# Patient Record
Sex: Female | Born: 1950 | Race: Black or African American | Hispanic: No | Smoking: Former smoker
Health system: Southern US, Community
[De-identification: ages and names within clinical notes are randomized; demographics above are authoritative.]

## PROBLEM LIST (undated history)

## (undated) DIAGNOSIS — E785 Hyperlipidemia, unspecified: Secondary | ICD-10-CM

## (undated) DIAGNOSIS — J42 Unspecified chronic bronchitis: Secondary | ICD-10-CM

## (undated) DIAGNOSIS — B1920 Unspecified viral hepatitis C without hepatic coma: Secondary | ICD-10-CM

## (undated) DIAGNOSIS — I10 Essential (primary) hypertension: Secondary | ICD-10-CM

## (undated) HISTORY — PX: COLONOSCOPY: SHX174

## (undated) HISTORY — PX: BACK SURGERY: SHX140

## (undated) HISTORY — DX: Hyperlipidemia, unspecified: E78.5

## (undated) HISTORY — DX: Essential (primary) hypertension: I10

## (undated) HISTORY — PX: POSTERIOR LUMBAR FUSION: SHX6036

## (undated) HISTORY — PX: LUMBAR SPINE SURGERY: SHX701

---
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
## 1988-10-10 HISTORY — PX: ABDOMINAL HYSTERECTOMY: SHX81

## 1988-10-10 HISTORY — PX: CHOLECYSTECTOMY: SHX55

## 2000-04-06 ENCOUNTER — Other Ambulatory Visit: Admission: RE | Admit: 2000-04-06 | Discharge: 2000-04-06 | Payer: Self-pay | Admitting: Internal Medicine

## 2000-04-06 ENCOUNTER — Encounter (INDEPENDENT_AMBULATORY_CARE_PROVIDER_SITE_OTHER): Payer: Self-pay

## 2000-05-04 ENCOUNTER — Encounter: Payer: Self-pay | Admitting: Family Medicine

## 2000-05-04 ENCOUNTER — Encounter: Admission: RE | Admit: 2000-05-04 | Discharge: 2000-05-04 | Payer: Self-pay | Admitting: Family Medicine

## 2003-08-28 ENCOUNTER — Encounter: Admission: RE | Admit: 2003-08-28 | Discharge: 2003-08-28 | Payer: Self-pay | Admitting: Emergency Medicine

## 2003-11-26 ENCOUNTER — Encounter: Admission: RE | Admit: 2003-11-26 | Discharge: 2003-11-26 | Payer: Self-pay | Admitting: Gastroenterology

## 2003-12-29 ENCOUNTER — Ambulatory Visit (HOSPITAL_COMMUNITY): Admission: RE | Admit: 2003-12-29 | Discharge: 2003-12-29 | Payer: Self-pay | Admitting: Orthopedic Surgery

## 2004-01-08 ENCOUNTER — Encounter: Admission: RE | Admit: 2004-01-08 | Discharge: 2004-01-08 | Payer: Self-pay | Admitting: Orthopedic Surgery

## 2004-01-21 ENCOUNTER — Encounter: Admission: RE | Admit: 2004-01-21 | Discharge: 2004-01-21 | Payer: Self-pay | Admitting: Orthopedic Surgery

## 2004-03-12 ENCOUNTER — Inpatient Hospital Stay (HOSPITAL_COMMUNITY): Admission: RE | Admit: 2004-03-12 | Discharge: 2004-03-15 | Payer: Self-pay | Admitting: Neurosurgery

## 2004-04-25 ENCOUNTER — Encounter: Admission: RE | Admit: 2004-04-25 | Discharge: 2004-04-25 | Payer: Self-pay | Admitting: Neurosurgery

## 2004-07-11 ENCOUNTER — Encounter: Admission: RE | Admit: 2004-07-11 | Discharge: 2004-07-11 | Payer: Self-pay | Admitting: Neurosurgery

## 2004-07-25 ENCOUNTER — Encounter: Admission: RE | Admit: 2004-07-25 | Discharge: 2004-07-25 | Payer: Self-pay | Admitting: Neurosurgery

## 2004-08-26 ENCOUNTER — Encounter: Admission: RE | Admit: 2004-08-26 | Discharge: 2004-08-26 | Payer: Self-pay | Admitting: Neurosurgery

## 2004-10-22 ENCOUNTER — Inpatient Hospital Stay (HOSPITAL_COMMUNITY): Admission: RE | Admit: 2004-10-22 | Discharge: 2004-10-25 | Payer: Self-pay | Admitting: Neurosurgery

## 2004-12-04 ENCOUNTER — Encounter: Admission: RE | Admit: 2004-12-04 | Discharge: 2004-12-04 | Payer: Self-pay | Admitting: Neurosurgery

## 2005-03-21 ENCOUNTER — Encounter: Admission: RE | Admit: 2005-03-21 | Discharge: 2005-03-21 | Payer: Self-pay | Admitting: Neurosurgery

## 2005-04-25 ENCOUNTER — Emergency Department (HOSPITAL_COMMUNITY): Admission: AD | Admit: 2005-04-25 | Discharge: 2005-04-25 | Payer: Self-pay | Admitting: Family Medicine

## 2005-06-04 ENCOUNTER — Encounter: Admission: RE | Admit: 2005-06-04 | Discharge: 2005-06-04 | Payer: Self-pay | Admitting: Neurosurgery

## 2005-11-09 ENCOUNTER — Encounter: Admission: RE | Admit: 2005-11-09 | Discharge: 2005-11-09 | Payer: Self-pay | Admitting: Family Medicine

## 2005-11-17 ENCOUNTER — Encounter: Admission: RE | Admit: 2005-11-17 | Discharge: 2005-11-17 | Payer: Self-pay | Admitting: Family Medicine

## 2006-08-16 ENCOUNTER — Emergency Department (HOSPITAL_COMMUNITY): Admission: EM | Admit: 2006-08-16 | Discharge: 2006-08-16 | Payer: Self-pay | Admitting: Family Medicine

## 2006-11-08 ENCOUNTER — Encounter: Admission: RE | Admit: 2006-11-08 | Discharge: 2006-11-08 | Payer: Self-pay | Admitting: Neurosurgery

## 2006-11-11 ENCOUNTER — Encounter: Admission: RE | Admit: 2006-11-11 | Discharge: 2006-11-11 | Payer: Self-pay | Admitting: Emergency Medicine

## 2006-11-12 ENCOUNTER — Encounter: Admission: RE | Admit: 2006-11-12 | Discharge: 2006-11-12 | Payer: Self-pay | Admitting: Gastroenterology

## 2007-01-04 ENCOUNTER — Ambulatory Visit: Payer: Self-pay | Admitting: Gastroenterology

## 2007-01-17 ENCOUNTER — Encounter (INDEPENDENT_AMBULATORY_CARE_PROVIDER_SITE_OTHER): Payer: Self-pay | Admitting: Interventional Radiology

## 2007-01-17 ENCOUNTER — Ambulatory Visit (HOSPITAL_COMMUNITY): Admission: RE | Admit: 2007-01-17 | Discharge: 2007-01-17 | Payer: Self-pay | Admitting: Gastroenterology

## 2007-11-14 ENCOUNTER — Encounter: Admission: RE | Admit: 2007-11-14 | Discharge: 2007-11-14 | Payer: Self-pay | Admitting: Emergency Medicine

## 2007-11-15 ENCOUNTER — Emergency Department (HOSPITAL_COMMUNITY): Admission: EM | Admit: 2007-11-15 | Discharge: 2007-11-15 | Payer: Self-pay | Admitting: Emergency Medicine

## 2008-05-04 ENCOUNTER — Ambulatory Visit (HOSPITAL_COMMUNITY): Admission: RE | Admit: 2008-05-04 | Discharge: 2008-05-04 | Payer: Self-pay | Admitting: Specialist

## 2008-11-07 ENCOUNTER — Encounter: Admission: RE | Admit: 2008-11-07 | Discharge: 2008-11-07 | Payer: Self-pay | Admitting: Neurosurgery

## 2008-11-20 ENCOUNTER — Encounter: Admission: RE | Admit: 2008-11-20 | Discharge: 2008-11-20 | Payer: Self-pay | Admitting: Emergency Medicine

## 2008-12-05 ENCOUNTER — Inpatient Hospital Stay (HOSPITAL_COMMUNITY): Admission: RE | Admit: 2008-12-05 | Discharge: 2008-12-09 | Payer: Self-pay | Admitting: Neurosurgery

## 2009-02-12 ENCOUNTER — Encounter: Admission: RE | Admit: 2009-02-12 | Discharge: 2009-02-12 | Payer: Self-pay | Admitting: Neurosurgery

## 2009-04-16 ENCOUNTER — Encounter: Admission: RE | Admit: 2009-04-16 | Discharge: 2009-04-16 | Payer: Self-pay | Admitting: Neurosurgery

## 2009-07-16 ENCOUNTER — Encounter: Admission: RE | Admit: 2009-07-16 | Discharge: 2009-07-16 | Payer: Self-pay | Admitting: Neurosurgery

## 2009-08-06 ENCOUNTER — Encounter: Admission: RE | Admit: 2009-08-06 | Discharge: 2009-08-06 | Payer: Self-pay | Admitting: Neurosurgery

## 2009-09-04 ENCOUNTER — Encounter (HOSPITAL_COMMUNITY): Admission: RE | Admit: 2009-09-04 | Discharge: 2009-11-05 | Payer: Self-pay | Admitting: Neurosurgery

## 2009-12-18 ENCOUNTER — Encounter: Admission: RE | Admit: 2009-12-18 | Discharge: 2009-12-18 | Payer: Self-pay | Admitting: Emergency Medicine

## 2010-01-03 ENCOUNTER — Encounter: Admission: RE | Admit: 2010-01-03 | Discharge: 2010-01-03 | Payer: Self-pay | Admitting: Neurosurgery

## 2010-01-31 ENCOUNTER — Encounter
Admission: RE | Admit: 2010-01-31 | Discharge: 2010-01-31 | Payer: Self-pay | Source: Home / Self Care | Attending: Neurosurgery | Admitting: Neurosurgery

## 2010-02-14 LAB — HEPATIC FUNCTION PANEL
ALT: 63 U/L — ABNORMAL HIGH (ref 0–35)
AST: 65 U/L — ABNORMAL HIGH (ref 0–37)
Albumin: 4 g/dL (ref 3.5–5.2)
Alkaline Phosphatase: 130 U/L — ABNORMAL HIGH (ref 39–117)
Bilirubin, Direct: 0.2 mg/dL (ref 0.0–0.3)
Indirect Bilirubin: 0.3 mg/dL (ref 0.3–0.9)
Total Bilirubin: 0.5 mg/dL (ref 0.3–1.2)
Total Protein: 7.4 g/dL (ref 6.0–8.3)

## 2010-02-14 LAB — CBC
HCT: 39.4 % (ref 36.0–46.0)
Hemoglobin: 13.4 g/dL (ref 12.0–15.0)
MCH: 31.7 pg (ref 26.0–34.0)
MCHC: 34 g/dL (ref 30.0–36.0)
MCV: 93.1 fL (ref 78.0–100.0)
Platelets: 247 10*3/uL (ref 150–400)
RBC: 4.23 MIL/uL (ref 3.87–5.11)
RDW: 12.3 % (ref 11.5–15.5)
WBC: 8 10*3/uL (ref 4.0–10.5)

## 2010-02-14 LAB — BASIC METABOLIC PANEL
BUN: 16 mg/dL (ref 6–23)
CO2: 31 mEq/L (ref 19–32)
Calcium: 9.9 mg/dL (ref 8.4–10.5)
Chloride: 104 mEq/L (ref 96–112)
Creatinine, Ser: 1.16 mg/dL (ref 0.4–1.2)
GFR calc Af Amer: 58 mL/min — ABNORMAL LOW (ref >60)
GFR calc non Af Amer: 48 mL/min — ABNORMAL LOW (ref >60)
Glucose, Bld: 90 mg/dL (ref 70–99)
Potassium: 4.2 mEq/L (ref 3.5–5.1)
Sodium: 141 mEq/L (ref 135–145)

## 2010-02-19 ENCOUNTER — Inpatient Hospital Stay (HOSPITAL_COMMUNITY)
Admission: RE | Admit: 2010-02-19 | Discharge: 2010-02-20 | Payer: Self-pay | Source: Home / Self Care | Attending: Neurosurgery | Admitting: Neurosurgery

## 2010-03-01 ENCOUNTER — Encounter: Payer: Self-pay | Admitting: Family Medicine

## 2010-03-01 ENCOUNTER — Encounter: Payer: Self-pay | Admitting: Orthopedic Surgery

## 2010-03-02 ENCOUNTER — Encounter: Payer: Self-pay | Admitting: Neurosurgery

## 2010-03-06 NOTE — Op Note (Addendum)
[name], [name]               ACCOUNT NO.:  [id number]  MEDICAL RECORD NO.:  [id number]          PATIENT TYPE:  INP  LOCATION:  3002                         FACILITY:  [facility]  PHYSICIAN:  Donalee Citrin, M.D.        DATE OF BIRTH:  June 09, 1950  DATE OF PROCEDURE:  02/19/2010 DATE OF DISCHARGE:                              OPERATIVE REPORT   PREOPERATIVE DIAGNOSIS:  Pseudoarthrosis, L2-3.  PROCEDURES:  Exploration of fusion, removal of hardware L2-L4, revision of fusion with replacement of L2 pedicle screws using 6.35 Legacy pedicle screw system at L2, and posterolateral arthrodesis using Actifuse and BMP at L2-3.  SURGEON:  Donalee Citrin, M.D.  ANESTHESIA:  General endotracheal.  HISTORY OF PRESENT ILLNESS:  The patient is a very pleasant 60 year old female who has had previous 4-1 and subsequent 2-4 fusion.  Did very well, however, over the last several weeks to months had progressing worsening back pain.  Subsequent CT scan showed progressive loosening of the L2 pedicle screws, revealed to have small amount of bridging bone centrally in the disk space at L2-3, but a fair amount of nonunion throughout the interspace as well as posterolaterally at L2-3.  At L3-4 appeared to be extensive amount of interbody fusion.  So, the patient was recommended after failure of conservative treatment, re-exploration of fusion, removal of  hardware, and placement of Legacy pedicle screws, and redo posterolateral fusion.  Risks and benefits of the operation were explained to the patient.  She understood and agreed to proceed forward.  PROCEDURE IN DETAIL:  The patient was brought to the OR, was induced under general anesthesia, positioned prone on the Wilson frame.  Back was prepped and draped in the usual sterile fashion.  Her old incision was opened up.  The hardware was immediately identified and the cross- link was removed.  The rods were then subsequently removed.  The fusion was inspected.   L3-4 appeared to be solid both posterolaterally and appeared to move as a unit, however, L2-3 did appear to be hypermobile and pseudoarthrosed.  So, the L2 screws were removed, which were loose. The pedicles were probed and felt to be in good position.  The posterolateral space, the bridging bone between L3-4 was identified. The L3 TP was identified bilaterally and the L2 TP was identified bilaterally.  After copious irrigation, aggressive decortication was carried out in T-piece at L2-L3 as well as tying into the posterolateral bone from L4.  The Actifuse was then packed along the TPs from L2 to L3 and then into the posterolateral bone at L3-4 as well as BMP sponge extra small kit.  Then, the L2 screws were replaced with bigger screws, and what was removed was 6 x 45 and was placed with 7 x 45 and then 40- mm rods were placed.  The screws were left at L4 and this fusion was felt to be solid, and then the nuts were tightened down.  A large Hemovac drain was placed.  The wound was then closed in layers with interrupted Vicryl in a running 4-0 subcuticular.  Benzoin and Steri-Strips were applied.  The patient went to  the recovery room in stable condition.  At the end of the case, instrument and sponge count was correct.          ______________________________ Donalee Citrin, M.D.     GC/MEDQ  D:  02/19/2010  T:  02/20/2010  Job:  [id number]  Electronically Signed by Donalee Citrin M.D. on 03/06/2010 05:38:47 AM

## 2010-04-01 ENCOUNTER — Ambulatory Visit
Admission: RE | Admit: 2010-04-01 | Discharge: 2010-04-01 | Disposition: A | Discharge status: Home / Self Care | Payer: Medicare Other | Source: Ambulatory Visit | Attending: Neurosurgery | Admitting: Neurosurgery

## 2010-04-01 ENCOUNTER — Other Ambulatory Visit: Payer: Self-pay | Admitting: Neurosurgery

## 2010-04-01 DIAGNOSIS — M549 Dorsalgia, unspecified: Secondary | ICD-10-CM

## 2010-04-03 NOTE — Discharge Summary (Signed)
  [name], [name]               ACCOUNT NO.:  [id number]  MEDICAL RECORD NO.:  [id number]          PATIENT TYPE:  INP  LOCATION:  3002                         FACILITY:  [facility]  PHYSICIAN:  Donalee Citrin, M.D.        DATE OF BIRTH:  December 20, 1950  DATE OF ADMISSION:  02/19/2010 DATE OF DISCHARGE:  02/20/2010                              DISCHARGE SUMMARY   ADMITTING DIAGNOSIS:  Pseudoarthrosis, L2-3.  DISCHARGE DIAGNOSIS:  Pseudoarthrosis, L2-3.  HOSPITAL COURSE:  The patient was admitted through the short-stay, was taken to the operative room, underwent a revision of fusion, removal of hardware, exploration of fusion, and redo posterolateral fusion at L2- L3.  Postoperatively, the patient did very well and recovering on the floor.  On the floor, the patient convalesced well, was able to get up and mobilize that evening and by the next day, the patient was stable to be discharged home.  She was discharged and scheduled to follow up in approximately 2 weeks.  At the time of discharge, she was ambulating, voiding spontaneously, tolerating a regular diet, and pain was well controlled on pills.          ______________________________ Donalee Citrin, M.D.     GC/MEDQ  D:  03/20/2010  T:  03/20/2010  Job:  [id number]  Electronically Signed by Donalee Citrin M.D. on 04/03/2010 07:02:13 AM

## 2010-05-13 ENCOUNTER — Ambulatory Visit
Admission: RE | Admit: 2010-05-13 | Discharge: 2010-05-13 | Disposition: A | Discharge status: Home / Self Care | Payer: Medicare Other | Source: Ambulatory Visit | Attending: Neurosurgery | Admitting: Neurosurgery

## 2010-05-13 ENCOUNTER — Other Ambulatory Visit: Payer: Self-pay | Admitting: Neurosurgery

## 2010-05-13 DIAGNOSIS — M545 Low back pain, unspecified: Secondary | ICD-10-CM

## 2010-05-13 DIAGNOSIS — M5126 Other intervertebral disc displacement, lumbar region: Secondary | ICD-10-CM

## 2010-05-13 DIAGNOSIS — M47817 Spondylosis without myelopathy or radiculopathy, lumbosacral region: Secondary | ICD-10-CM

## 2010-05-13 DIAGNOSIS — M5137 Other intervertebral disc degeneration, lumbosacral region: Secondary | ICD-10-CM

## 2010-05-15 LAB — TYPE AND SCREEN: Antibody Screen: NEGATIVE

## 2010-05-15 LAB — BASIC METABOLIC PANEL
CO2: 27 mEq/L (ref 19–32)
Calcium: 9.8 mg/dL (ref 8.4–10.5)
Chloride: 107 mEq/L (ref 96–112)
GFR calc Af Amer: >60 mL/min (ref >60)
Potassium: 4.2 mEq/L (ref 3.5–5.1)
Sodium: 141 mEq/L (ref 135–145)

## 2010-05-15 LAB — CBC
Hemoglobin: 14 g/dL (ref 12.0–15.0)
MCHC: 34.9 g/dL (ref 30.0–36.0)
RBC: 4.28 MIL/uL (ref 3.87–5.11)

## 2010-06-27 NOTE — Discharge Summary (Signed)
NAMELUCELY, [name]               ACCOUNT NO.:  [id number]   MEDICAL RECORD NO.:  [id number]          PATIENT TYPE:  INP   LOCATION:  3011                         FACILITY:  [facility]   PHYSICIAN:  Donalee Citrin, M.D.        DATE OF BIRTH:  04/26/1950   DATE OF ADMISSION:  03/12/2004  DATE OF DISCHARGE:  03/15/2004                           DISCHARGE SUMMARY - REFERRING   ADMISSION DIAGNOSIS:  Severe lumbar spinal stenosis L4-5, L5-S1.   PROCEDURE:  Decompressive laminectomy L4-5, L5-S1.   HOSPITAL COURSE:  Patient is a very pleasant 60 year old female who was  admitted as an __________ went to the operating room and underwent the  aforementioned procedure.  Postoperatively the patient did very well, went  to the recovery room and then the floor.  On the floor, patient was awake  and alert, had complete resolution of her preoperative leg pain.  She was  afebrile.  She did have an intraoperative spinal fluid leak so she was kept  flat on bedrest for the first two days in the hospital.  Then by day #3, she  was progressively mobilized.  She ambulated well.  She was voiding  spontaneously.  Pain was well controlled with pills and she was able to be  discharged home.      GC/MEDQ  D:  05/07/2004  T:  05/07/2004  Job:  [id number]

## 2010-06-27 NOTE — Op Note (Signed)
NAMEBASSHEVA, FLURY               ACCOUNT NO.:  [id number]   MEDICAL RECORD NO.:  [id number]          PATIENT TYPE:  OIB   LOCATION:  2550                         FACILITY:  [facility]   PHYSICIAN:  Donalee Citrin, M.D.        DATE OF BIRTH:  03-09-1950   DATE OF PROCEDURE:  03/12/2004  DATE OF DISCHARGE:                                 OPERATIVE REPORT   PREOPERATIVE DIAGNOSIS:  Severe lumbar spinal stenosis, L4-5, L5-S1, with  ruptured disk to the right at L4-5 and to the left at L5-S1.  The patient  had preoperative right L4, L5 radiculopathy and left S1 radiculopathy.   PROCEDURES:  1.  Lumbar laminectomy and microdiskectomy, L4-5 right with microscopic      dissection of the right L5 nerve root and L5 nerve root and microscopic      diskectomy.  2.  Laminectomy and microdiskectomy, L5-S1 left, with microscopic dissection      of the left S1 nerve root and microscopic diskectomy.   SURGEON:  Donalee Citrin, M.D.   ASSISTANT:  Tia Alert, M.D.   ANESTHESIA:  General endotracheal.   HISTORY OF PRESENT ILLNESS:  The patient is a very pleasant 60 year old  female who has had longstanding back and bilateral leg pain, predominantly  in the right hip and then down the left leg to the outside of her foot and  the bottom of her foot.  The patient had failed all forms of conservative  treatment, including physical therapy, steroid injections and time.  The  patient's lumbar MRI showed severe lumbar spinal stenosis predominantly from  a ruptured disk and facet arthropathy at L4-5 from the right and L5-S1 on  the left.  The patient was recommended a decompressive laminectomy and  diskectomy at each one of these levels.  I extensively went over the risks  and benefits of surgery with her.  She understood and agreed to proceed  forward.   The patient was brought into the OR, was induced under general anesthesia,  positioned prone on the Wilson frame.  The back was prepped and draped in  the  usual sterile fashion.  A preoperative x-ray localized the L5-S1 disk  space.  After infiltration of 10 mL of lidocaine with epinephrine, a midline  incision was made and Bovie electrocautery was used to take it down through  the subcutaneous tissues and subperiosteal dissection carried out on the  lamina of L5 and S1 on the left side as well as L4-5 on the right.  Intraoperative x-ray confirmed localization of the L4-5 disk space, so at  first attention was taken to the L5-S1 disk space.  The inferior aspect the  lamina of L5, medial aspect of the facet complex and the superior aspect of  the lamina of S1 was removed in a piecemeal fashion, exposing the ligamentum  flavum.  The ligamentum flavum was noted to be markedly hypertrophied, and  the thecal sac was compressed in an hourglass format from severe facet  arthropathy and ligamentous hypertrophy as well as a large disk compressing  the proximal left S1 nerve  root, so the S1 nerve root was dissected free off  the ligament, which was removed in piecemeal fashion, and then dissected  free off the disk space with a 4 Penfield.  Then the foraminotomy of the  left S1 nerve root was opened up with a 3 mm Kerrison punch.  Then Gelfoam  was placed at this time.  Then on the right side at L4-5, again the inferior  aspect of the lamina of L4, the medial aspect of the facet complex and the  superior aspect of the lamina of L5 was removed.  Then the ligamentum  flavum, which was noted to be markedly hypertrophied, was also underbitten  and dissected free.  While dissecting off and removing the ligamentum flavum  in piecemeal fashion, a small split-thickness dural tear was noted  underneath the lamina of L4 and the superior aspect of the facet complex.  This was packed away with Gelfoam and patty.  Then the remainder of the  ligament was removed.  The undersurface of the facet was underbitten.  The  L5 nerve root was markedly compressed from a large  disk rupture at this  level that extended preforaminally and out the other foramen.  This was all  dissected free and reflected medially with a D'Errico nerve root retractor.  Then under microscopic illumination, the L5 foramen was opened up and the  disk was incised.  The large fragments of disk were removed from the central  compartment as well as out laterally in the foramen, and this completely  collapsed the annulus and the disk space was cleaned out.  At the end of the  diskectomy, both the L4 and the L5 root were examined and explored and noted  to have no further stenosis.  Then Gelfoam was placed in this and attention  was taken back with the microscope at L5-S1 on the left.  Using the  microscopic dissection technique, the S1 nerve root was dissected off a  large fragment of disk.  This was reflected medially, epidural veins  coagulated.  Annulotomy was made and was radically cleaned out, _________  removed in the central compartment, and this was cleaned out.  At the end of  the diskectomy, the medial, cephalad, caudad and mediolateral thecal sac was  explored and no further stenosis appreciated.  The wound was copiously  irrigated again, meticulous hemostasis was maintained, and Gelfoam was  placed in this interspace.  Then on the right at L4-5, a small piece of  Duragen was overlaid over the split-thickness tear and Tisseel was applied.  Gelfoam was overlaid here as well and then the retractors were removed, the  scope was removed.  The wound was closed in the usual fashion with 0  interrupted in the fascia, 2-0 interrupted on subcutaneous tissue and a  running nylon 4-0 subcuticular.  Benzoin and Steri-Strips were applied.  The  patient went to the recovery room in stable condition.  At the end of the  case, needle and sponge counts were correct.      GC/MEDQ  D:  03/12/2004  T:  03/12/2004  Job:  [id number]

## 2010-06-27 NOTE — Discharge Summary (Signed)
NAMEBRAYLINN, [name]               ACCOUNT NO.:  [id number]   MEDICAL RECORD NO.:  [id number]          PATIENT TYPE:  INP   LOCATION:  3034                         FACILITY:  [facility]   PHYSICIAN:  Donalee Citrin, M.D.        DATE OF BIRTH:  1950/07/09   DATE OF ADMISSION:  10/22/2004  DATE OF DISCHARGE:  10/25/2004                                 DISCHARGE SUMMARY   ADMISSION DIAGNOSIS:  Failed back with degenerative disk disease and  recurrent disk herniation, L4-5, L5-S1.   PROCEDURE DURING THIS HOSPITALIZATION:  Redo decompressive laminectomies and  posterior lumbar interbody fusion, L4-5 and L5-S1.  Surgeon was Donalee Citrin,  M.D., and assistant was Tia Alert, M.D.   HOSPITAL COURSE:  The patient was admitted and immediately underwent to the  operating room and underwent the aforementioned procedure.  Postop the  patient did very well, coming to the floor, on the floor was afebrile with  complete resolution of preoperative leg pain.  The patient progressively  mobilized with physical and occupational therapy.  Her wound remained dry.  Pain became transitioned well on p.o. pain medicine as opposed to IV.  Her  Hemovac was able to be discontinued.  The patient was subsequently  discharged home on day 3 and scheduled to follow up in two weeks.           ______________________________  Donalee Citrin, M.D.     GC/MEDQ  D:  12/24/2004  T:  12/24/2004  Job:  [id number]

## 2010-06-27 NOTE — Op Note (Signed)
[name], [name]               ACCOUNT NO.:  [id number]   MEDICAL RECORD NO.:  [id number]          PATIENT TYPE:  INP   LOCATION:  2858                         FACILITY:  [facility]   PHYSICIAN:  Donalee Citrin, M.D.        DATE OF BIRTH:  1950-11-04   DATE OF PROCEDURE:  10/22/2004  DATE OF DISCHARGE:                                 OPERATIVE REPORT   PREOPERATIVE DIAGNOSIS:  Severe lumbar spinal stenosis, L4-5, L5-S1, large  ruptured disk L5-S1, degenerative disk disease L4-5 and L5-S1 with bilateral  right greater than left L5 and S1 radiculopathies.   POSTOPERATIVE DIAGNOSIS:  Severe lumbar spinal stenosis, L4-5, L5-S1, large  ruptured disk L5-S1, degenerative disk disease L4-5 and L5-S1 with bilateral  right greater than left L5 and S1 radiculopathies.   OPERATION PERFORMED:  Redo decompressive laminectomy, L4-5.  Redo  decompressive laminectomy, L5-S1.  Posterior lumbar interbody fusion L4-5,  L5-S1 using 10 x 26 mm tangent allograft wedges, one per level and 10 x 20  mm Telemon PEEK cages, one per level packed with autologous bone graft and  DBX bone substitute.  Posterolateral arthrodesis, L5-S1.  Pedicle screw  fixation, L5-S1 using the 6.35 Legacy pedicle screw system placing medium  Hemovac drain.   SURGEON:  Donalee Citrin, M.D.   ASSISTANT:  Tia Alert, MD   ANESTHESIA:  General endotracheal.   INDICATIONS FOR PROCEDURE:  The patient is a very pleasant 61 year old  female who underwent unilateral two-level laminectomies, one on the right L4-  5 and one on the left at L5-S1 over a year ago and the patient developed  recurrent back and leg pain, back much greater than leg pain, mechanical in  nature, worse with going from lying to sitting position or standing  position.  Preoperative imaging showed spinal stenosis with a ruptured disk  at L5-S1 on the right and degenerative disk disease at L5-S1 as well as  degenerative disk disease at L4-5 with spinal stenosis and  degenerative  collapse.  The patient has failed epidural steroid injection, anti-  inflammatories and pain management.  The patient was recommended  decompression and stabilization procedure.  Risks and benefits went over  with the patient.  She understands and agrees to proceed forward.   DESCRIPTION OF PROCEDURE:  The patient was brought to the operating room and  was induced under general anesthesia, prepped and draped in the usual  sterile fashion.  Her old incision was opened up and extended  cephalocaudally.  Then Bovie electrocautery was used to dissect through the  scar and subperiosteal dissection carried out to the lamina of L3, 4, 5, and  S1 bilaterally.  Transverse processes of L4, 5 and S1 were also dissected  free and packed away and the residual spinous process at L4, residual  spinous process at L5 was all removed with Leksell rongeur and first  attention taken on the left side at L4-5 where she had not had a previous  laminotomy and using a 3 mm Kerrison punch and a combination of 3-4, the  laminectomy and medial facetectomies were completed decompressing the  4 and  the 5 root on the right, then using dental dissectors and a 4 Penfield, the  scar tissue was dissected free from L4-5 on the left was decompressed and  using the dissectors, the scar tissue was dissected away from the right side  and noted to be markedly stenotic. The facet complex was noted to be causing  hourglass compression of the thecal sac on the right side and this was all  teased away with dental dissectors in piecemeal fashion until the complete  medial facet had been removed.  Disk space was identified and the 4 and 5  roots on the right side were identified at L4-5.  Then attention taken to L5-  S36first starting on the patient's right side, the laminectomy and medial  facetectomies were performed on the right at L5-S1 where she had not had  previous laminectomy.  This was all viable, and the 1 root  was identified  flush against the pedicle.  Medial facet was completely removed.  Then  dissecting the scar tissue and the facet complex well away from L5-S1 on the  left, again noted marked stenosis from medial overgrowth, residual facet  hypertrophy at this level after it was dissected free and removed in  piecemeal fashion and the scar tissue was freed up both the 4, 5 and S1  roots bilaterally  were widely patent.  It was noted to be marked collapse  at each disk space causing compression of the L4 and L5 nerve roots  respectively, so attention was first taken to pedicle screw placement. Using  high speed drill, pilot holes were drilled at the L4 pedicle on the left,  cannulated with the awl, probed within the pedicle as well as from within  the canal confirming no medial lateral breech, tapped with a 55 tap and a 6  x 45 screw inserted L4 on the left.  Fluoroscopy confirmed good trajectory.  Each pedicle was probed at each step along the way from within the pedicle  and canal confirming competency.  Then the screw was inserted.  The L5 and  S1 screws were inserted in similar fashion with 6 x 40 at L5 and 6 x 35 at  S1 on the left and the right pedicle screw was inserted in similar fashion  also.  All pedicles were noted to be competent confirming no medial lateral  breech at each step along the way.  Attention was taken to the interbody  work first at L5-S1 on the right.  A D'Errico retractor was used to reflect  the right S1 nerve root medially.  Annulotomy was made. The large ruptured  disk at L5-S1 on the right was removed in piecemeal fashion with downgoing  Epstein curette, pituitary rongeurs.  Then the end plates were scraped and  first an 8 distractor was inserted. This had good apposition of the end  plates; however, appeared to be small.  This was sized up to a 10 and  connected at positioning end plate.  Then using a D'Errico, the left S1 nerve root was dissected free off  scar tissue and pedicle was mobilized.  Annulotomy was made.  Disk spaces were adequately cleaned out. Size 10  cutter and chisel were used to prepare the end plates, then a Telemon cage  was inserted on the left side which was packed with autologous bone graft  and DBX bone substitute.  Fluoroscopy confirmed each step along the way with  chisel __________  Then attention taken to  the right side.  Again, the end  plates were scraped in similar fashion.  Locally harvested autograft packed  against the left side allograft and the right-sided tangent bone graft was  inserted.  Then attention turned to L4-5.  This procedure was repeated;  however, reversed.  The Telemon was inserted on the right side and the  tangent on the left side.  Again, there was noted to be large recurrent disk  on the right at L4-5 and this was all teased away with downgoing Epstein  curette and pituitary rongeurs and locally harvested to allograft and  Telemon cages and after all four grafts inserted both levels, fluoroscopy  confirmed each step along the way, the wound was copiously irrigated.  Aggressive decortication was carried out in the transverse processes and  lateral gutters.  The remainder of the locally harvested autograft with DBX  bone substitute was packed in the lateral gutters.  Then the rods were sized  and inserted.  __________  at S1.  The L5 pedicle screws compressed against  S1 and the L4 compressed against the L5.  Then a __________  was inserted.  Postoperative fluoroscopy confirmed good position of rods, screws and bone  graft.  Then a medium Hemovac drain was placed.  Gelfoam was overlaid on top  of the dura. The neural foramina were re-explored and noted to be widely  patent. The muscle and fascia were reapproximated in layers with interrupted  Vicryl and skin with running 4-0 subcuticular.  Benzoin and Steri-Strips  applied.  The patient was then transferred to the recovery room in stable   condition.  At the end of the case all sponge, needle and instrument counts  were correct.           ______________________________  Donalee Citrin, M.D.     GC/MEDQ  D:  10/22/2004  T:  10/22/2004  Job:  [id number]

## 2010-07-16 ENCOUNTER — Other Ambulatory Visit: Payer: Self-pay | Admitting: Physical Medicine and Rehabilitation

## 2010-07-16 DIAGNOSIS — M549 Dorsalgia, unspecified: Secondary | ICD-10-CM

## 2010-07-17 ENCOUNTER — Ambulatory Visit
Admission: RE | Admit: 2010-07-17 | Discharge: 2010-07-17 | Disposition: A | Discharge status: Home / Self Care | Payer: Medicare Other | Source: Ambulatory Visit | Attending: Physical Medicine and Rehabilitation | Admitting: Physical Medicine and Rehabilitation

## 2010-07-17 ENCOUNTER — Other Ambulatory Visit: Payer: Self-pay | Admitting: Physical Medicine and Rehabilitation

## 2010-07-17 DIAGNOSIS — M549 Dorsalgia, unspecified: Secondary | ICD-10-CM

## 2010-07-31 ENCOUNTER — Ambulatory Visit
Admission: RE | Admit: 2010-07-31 | Discharge: 2010-07-31 | Disposition: A | Discharge status: Home / Self Care | Payer: Medicare Other | Source: Ambulatory Visit | Attending: Neurosurgery | Admitting: Neurosurgery

## 2010-07-31 ENCOUNTER — Other Ambulatory Visit: Payer: Self-pay | Admitting: Neurosurgery

## 2010-07-31 DIAGNOSIS — M545 Low back pain: Secondary | ICD-10-CM

## 2010-07-31 DIAGNOSIS — M5137 Other intervertebral disc degeneration, lumbosacral region: Secondary | ICD-10-CM

## 2010-08-04 ENCOUNTER — Ambulatory Visit
Admission: RE | Admit: 2010-08-04 | Discharge: 2010-08-04 | Disposition: A | Discharge status: Home / Self Care | Payer: Medicare Other | Source: Ambulatory Visit | Attending: Neurosurgery | Admitting: Neurosurgery

## 2010-08-04 DIAGNOSIS — M545 Low back pain: Secondary | ICD-10-CM

## 2010-08-04 MED ORDER — GADOBENATE DIMEGLUMINE 529 MG/ML IV SOLN
17.0000 mL | Freq: Once | INTRAVENOUS | Status: AC | PRN
  Administered 2010-08-04: 17 mL via INTRAVENOUS

## 2010-08-21 ENCOUNTER — Ambulatory Visit (INDEPENDENT_AMBULATORY_CARE_PROVIDER_SITE_OTHER): Payer: Medicare Other | Admitting: Gastroenterology

## 2010-08-21 VITALS — BP 166/93 | HR 74 | Temp 97.9°F | Ht 66.0 in | Wt 182.0 lb

## 2010-08-21 DIAGNOSIS — B182 Chronic viral hepatitis C: Secondary | ICD-10-CM

## 2010-08-28 NOTE — Progress Notes (Addendum)
NAME:  [name], [name]  MR#:  [id number]      DATE:  08/21/2010  DOB:  [birth year]    cc: Referring Physician:  Viviann Spare A. Cleta Alberts, MD, [facility], [address] [place], [place] [address], Fax [phone]   Primary Care Physician:  Same.  Consulting Physician:   1.  Jillyn Hidden P. Roney Jaffe., MD, [facility], [address] [place], [address], [place], [place] [address], Fax [phone] 2.  Vida Rigger, MD, [facility], [address] [place],  [place], [place]  [address],  [place] [phone]    REASON FOR REFERRAL:  Genotype 1b hepatitis C.   History:  The patient is a 60 year old woman who I have been asked to see in consultation by Dr. Cleta Alberts regarding her genotype 1b hepatitis C.  The patient was previously seen in our office on 01/04/2007 for her hepatitis C. She had been diagnosed in 2001 and treated by Dr. Vida Rigger, at [facility] with a combination of Pegintron and  ribavirin around 2004. As previously noted in the 01/04/2007 note, we did not receive sufficient documentation to determine what her response truly was, but a graph of the HCV RNA ordered under Dr.  Marlane Hatcher name with a viral loads on 05/07/1997, 05/20/2001, and 12/02/2006, showed no significant change in the viral load between 05/20/2001 and 12/02/2006, so that if she was treated somewhere around  2004, either viral loads were not done or perhaps she was a nonresponder. The only description of the treatment was a clinic note 11/08/2006, which did not indicate how long she took the treatment,  but indicates she had significant side effects on therapy, and she "unfortunately, she recurred when she stopped it." When seen on 01/04/2007, Dr. Foy Guadalajara wanted to stage her with a biopsy as she had  never been biopsied in the past. This was obtained on 01/17/2007, which showed grade 2 stage III disease with minimal iron deposition and mild steatosis. Thereafter, it appears that the patient was lost  to follow  up. She indicates that she had a number of back problems, which is a long-standing problem for which precluded her from coming back, in addition to not being able to get through to make an  appointment.  There are currently no symptoms referable to her history of hepatitis C nor are there symptoms to suggest cryoglobulin mediated or decompensated liver disease.  With respect to risk factors for liver disease, she denies any significant alcohol use over the course of her lifetime. Her last drink of alcohol was around 1990. There is a history of marijuana use  30-40 years ago but no history of intravenous or intranasal drug use. There is no history of tattoos or blood transfusion prior to 1992. There may be unsterile body piercing. Dr. 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 There is no family history of  liver disease. She was previously hepatitis A immune and believes she received hepatitis B vaccination through her work.    PAST MEDICAL HISTORY:  Otherwise nonsignificant. There is no diabetes, dyslipidemia, coronary artery disease, hypertension, or thyroid disease. There is no lung  disease. She has had a colonoscopy in the past for screening, according to the note of 01/04/2007, showing "small benign polyps."   PAST SURGICAL HISTORY:  Cholecystectomy, several back surgeries under the care of Dr. Glee Arvin. She reports her last back surgery occurred after she was last seen by [place]  in 2010 and 2011.   Past psychiatric history:  Denies.   CURRENT MEDICATIONS:  Hydrocodone/acetaminophen 10/325 mg 1-2 q. 4-6. p.r.n. She may take at most 1 a day.   SOCIAL HISTORY:  Denies.    Habits:  Smoking 1 pack cigarettes per day. Alcohol as above.   FAMILY HISTORY:  As above.   SOCIAL HISTORY:  Has 2 children. Currently not working and came with her niece today. She is currently married and is currently on disability  because of the back pain.   REVIEW OF SYSTEMS:  All 10 systems reviewed today with the patient on the review of systems form, which was signed and placed in the chart. Her CES-D was zero.   PHYSICAL EXAMINATION:  Constitutional: Appeared stated age without significant bitemporal wasting. Vital signs: Height 66 inches, weight 182 pounds, blood pressure 166/93, pulse 74, temperature 97.9 Fahrenheit.  Ears, nose,  mouth and throat:  Unremarkable oropharynx.  No thyromegaly or neck masses.  Chest:  Resonant to percussion.  Clear to auscultation.  Cardiovascular:  Heart sounds normal S1, S2 without murmurs or rubs.   There is no peripheral edema.  Abdominal:  Normal bowel sounds.  No masses or tenderness.  I could not appreciate a liver edge or spleen tip.  I could not appreciate any hernias.  Lymphatics:  No cervical or  inguinal lymphadenopathy.  Central Nervous System:  No asterixis or focal neurologic findings.  Dermatologic:  Anicteric without palmar  erythema or spider angiomata.  Eyes:  Anicteric sclerae.  Pupils are equal and reactive to light.   Laboratories:  Most recent labs from Dr. Lesle Chris, 06/09/2010, her viral load was 8,091,000 international units per mL, genotype 1b. INR 0.94, ALT 59, AST 58, ALP 130, total bilirubin 0.4, which the direct was 0.1,  albumin 4.5, globulins were 3.5. CBC, white count 7.5, hemoglobin 12.8, MCV 92.4, platelet count 234.   ASSESSMENT:  The patient is a 60 year old woman with a history genotype 1b HCV, with a liver biopsy on 01/17/2007, showing grade 2 stage III disease. Treatment experience of exact response is not known. Given that it may  not be possible to discover what her response was, she could be treated as a null responder needing up to 48 weeks of treatment using a combination of pegylated interferon, ribavirin, and telaprevir.  In my discussion today with the patient and her niece who accompanied her, we discussed the results of her liver biopsy and  genotyping. We discussed treatment with  PEG interferon, ribavirin, and telaprevir.   I reviewed the specific systems, constitutional, psychiatric side effects of therapy. I have explained to her that she would be back on a weekly basis for monitoring of the lab tests in addition to bimonthly visits with a physician for at least 2 months, and will see how things progress. I reviewed the specific systems, constitutional, psychiatric side effects of therapy, our treatment protocol and its success rates.  I also discussed the risk of contagion.  I discussed the possibility of participating in clinical trials should the patient be interested. I have explained to her that participation is completely voluntary and the side effects and the efficacy of these  drugs is not completely known, hence they are in trials. She was willing to take a phone call from the coordinators regarding participation.   PLAN:  1. Hepatitis B immunization completed in the past. Because her titer was negative in the past we will give her a booster today and remeasure the hepatitis  B surface antibody in the next few months. 2. Hepatitis A immune. 3. Standard labs today. 4. Check IL 28 B. 5. I will pass her name along to the research coordinators at [facility] to see if she is eligible for any trials. 6. I will fax Dr. Marlane Hatcher office at [facility] to see if they have any specific records regarding her on treatment progress. 7. If she is not a candidate for clinical trial, will do the prior authorizations for medications and have her brought back for medication teaching and start on combination Pegasys, ribavirin, and telaprevir.            Brooke Dare, MD  ADDENDUM:  My fax to request old records from Dr [facility] office only yielded the liver path report from 2008, which we have, and a colon biopsy path report but no records relevant to her treatment.   I suspect we will never get the appropriate records and will need to  consider her a null responder.  IL28B TT.   ADDENDUM:  Received and reviewed on 09/11/10 a more complete chart including a treatment flowsheet.  She was treated with PegIntron 120 mcg and RBV 600 mg BID for over 85 weeks.  Pretreatment 2 740 000 IU/mL., week 12 807 IU/mL, week 24 104 IU/mL, only less than 50 IU/mL at week 36 but otherwise always positive on treatment.  It appears that there were no dose reductions.  So would be considered a partial responder, but will still need 48 weeks of treatment.  Now that I have defined her response to treatment, have asked if she is eligible for a protocol.   403 .[id number]  D:  Thu Jul 12 15:49:26 2012 ; T:  Thu Jul 12 17:06:54 2012  Job #:  [id number]

## 2010-11-13 ENCOUNTER — Other Ambulatory Visit: Payer: Self-pay | Admitting: Neurosurgery

## 2010-11-13 ENCOUNTER — Ambulatory Visit
Admission: RE | Admit: 2010-11-13 | Discharge: 2010-11-13 | Disposition: A | Discharge status: Home / Self Care | Payer: Medicare Other | Source: Ambulatory Visit | Attending: Neurosurgery | Admitting: Neurosurgery

## 2010-11-13 DIAGNOSIS — M545 Low back pain: Secondary | ICD-10-CM

## 2010-11-17 ENCOUNTER — Other Ambulatory Visit: Payer: Self-pay | Admitting: Emergency Medicine

## 2010-11-17 DIAGNOSIS — Z1231 Encounter for screening mammogram for malignant neoplasm of breast: Secondary | ICD-10-CM

## 2010-11-17 LAB — PROTIME-INR
INR: 0.9
Prothrombin Time: 12.3

## 2010-11-17 LAB — CBC
MCV: 91.7
RBC: 4.59
WBC: 6.8

## 2010-12-22 ENCOUNTER — Ambulatory Visit
Admission: RE | Admit: 2010-12-22 | Discharge: 2010-12-22 | Disposition: A | Discharge status: Home / Self Care | Payer: Medicare Other | Source: Ambulatory Visit | Attending: Emergency Medicine | Admitting: Emergency Medicine

## 2010-12-22 DIAGNOSIS — Z1231 Encounter for screening mammogram for malignant neoplasm of breast: Secondary | ICD-10-CM

## 2011-01-31 ENCOUNTER — Ambulatory Visit (INDEPENDENT_AMBULATORY_CARE_PROVIDER_SITE_OTHER): Payer: Self-pay

## 2011-01-31 DIAGNOSIS — Z23 Encounter for immunization: Secondary | ICD-10-CM

## 2011-02-12 ENCOUNTER — Other Ambulatory Visit: Payer: Self-pay | Admitting: Neurosurgery

## 2011-02-12 ENCOUNTER — Ambulatory Visit
Admission: RE | Admit: 2011-02-12 | Discharge: 2011-02-12 | Disposition: A | Discharge status: Home / Self Care | Payer: Medicare Other | Source: Ambulatory Visit | Attending: Neurosurgery | Admitting: Neurosurgery

## 2011-02-12 DIAGNOSIS — M545 Low back pain: Secondary | ICD-10-CM

## 2011-02-17 ENCOUNTER — Ambulatory Visit
Admission: RE | Admit: 2011-02-17 | Discharge: 2011-02-17 | Disposition: A | Discharge status: Home / Self Care | Payer: Medicare Other | Source: Ambulatory Visit | Attending: Neurosurgery | Admitting: Neurosurgery

## 2011-02-17 ENCOUNTER — Other Ambulatory Visit: Payer: Self-pay | Admitting: Neurosurgery

## 2011-02-17 DIAGNOSIS — M79606 Pain in leg, unspecified: Secondary | ICD-10-CM

## 2011-02-17 DIAGNOSIS — M545 Low back pain: Secondary | ICD-10-CM

## 2011-02-24 ENCOUNTER — Other Ambulatory Visit: Payer: Self-pay | Admitting: Neurosurgery

## 2011-02-24 DIAGNOSIS — M545 Low back pain: Secondary | ICD-10-CM

## 2011-03-12 ENCOUNTER — Ambulatory Visit
Admission: RE | Admit: 2011-03-12 | Discharge: 2011-03-12 | Disposition: A | Discharge status: Home / Self Care | Payer: Medicare Other | Source: Ambulatory Visit | Attending: Neurosurgery | Admitting: Neurosurgery

## 2011-03-12 DIAGNOSIS — M545 Low back pain: Secondary | ICD-10-CM

## 2011-04-09 ENCOUNTER — Ambulatory Visit (INDEPENDENT_AMBULATORY_CARE_PROVIDER_SITE_OTHER): Payer: Self-pay | Admitting: Family Medicine

## 2011-04-09 VITALS — BP 130/74 | HR 78 | Temp 98.7°F | Resp 16 | Ht 65.0 in | Wt 163.0 lb

## 2011-04-09 DIAGNOSIS — Z720 Tobacco use: Secondary | ICD-10-CM

## 2011-04-09 DIAGNOSIS — J329 Chronic sinusitis, unspecified: Secondary | ICD-10-CM

## 2011-04-09 DIAGNOSIS — F17200 Nicotine dependence, unspecified, uncomplicated: Secondary | ICD-10-CM

## 2011-04-09 DIAGNOSIS — J309 Allergic rhinitis, unspecified: Secondary | ICD-10-CM

## 2011-04-09 DIAGNOSIS — J302 Other seasonal allergic rhinitis: Secondary | ICD-10-CM

## 2011-04-09 MED ORDER — FLUTICASONE PROPIONATE 50 MCG/ACT NA SUSP: 2.0000 | Freq: Every day | NASAL | Status: DC

## 2011-04-09 MED ORDER — PREDNISONE 20 MG PO TABS: 20.0000 mg | ORAL_TABLET | Freq: Every day | ORAL | Status: AC

## 2011-04-09 MED ORDER — AMOXICILLIN 875 MG PO TABS: 875.0000 mg | ORAL_TABLET | Freq: Two times a day (BID) | ORAL | Status: AC

## 2011-04-09 NOTE — Progress Notes (Signed)
  Subjective:    Patient ID: [name], female    DOB: December 24, 1950, 61 y.o.   MRN: [id number]  Cough This is a chronic problem. The current episode started more than 1 month ago. The problem has been unchanged. The problem occurs every few hours. The cough is productive of sputum. Pertinent negatives include no chest pain, chills, ear congestion, ear pain, fever, headaches, heartburn, hemoptysis, nasal congestion, postnasal drip, shortness of breath or wheezing. The symptoms are aggravated by nothing. Risk factors for lung disease include smoking/tobacco exposure.   Smoker 1ppd No recent illness No GERD symptoms   Review of Systems  Constitutional: Negative for fever and chills.  HENT: Negative for ear pain and postnasal drip.   Respiratory: Positive for cough. Negative for hemoptysis, shortness of breath and wheezing.   Cardiovascular: Negative for chest pain.  Gastrointestinal: Negative for heartburn.  Neurological: Negative for headaches.       Objective:   Physical Exam  Constitutional: She appears well-developed and well-nourished.  HENT:  Head: Normocephalic and atraumatic.  Nose: Mucosal edema and rhinorrhea (yellow) present.  Neck: Neck supple.  Cardiovascular: Normal rate, regular rhythm and normal heart sounds.   Pulmonary/Chest: Effort normal. Wheezes: coarse breath sounds.  Lymphadenopathy:    She has cervical adenopathy.  Neurological: She is alert.  Skin: Skin is warm.  Psychiatric: She has a normal mood and affect.          Assessment & Plan:   1. Sinusitis  amoxicillin (AMOXIL) 875 MG tablet, predniSONE (DELTASONE) 20 MG tablet  2. Seasonal allergies  fluticasone (FLONASE) 50 MCG/ACT nasal spray  3. Tobacco abuse     Anticipatory guidance. RTC if symptoms persist or worsen

## 2011-04-18 ENCOUNTER — Telehealth: Payer: Self-pay

## 2011-04-18 NOTE — Telephone Encounter (Signed)
Pt was seen 11 days ago for upper respitory  problem. She is not better.  She would like cough medicine called into [facility] on [address]

## 2011-04-18 NOTE — Telephone Encounter (Signed)
Pt was seen last week and given prednsone and antibiotic for cough.  She is still not well.  Would like to know if she can have cough syrup.  It has been 11 days.

## 2011-04-19 NOTE — Telephone Encounter (Signed)
Advise her to RTC

## 2011-04-20 ENCOUNTER — Other Ambulatory Visit: Payer: Self-pay | Admitting: Family Medicine

## 2011-04-20 NOTE — Telephone Encounter (Signed)
Patient notified, may try another OTC cough syrup.  INB, will RTC.

## 2011-04-27 ENCOUNTER — Encounter (HOSPITAL_COMMUNITY): Payer: Self-pay | Admitting: Pharmacy Technician

## 2011-04-28 NOTE — Pre-Procedure Instructions (Signed)
21 [name]  04/28/2011   Your procedure is scheduled on:  May 06, 2011 at 1044 am.  Report to [facility] at 0830 AM.  Call this number if you have problems the morning of surgery: [phone]   Remember:   Do not eat food:After Midnight.  May have clear liquids: up to 4 Hours before arrival until 0430 am.  Clear liquids include soda, tea, black coffee, apple or grape juice, broth.  Take these medicines the morning of surgery with A SIP OF WATER: Hydrocodone   do not wear jewelry, make-up or nail polish.  Do not wear lotions, powders, or perfumes. You may wear deodorant.  Do not shave 48 hours prior to surgery.  Do not bring valuables to the hospital.  Contacts, dentures or bridgework may not be worn into surgery.  Leave suitcase in the car. After surgery it may be brought to your room.  For patients admitted to the hospital, checkout time is 11:00 AM the day of discharge.   Patients discharged the day of surgery will not be allowed to drive home.  Name and phone number of your driver:   Special Instructions: CHG Shower Use Special Wash: 1/2 bottle night before surgery and 1/2 bottle morning of surgery.   Please read over the following fact sheets that you were given: Pain Booklet, Coughing and Deep Breathing, Blood Transfusion Information and Surgical Site Infection Prevention

## 2011-04-29 ENCOUNTER — Encounter (HOSPITAL_COMMUNITY)
Admission: RE | Admit: 2011-04-29 | Discharge: 2011-04-29 | Disposition: A | Discharge status: Home / Self Care | Payer: Medicare Other | Source: Ambulatory Visit | Attending: Neurosurgery | Admitting: Neurosurgery

## 2011-04-29 ENCOUNTER — Encounter (HOSPITAL_COMMUNITY): Payer: Self-pay

## 2011-04-29 LAB — COMPREHENSIVE METABOLIC PANEL
Albumin: 3.7 g/dL (ref 3.5–5.2)
Alkaline Phosphatase: 92 U/L (ref 39–117)
BUN: 11 mg/dL (ref 6–23)
Calcium: 9.4 mg/dL (ref 8.4–10.5)
GFR calc Af Amer: >90 mL/min (ref >90)
Potassium: 4 mEq/L (ref 3.5–5.1)
Total Protein: 7.9 g/dL (ref 6.0–8.3)

## 2011-04-29 LAB — CBC
HCT: 35.4 % — ABNORMAL LOW (ref 36.0–46.0)
MCV: 91.9 fL (ref 78.0–100.0)
Platelets: 136 10*3/uL — ABNORMAL LOW (ref 150–400)
RBC: 3.85 MIL/uL — ABNORMAL LOW (ref 3.87–5.11)
RDW: 13.7 % (ref 11.5–15.5)
WBC: 3.6 10*3/uL — ABNORMAL LOW (ref 4.0–10.5)

## 2011-04-29 LAB — TYPE AND SCREEN: Antibody Screen: NEGATIVE

## 2011-04-29 LAB — SURGICAL PCR SCREEN: Staphylococcus aureus: NEGATIVE

## 2011-05-05 MED ORDER — CEFAZOLIN SODIUM 1-5 GM-% IV SOLN
1.0000 g | INTRAVENOUS | Status: AC
  Administered 2011-05-06: 1 g via INTRAVENOUS
  Filled 2011-05-05: qty 50

## 2011-05-06 ENCOUNTER — Encounter (HOSPITAL_COMMUNITY): Payer: Self-pay | Admitting: *Deleted

## 2011-05-06 ENCOUNTER — Ambulatory Visit (HOSPITAL_COMMUNITY): Payer: Medicare Other | Admitting: Anesthesiology

## 2011-05-06 ENCOUNTER — Inpatient Hospital Stay (HOSPITAL_COMMUNITY)
Admission: RE | Admit: 2011-05-06 | Discharge: 2011-05-10 | DRG: 458 | Disposition: A | Discharge status: Home Health | Payer: Medicare Other | Source: Ambulatory Visit | Attending: Neurosurgery | Admitting: Neurosurgery

## 2011-05-06 ENCOUNTER — Ambulatory Visit (HOSPITAL_COMMUNITY): Payer: Medicare Other

## 2011-05-06 ENCOUNTER — Encounter (HOSPITAL_COMMUNITY)
Admission: RE | Disposition: A | Discharge status: Home Health | Payer: Self-pay | Source: Ambulatory Visit | Attending: Neurosurgery

## 2011-05-06 ENCOUNTER — Encounter (HOSPITAL_COMMUNITY): Payer: Self-pay | Admitting: Anesthesiology

## 2011-05-06 DIAGNOSIS — Z981 Arthrodesis status: Secondary | ICD-10-CM

## 2011-05-06 DIAGNOSIS — Z90710 Acquired absence of both cervix and uterus: Secondary | ICD-10-CM

## 2011-05-06 DIAGNOSIS — M48061 Spinal stenosis, lumbar region without neurogenic claudication: Secondary | ICD-10-CM

## 2011-05-06 DIAGNOSIS — Z01812 Encounter for preprocedural laboratory examination: Secondary | ICD-10-CM

## 2011-05-06 DIAGNOSIS — M4000 Postural kyphosis, site unspecified: Principal | ICD-10-CM | POA: Diagnosis present

## 2011-05-06 DIAGNOSIS — F17200 Nicotine dependence, unspecified, uncomplicated: Secondary | ICD-10-CM | POA: Diagnosis present

## 2011-05-06 DIAGNOSIS — Z8619 Personal history of other infectious and parasitic diseases: Secondary | ICD-10-CM

## 2011-05-06 DIAGNOSIS — Z01818 Encounter for other preprocedural examination: Secondary | ICD-10-CM

## 2011-05-06 SURGERY — POSTERIOR LUMBAR FUSION 3 LEVEL
Anesthesia: General | Site: Back | Wound class: Clean

## 2011-05-06 MED ORDER — ROCURONIUM BROMIDE 100 MG/10ML IV SOLN
INTRAVENOUS | Status: DC | PRN
  Administered 2011-05-06: 50 mg via INTRAVENOUS

## 2011-05-06 MED ORDER — MENTHOL 3 MG MT LOZG
1.0000 | LOZENGE | OROMUCOSAL | Status: DC | PRN
  Administered 2011-05-06: 3 mg via ORAL
  Filled 2011-05-06 (×6): qty 9

## 2011-05-06 MED ORDER — BACITRACIN 50000 UNITS IM SOLR
INTRAMUSCULAR | Status: AC
  Filled 2011-05-06: qty 1

## 2011-05-06 MED ORDER — HYDROCODONE-ACETAMINOPHEN 10-325 MG PO TABS
1.0000 | ORAL_TABLET | Freq: Four times a day (QID) | ORAL | Status: DC | PRN
  Administered 2011-05-08 – 2011-05-09 (×3): 1 via ORAL
  Administered 2011-05-09 (×2): 2 via ORAL
  Administered 2011-05-10 (×2): 1 via ORAL
  Filled 2011-05-06 (×2): qty 1
  Filled 2011-05-06: qty 2
  Filled 2011-05-06 (×3): qty 1
  Filled 2011-05-06 (×2): qty 2
  Filled 2011-05-06: qty 1

## 2011-05-06 MED ORDER — OXYCODONE-ACETAMINOPHEN 5-325 MG PO TABS
1.0000 | ORAL_TABLET | ORAL | Status: DC | PRN
  Administered 2011-05-07: 2 via ORAL
  Filled 2011-05-06: qty 2

## 2011-05-06 MED ORDER — ALUM & MAG HYDROXIDE-SIMETH 200-200-20 MG/5ML PO SUSP: 30.0000 mL | Freq: Four times a day (QID) | ORAL | Status: DC | PRN

## 2011-05-06 MED ORDER — HYDROMORPHONE HCL PF 1 MG/ML IJ SOLN
0.5000 mg | INTRAMUSCULAR | Status: DC | PRN
  Administered 2011-05-06 (×2): 1 mg via INTRAVENOUS
  Administered 2011-05-06: 0.5 mg via INTRAVENOUS
  Administered 2011-05-06 – 2011-05-08 (×11): 1 mg via INTRAVENOUS
  Filled 2011-05-06 (×13): qty 1

## 2011-05-06 MED ORDER — LIDOCAINE HCL (CARDIAC) 20 MG/ML IV SOLN
INTRAVENOUS | Status: DC | PRN
  Administered 2011-05-06: 50 mg via INTRAVENOUS

## 2011-05-06 MED ORDER — HYDROMORPHONE HCL PF 1 MG/ML IJ SOLN
INTRAMUSCULAR | Status: AC
  Filled 2011-05-06: qty 1

## 2011-05-06 MED ORDER — ACETAMINOPHEN 325 MG PO TABS
650.0000 mg | ORAL_TABLET | ORAL | Status: DC | PRN
  Administered 2011-05-07 – 2011-05-08 (×4): 650 mg via ORAL
  Filled 2011-05-06 (×4): qty 2

## 2011-05-06 MED ORDER — LACTATED RINGERS IV SOLN
INTRAVENOUS | Status: DC | PRN
  Administered 2011-05-06 (×3): via INTRAVENOUS

## 2011-05-06 MED ORDER — PHENOL 1.4 % MT LIQD: 1.0000 | OROMUCOSAL | Status: DC | PRN

## 2011-05-06 MED ORDER — HYDROMORPHONE HCL PF 1 MG/ML IJ SOLN
0.2500 mg | INTRAMUSCULAR | Status: DC | PRN
  Administered 2011-05-06 (×4): 0.5 mg via INTRAVENOUS

## 2011-05-06 MED ORDER — MIDAZOLAM HCL 5 MG/5ML IJ SOLN
INTRAMUSCULAR | Status: DC | PRN
  Administered 2011-05-06: 2 mg via INTRAVENOUS

## 2011-05-06 MED ORDER — ACETAMINOPHEN 650 MG RE SUPP: 650.0000 mg | RECTAL | Status: DC | PRN

## 2011-05-06 MED ORDER — TRAMADOL HCL 50 MG PO TABS
50.0000 mg | ORAL_TABLET | Freq: Four times a day (QID) | ORAL | Status: DC
  Administered 2011-05-06 – 2011-05-10 (×15): 50 mg via ORAL
  Filled 2011-05-06 (×21): qty 1

## 2011-05-06 MED ORDER — VECURONIUM BROMIDE 10 MG IV SOLR
INTRAVENOUS | Status: DC | PRN
  Administered 2011-05-06: 2 mg via INTRAVENOUS

## 2011-05-06 MED ORDER — SUFENTANIL CITRATE 50 MCG/ML IV SOLN
INTRAVENOUS | Status: DC | PRN
  Administered 2011-05-06: 5 ug via INTRAVENOUS
  Administered 2011-05-06: 10 ug via INTRAVENOUS
  Administered 2011-05-06: 15 ug via INTRAVENOUS
  Administered 2011-05-06: 5 ug via INTRAVENOUS
  Administered 2011-05-06: 10 ug via INTRAVENOUS
  Administered 2011-05-06: 5 ug via INTRAVENOUS

## 2011-05-06 MED ORDER — NEOSTIGMINE METHYLSULFATE 1 MG/ML IJ SOLN
INTRAMUSCULAR | Status: DC | PRN
  Administered 2011-05-06: 5 mg via INTRAVENOUS

## 2011-05-06 MED ORDER — ONDANSETRON HCL 4 MG/2ML IJ SOLN: 4.0000 mg | INTRAMUSCULAR | Status: DC | PRN

## 2011-05-06 MED ORDER — CEFAZOLIN SODIUM 1-5 GM-% IV SOLN
1.0000 g | Freq: Three times a day (TID) | INTRAVENOUS | Status: AC
  Administered 2011-05-06 – 2011-05-07 (×2): 1 g via INTRAVENOUS
  Filled 2011-05-06 (×2): qty 50

## 2011-05-06 MED ORDER — SODIUM CHLORIDE 0.9 % IV SOLN
INTRAVENOUS | Status: AC
  Filled 2011-05-06: qty 500

## 2011-05-06 MED ORDER — LIDOCAINE-EPINEPHRINE 1 %-1:100000 IJ SOLN
INTRAMUSCULAR | Status: DC | PRN
  Administered 2011-05-06: 20 mL

## 2011-05-06 MED ORDER — MORPHINE SULFATE 2 MG/ML IJ SOLN: 0.0500 mg/kg | INTRAMUSCULAR | Status: DC | PRN

## 2011-05-06 MED ORDER — FLUTICASONE PROPIONATE 50 MCG/ACT NA SUSP
2.0000 | Freq: Every day | NASAL | Status: DC
  Administered 2011-05-06 – 2011-05-10 (×5): 2 via NASAL
  Filled 2011-05-06: qty 16

## 2011-05-06 MED ORDER — GLYCOPYRROLATE 0.2 MG/ML IJ SOLN
INTRAMUSCULAR | Status: DC | PRN
  Administered 2011-05-06: .8 mg via INTRAVENOUS

## 2011-05-06 MED ORDER — EPHEDRINE SULFATE 50 MG/ML IJ SOLN
INTRAMUSCULAR | Status: DC | PRN
  Administered 2011-05-06 (×3): 5 mg via INTRAVENOUS

## 2011-05-06 MED ORDER — PHENYLEPHRINE HCL 10 MG/ML IJ SOLN
INTRAMUSCULAR | Status: DC | PRN
  Administered 2011-05-06 (×5): 40 ug via INTRAVENOUS

## 2011-05-06 MED ORDER — LACTATED RINGERS IV SOLN: INTRAVENOUS | Status: DC

## 2011-05-06 MED ORDER — BUPIVACAINE HCL (PF) 0.25 % IJ SOLN
INTRAMUSCULAR | Status: DC | PRN
  Administered 2011-05-06: 30 mL

## 2011-05-06 MED ORDER — CYCLOBENZAPRINE HCL 10 MG PO TABS
10.0000 mg | ORAL_TABLET | Freq: Three times a day (TID) | ORAL | Status: DC | PRN
  Administered 2011-05-06 – 2011-05-10 (×4): 10 mg via ORAL
  Filled 2011-05-06 (×4): qty 1

## 2011-05-06 MED ORDER — ONDANSETRON HCL 4 MG/2ML IJ SOLN
INTRAMUSCULAR | Status: DC | PRN
  Administered 2011-05-06: 4 mg via INTRAVENOUS

## 2011-05-06 MED ORDER — THROMBIN 20000 UNITS EX KIT
PACK | CUTANEOUS | Status: DC | PRN
  Administered 2011-05-06: 13:00:00 via TOPICAL

## 2011-05-06 MED ORDER — SODIUM CHLORIDE 0.9 % IJ SOLN
3.0000 mL | Freq: Two times a day (BID) | INTRAMUSCULAR | Status: DC
  Administered 2011-05-08 – 2011-05-10 (×4): 3 mL via INTRAVENOUS

## 2011-05-06 MED ORDER — PROPOFOL 10 MG/ML IV EMUL
INTRAVENOUS | Status: DC | PRN
  Administered 2011-05-06: 200 mg via INTRAVENOUS

## 2011-05-06 MED ORDER — GABAPENTIN 300 MG PO CAPS
300.0000 mg | ORAL_CAPSULE | Freq: Three times a day (TID) | ORAL | Status: DC
  Administered 2011-05-06 – 2011-05-10 (×11): 300 mg via ORAL
  Filled 2011-05-06 (×13): qty 1

## 2011-05-06 MED ORDER — SODIUM CHLORIDE 0.9 % IR SOLN
Status: DC | PRN
  Administered 2011-05-06: 13:00:00

## 2011-05-06 SURGICAL SUPPLY — 73 items
ADH SKN CLS APL DERMABOND .7 (GAUZE/BANDAGES/DRESSINGS) ×1
APL SKNCLS STERI-STRIP NONHPOA (GAUZE/BANDAGES/DRESSINGS) ×1
BAG DECANTER FOR FLEXI CONT (MISCELLANEOUS) ×2 IMPLANT
BANDAGE GAUZE ELAST BULKY 4 IN (GAUZE/BANDAGES/DRESSINGS) ×1 IMPLANT
BENZOIN TINCTURE PRP APPL 2/3 (GAUZE/BANDAGES/DRESSINGS) ×2 IMPLANT
BLADE SURG 11 STRL SS (BLADE) ×2 IMPLANT
BLADE SURG ROTATE 9660 (MISCELLANEOUS) IMPLANT
BRUSH SCRUB EZ PLAIN DRY (MISCELLANEOUS) ×2 IMPLANT
BUR MATCHSTICK NEURO 3.0 LAGG (BURR) ×2 IMPLANT
BUR PRECISION FLUTE 6.0 (BURR) ×2 IMPLANT
CANISTER SUCTION 2500CC (MISCELLANEOUS) ×2 IMPLANT
CLOTH BEACON ORANGE TIMEOUT ST (SAFETY) ×2 IMPLANT
CONT SPEC 4OZ CLIKSEAL STRL BL (MISCELLANEOUS) ×6 IMPLANT
COVER BACK TABLE 24X17X13 BIG (DRAPES) IMPLANT
COVER TABLE BACK 60X90 (DRAPES) ×2 IMPLANT
CROSSLINK DANEK (Cage) ×2 IMPLANT
DECANTER SPIKE VIAL GLASS SM (MISCELLANEOUS) ×2 IMPLANT
DERMABOND ADVANCED (GAUZE/BANDAGES/DRESSINGS) ×1
DERMABOND ADVANCED .7 DNX12 (GAUZE/BANDAGES/DRESSINGS) ×1 IMPLANT
DRAPE C-ARM 42X72 X-RAY (DRAPES) ×4 IMPLANT
DRAPE INCISE IOBAN 66X45 STRL (DRAPES) ×1 IMPLANT
DRAPE LAPAROTOMY 100X72X124 (DRAPES) ×2 IMPLANT
DRAPE POUCH INSTRU U-SHP 10X18 (DRAPES) ×2 IMPLANT
DRAPE PROXIMA HALF (DRAPES) IMPLANT
DRAPE SURG 17X23 STRL (DRAPES) ×2 IMPLANT
DRSG OPSITE 4X5.5 SM (GAUZE/BANDAGES/DRESSINGS) ×3 IMPLANT
ELECT REM PT RETURN 9FT ADLT (ELECTROSURGICAL) ×2
ELECTRODE REM PT RTRN 9FT ADLT (ELECTROSURGICAL) ×1 IMPLANT
EVACUATOR 1/8 PVC DRAIN (DRAIN) IMPLANT
EVACUATOR 3/16  PVC DRAIN (DRAIN) ×2
EVACUATOR 3/16 PVC DRAIN (DRAIN) ×1 IMPLANT
GAUZE SPONGE 4X4 16PLY XRAY LF (GAUZE/BANDAGES/DRESSINGS) ×2 IMPLANT
GLOVE BIO SURGEON STRL SZ8 (GLOVE) ×4 IMPLANT
GLOVE BIOGEL PI IND STRL 7.5 (GLOVE) IMPLANT
GLOVE BIOGEL PI IND STRL 8 (GLOVE) IMPLANT
GLOVE BIOGEL PI INDICATOR 7.5 (GLOVE) ×1
GLOVE BIOGEL PI INDICATOR 8 (GLOVE) ×1
GLOVE ECLIPSE 7.5 STRL STRAW (GLOVE) ×1 IMPLANT
GLOVE EXAM NITRILE LRG STRL (GLOVE) IMPLANT
GLOVE EXAM NITRILE MD LF STRL (GLOVE) ×1 IMPLANT
GLOVE EXAM NITRILE XL STR (GLOVE) IMPLANT
GLOVE EXAM NITRILE XS STR PU (GLOVE) IMPLANT
GLOVE INDICATOR 8.5 STRL (GLOVE) ×4 IMPLANT
GOWN BRE IMP SLV AUR LG STRL (GOWN DISPOSABLE) IMPLANT
GOWN BRE IMP SLV AUR XL STRL (GOWN DISPOSABLE) ×5 IMPLANT
GOWN STRL REIN 2XL LVL4 (GOWN DISPOSABLE) ×2 IMPLANT
KIT BASIN OR (CUSTOM PROCEDURE TRAY) ×2 IMPLANT
KIT INFUSE MEDIUM (Orthopedic Implant) ×1 IMPLANT
KIT ROOM TURNOVER OR (KITS) ×2 IMPLANT
MARKER SKIN DUAL TIP RULER LAB (MISCELLANEOUS) ×1 IMPLANT
MILL MEDIUM DISP (BLADE) ×1 IMPLANT
NDL HYPO 25X1 1.5 SAFETY (NEEDLE) ×1 IMPLANT
NEEDLE HYPO 25X1 1.5 SAFETY (NEEDLE) ×2 IMPLANT
NS IRRIG 1000ML POUR BTL (IV SOLUTION) ×2 IMPLANT
PACK LAMINECTOMY NEURO (CUSTOM PROCEDURE TRAY) ×2 IMPLANT
PAD ARMBOARD 7.5X6 YLW CONV (MISCELLANEOUS) ×6 IMPLANT
PLATE BN 1.55-1.75XLO BAR (Cage) IMPLANT
ROD L635 HEX END LINED (Rod) ×1 IMPLANT
SCREW PEDICLE VA L635 6.5X45M (Screw) ×4 IMPLANT
SCREW SET NON BREAK OFF (Screw) ×8 IMPLANT
SPONGE GAUZE 4X4 12PLY (GAUZE/BANDAGES/DRESSINGS) ×2 IMPLANT
SPONGE LAP 4X18 X RAY DECT (DISPOSABLE) IMPLANT
SPONGE SURGIFOAM ABS GEL 100 (HEMOSTASIS) ×2 IMPLANT
STRIP CLOSURE SKIN 1/2X4 (GAUZE/BANDAGES/DRESSINGS) ×3 IMPLANT
SUT VIC AB 0 CT1 18XCR BRD8 (SUTURE) ×2 IMPLANT
SUT VIC AB 0 CT1 8-18 (SUTURE) ×6
SUT VIC AB 2-0 CT1 18 (SUTURE) ×5 IMPLANT
SUT VICRYL 4-0 PS2 18IN ABS (SUTURE) ×3 IMPLANT
SYR 20ML ECCENTRIC (SYRINGE) ×2 IMPLANT
TOWEL OR 17X24 6PK STRL BLUE (TOWEL DISPOSABLE) ×2 IMPLANT
TOWEL OR 17X26 10 PK STRL BLUE (TOWEL DISPOSABLE) ×2 IMPLANT
TRAY FOLEY CATH 14FRSI W/METER (CATHETERS) ×2 IMPLANT
WATER STERILE IRR 1000ML POUR (IV SOLUTION) ×2 IMPLANT

## 2011-05-06 NOTE — Anesthesia Postprocedure Evaluation (Signed)
  Anesthesia Post-op Note  Patient: [name]  Procedure(s) Performed: Procedure(s) (LRB): POSTERIOR LUMBAR FUSION 3 LEVEL (N/A)  Patient Location: PACU  Anesthesia Type: General  Level of Consciousness: awake  Airway and Oxygen Therapy: Patient Spontanous Breathing  Post-op Pain: mild  Post-op Assessment: Post-op Vital signs reviewed  Post-op Vital Signs: stable  Complications: No apparent anesthesia complications

## 2011-05-06 NOTE — Anesthesia Preprocedure Evaluation (Addendum)
Anesthesia Evaluation  Patient identified by MRN, date of birth, ID band Patient awake    Reviewed: Allergy & Precautions, H&P , NPO status , Patient's Chart, lab work & pertinent test results  Airway Mallampati: II      Dental   Pulmonary neg pulmonary ROS, Current Smoker,  breath sounds clear to auscultation        Cardiovascular negative cardio ROS  Rhythm:Regular Rate:Normal     Neuro/Psych negative neurological ROS  negative psych ROS   GI/Hepatic negative GI ROS, (+) Hepatitis -, C  Endo/Other  negative endocrine ROS  Renal/GU negative Renal ROS     Musculoskeletal negative musculoskeletal ROS (+)   Abdominal   Peds  Hematology negative hematology ROS (+)   Anesthesia Other Findings   Reproductive/Obstetrics                         Anesthesia Physical Anesthesia Plan  ASA: III  Anesthesia Plan: General   Post-op Pain Management:    Induction: Intravenous  Airway Management Planned: Oral ETT  Additional Equipment:   Intra-op Plan:   Post-operative Plan: Extubation in OR  Informed Consent:   Plan Discussed with: CRNA  Anesthesia Plan Comments:         Anesthesia Quick Evaluation

## 2011-05-06 NOTE — Transfer of Care (Signed)
Immediate Anesthesia Transfer of Care Note  Patient: [name]  Procedure(s) Performed: Procedure(s) (LRB): POSTERIOR LUMBAR FUSION 3 LEVEL (N/A)  Patient Location: PACU  Anesthesia Type: General  Level of Consciousness: awake, alert  and patient cooperative  Airway & Oxygen Therapy: Patient Spontanous Breathing and Patient connected to face mask oxygen  Post-op Assessment: Report given to PACU RN  Post vital signs: Reviewed and stable  Complications: No apparent anesthesia complications

## 2011-05-06 NOTE — Anesthesia Procedure Notes (Signed)
Procedure Name: Intubation Date/Time: 05/06/2011 11:48 AM Performed by: Lovie Chol Pre-anesthesia Checklist: Patient identified, Emergency Drugs available, Suction available, Patient being monitored and Timeout performed Patient Re-evaluated:Patient Re-evaluated prior to inductionOxygen Delivery Method: Circle system utilized Preoxygenation: Pre-oxygenation with 100% oxygen Intubation Type: IV induction Ventilation: Mask ventilation without difficulty Laryngoscope Size: Miller and 2 Grade View: Grade I Tube type: Oral Tube size: 7.5 mm Number of attempts: 1 Airway Equipment and Method: Stylet Placement Confirmation: ETT inserted through vocal cords under direct vision,  positive ETCO2,  CO2 detector and breath sounds checked- equal and bilateral Secured at: 23 cm Tube secured with: Tape Dental Injury: Teeth and Oropharynx as per pre-operative assessment

## 2011-05-06 NOTE — H&P (Signed)
[name] is an 61 y.o. female.   Chief Complaint: Back and left greater right leg pain HPI: Patient is a very pleasant 61 year old female presents with long-standing back and bilateral leg pain syndrome multiple back operations most recent which was a L2-3 fusion. Agent initially did well however has had progressive worsening pain probably in her back radiating to her left buttock and leg consistent with an L2 or L3 nerve root pattern followup imaging showed a progressive pseudoarthrosis at L2-3 possible pseudarthrosis at L3-4 and worsening kyphosis over the L2-3 and L3-4 on the spaces. Patient refractory to all forms of conservative treatment anti-inflammatories and narcotic pain medication epidural steroid injections physical therapy. Due to patient's failed conservative treatment progression of clinical syndrome imaging findings I recommended a redo L1-4 fusion with iliac crest bone graft absence reviewed the risks and benefits of the operation as well as expectations of outcome alternatives of surgery perioperative course she understands and agrees to proceed forward to  Past Medical History  Diagnosis Date  . Hepatitis     "c"    Past Surgical History  Procedure Date  . Back surgery     x4  . Abdominal hysterectomy   . Cholecystectomy     History reviewed. No pertinent family history. Social History:  reports that she has been smoking Cigarettes.  She has a 25 pack-year smoking history. She does not have any smokeless tobacco history on file. She reports that she does not drink alcohol or use illicit drugs.  Allergies: No Known Allergies  Medications Prior to Admission  Medication Dose Route Frequency Provider Last Rate Last Dose  . bacitracin 69629 UNITS injection           . ceFAZolin (ANCEF) IVPB 1 g/50 mL premix  1 g Intravenous 30 min Pre-Op Kimberly Dollar, MD      . HYDROmorphone (DILAUDID) injection 0.25-0.5 mg  0.25-0.5 mg Intravenous Q5 min PRN Judie Petit, MD        . morphine 2 MG/ML injection 0.05 mg/kg  0.05 mg/kg Intravenous Q10 min PRN Judie Petit, MD      . sodium chloride 0.9 % infusion            Medications Prior to Admission  Medication Sig Dispense Refill  . gabapentin (NEURONTIN) 300 MG capsule Take 300 mg by mouth as needed.      . traMADol (ULTRAM) 50 MG tablet Take 50 mg by mouth every 6 (six) hours as needed.        No results found for this or any previous visit (from the past 48 hour(s)). No results found.  Review of Systems  Constitutional: Negative.   HENT: Negative.   Eyes: Negative.   Respiratory: Negative.   Cardiovascular: Negative.   Gastrointestinal: Negative.   Genitourinary: Negative.   Musculoskeletal: Positive for myalgias, back pain and joint pain.  Skin: Negative.   Neurological: Positive for tingling and sensory change.  Endo/Heme/Allergies: Negative.     Blood pressure 125/80, pulse 78, temperature 98.2 F (36.8 C), temperature source Oral, resp. rate 20, SpO2 98.00%. Physical Exam  Constitutional: She is oriented to person, place, and time. She appears well-developed and well-nourished.  HENT:  Head: Normocephalic.  Neck: Normal range of motion.  Respiratory: Effort normal and breath sounds normal.  GI: Soft. Bowel sounds are normal.  Neurological: She is alert and oriented to person, place, and time. She has normal strength. She displays a negative Romberg sign.  Reflex Scores:  Patellar reflexes are 0 on the right side and 0 on the left side.      Achilles reflexes are 0 on the right side and 0 on the left side.      Strength is 5 out of 5 in her iliopsoas, quads, and she's, gastrocs, anterior tibialis, and EHL     Assessment/Plan 60 to feel but presents for L1-L4 fusion for pseudoarthrosis at L2-3 and L3-4 with iliac crest bone graft. As you know there is no separation she understands and agrees to proceed forward.  Amyiah Gaba P 05/06/2011, 11:20 AM

## 2011-05-06 NOTE — Op Note (Signed)
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  EBL: Less than 300  History of present illness: Patient is a 20 was a progress worsening back pain imaging that showed progressive kyphosis at L2-3 and L3-4 pseudoarthrosis at L3-4 possibly L2-3. The patient progressed clinical syndrome failed conservative treatment she was recommended a reexploration of lumbar wound redo posterior lateral fusion using iliac crest bone graft harvest and open reduction spinal deformity L1 and L4. Whether is evidence of a person with her she understood and agreed to proceed forward as well as  Course expectations of outcome alternatives of surgery.  Operative procedure: Patient brought to the or was induced under general anesthesia and positioned prone the Wilson frame her back was prepped and draped in routine sterile fashion. Her old incision was opened up and extended slightly cephalad 6 the scar tissues dissected free exposing the hardware at L2-3 PCL 1 and L4 were exposed and the old fusion posterior lateral mass which was torn I would've active use it was poorly are nice enough used to the TPS L3 and L4 were exposed and removed some further education MTPs L2-3 and 4 were dissected free and exposed. After all the hardware but expose enough to remove the rods removed edges intake and harvesting of left iliac crest bone graft through a separate skin incision is made over the iliac posterior superior iliac spine 4 fingerbreadths off midline dissections care on posterior laterally at spine and using  gouges and chisels cancellous bone was harvested and after an adequate amount of harvested bone was achieved dose pack Gelfoam a drain was placed was closed with Vicryl and a running 4 subcuticular and skin. After only a custom graft harvest was taken back to the lumbar wound using fluoroscopy simple the way both AP and lateral trajectory, holes and pedicle holes were reidentified L1 and L4 carried the awl probed 55 Probed again 6 5 x 45 screws placed at L1 and L4 both screws excellent purchase then 2 rods were cut and top tightness tightened down at L4 the L3 screw compressed against L4 the L2 compressing is L3 and L1 compressed against L2 this achieved significant reduction of her kyphotic deformity. Intense taken the posterior lateral work extending from the TPS at L1-2-3 and 4 these were aggressively drilled and decorticated after copious irrigation the cancellus bone from the iliac crest was impacted from the posterior laterally from the TPS from L1 down to L4 as well as BMP soaked sponges then a cross-link was applied posterior fluoroscopy confirmed good position of screws rods and implants large Hemovac drain was placed the wounds closed in layers with after Vicryl the skin was) 4 subcuticular benzoin Steri-Strips were applied patient recovered in stable condition at the M. and counts sponge counts were correct.

## 2011-05-07 MED ORDER — CEFAZOLIN SODIUM 1-5 GM-% IV SOLN
1.0000 g | Freq: Three times a day (TID) | INTRAVENOUS | Status: AC
  Administered 2011-05-07 – 2011-05-08 (×2): 1 g via INTRAVENOUS
  Filled 2011-05-07 (×2): qty 50

## 2011-05-07 NOTE — Progress Notes (Signed)
Nursing- pt foley removed at 0600. Pt was able to ambulate to the door and back. Pt tolerated well with moderate pain. Will cont' to monitor.

## 2011-05-07 NOTE — Progress Notes (Signed)
Physical Therapy Evaluation Patient Details Name: [name] MRN: [id number] DOB: 10-28-1950 Today's Date: 05/07/2011  Problem List: There is no problem list on file for this patient.   Past Medical History:  Past Medical History  Diagnosis Date  . Hepatitis     "c"   Past Surgical History:  Past Surgical History  Procedure Date  . Back surgery     x4  . Abdominal hysterectomy   . Cholecystectomy     PT Assessment/Plan/Recommendation PT Assessment Clinical Impression Statement: Pt presents with a medical diagnosis of L1-4 fusion along with the following impairments/deficits and therapy diagnosis listed below. Pt will benefit from skilled PT in the acute care setting in order to maximize functional mobility for a safe d/c home PT Recommendation/Assessment: Patient will need skilled PT in the acute care venue PT Problem List: Decreased activity tolerance;Decreased mobility;Decreased knowledge of use of DME;Decreased safety awareness;Decreased knowledge of precautions;Pain PT Therapy Diagnosis : Difficulty walking;Acute pain PT Plan PT Frequency: Min 5X/week PT Treatment/Interventions: DME instruction;Gait training;Stair training;Functional mobility training;Therapeutic activities;Therapeutic exercise;Patient/family education PT Recommendation Follow Up Recommendations: Home health PT;Supervision - Intermittent Equipment Recommended: None recommended by OT;None recommended by PT PT Goals  Acute Rehab PT Goals PT Goal Formulation: With patient/family Time For Goal Achievement: 7 days Pt will go Supine/Side to Sit: with modified independence PT Goal: Supine/Side to Sit - Progress: Goal set today Pt will go Sit to Supine/Side: with modified independence PT Goal: Sit to Supine/Side - Progress: Goal set today Pt will go Sit to Stand: with modified independence PT Goal: Sit to Stand - Progress: Goal set today Pt will go Stand to Sit: with modified independence PT Goal: Stand  to Sit - Progress: Goal set today Pt will Transfer Bed to Chair/Chair to Bed: with supervision PT Transfer Goal: Bed to Chair/Chair to Bed - Progress: Goal set today Pt will Ambulate: >150 feet;with supervision;with least restrictive assistive device PT Goal: Ambulate - Progress: Goal set today Pt will Go Up / Down Stairs: 1-2 stairs;with min assist;with least restrictive assistive device PT Goal: Up/Down Stairs - Progress: Goal set today  PT Evaluation Precautions/Restrictions  Precautions Precautions: Back Precaution Booklet Issued: Yes (comment) Precaution Comments: pt educated on 3/3 back precautions Required Braces or Orthoses: Yes Spinal Brace: Lumbar corset;Applied in sitting position Prior Functioning  Home Living Lives With: Spouse Receives Help From: Family Type of Home: House Home Layout: One level Home Access: Stairs to enter Entrance Stairs-Rails: None Entrance Stairs-Number of Steps: 1 Bathroom Shower/Tub: Psychologist, counselling;Door (step into shower) Bathroom Toilet: Standard Bathroom Accessibility: Yes How Accessible: Accessible via walker Home Adaptive Equipment: Bedside commode/3-in-1;Walker - rolling Prior Function Level of Independence: Independent with basic ADLs;Independent with gait Cognition Cognition Arousal/Alertness: Lethargic Overall Cognitive Status: Difficult to assess Difficult to assess due to: level of arousal Orientation Level: Oriented X4 Sensation/Coordination Sensation Light Touch: Appears Intact Coordination Gross Motor Movements are Fluid and Coordinated: Yes Extremity Assessment RUE Assessment RUE Assessment: Within Functional Limits LUE Assessment LUE Assessment: Within Functional Limits RLE Assessment RLE Assessment: Within Functional Limits LLE Assessment LLE Assessment: Within Functional Limits Mobility (including Balance) Bed Mobility Bed Mobility: Yes Sit to Sidelying Left: 2: Max assist;With rail;HOB flat Sit to Sidelying  Left Details (indicate cue type and reason): Assist to support trunk and legs into bed while maintaining back precautions. Transfers Transfers: Yes Sit to Stand: 4: Min assist;From chair/3-in-1;With armrests;With upper extremity assist Sit to Stand Details (indicate cue type and reason): cueing for safe hand placement and  sequencing. Stand to Sit: 4: Min assist;To bed;With upper extremity assist Stand to Sit Details: cueing for technique and hand placement Ambulation/Gait Ambulation/Gait: Yes Ambulation/Gait Assistance: 4: Min assist Ambulation/Gait Assistance Details (indicate cue type and reason): Min assist for stability although max cues to maintain arousal. Pt extremely lethargic and required increased assistance for safety during ambulation. VC for safety with distance to RW, sequencing as well as postural cues. Ambulation Distance (Feet): 30 Feet Assistive device: Rolling walker Gait Pattern: Step-to pattern;Trunk flexed;Decreased trunk rotation;Decreased hip/knee flexion - right;Decreased hip/knee flexion - left;Decreased stride length Gait velocity: decreased cadence Stairs: No    Exercise    End of Session PT - End of Session Equipment Utilized During Treatment: Gait belt;Back brace Activity Tolerance: Patient limited by fatigue Patient left: in bed;with call bell in reach;with family/visitor present Nurse Communication: Mobility status for transfers;Mobility status for ambulation General Behavior During Session: Lethargic Cognition: WFL for tasks performed (max verbal cues for safety due to decreased arousal )  Milana Kidney 05/07/2011, 4:58 PM  05/07/2011 Milana Kidney DPT PAGER: [phone] OFFICE: [phone]

## 2011-05-07 NOTE — Progress Notes (Signed)
Subjective: Patient reports she's feeling better she has no leg pain she's oriented and gone to the bathroom.  Objective: Vital signs in last 24 hours: Temp:  [98 F (36.7 C)-100.8 F (38.2 C)] 100.6 F (38.1 C) (03/28 0700) Pulse Rate:  [66-85] 85  (03/28 0525) Resp:  [10-21] 18  (03/28 0525) BP: (125-149)/(62-80) 144/71 mmHg (03/28 0525) SpO2:  [96 %-100 %] 100 % (03/28 0525) Weight:  [74.8 kg (164 lb 14.5 oz)] 74.8 kg (164 lb 14.5 oz) (03/28 0243)  Intake/Output from previous day: 03/27 0701 - 03/28 0700 In: 2500 [I.V.:2500] Out: 1585 [Urine:790; Drains:420; Blood:375] Intake/Output this shift:    Strength is 5 out of 5 wound clean and dry  Lab Results: No results found for this basename: WBC:2,HGB:2,HCT:2,PLT:2 in the last 72 hours BMET No results found for this basename: NA:2,K:2,CL:2,CO2:2,GLUCOSE:2,BUN:2,CREATININE:2,CALCIUM:2 in the last 72 hours  Studies/Results: Dg Lumbar Spine 2-3 Views  05/06/2011  *RADIOLOGY REPORT*  Clinical Data: 61 year old female undergoing lumbar surgery.  LUMBAR SPINE - 2-3 VIEW  Comparison: CT lumbar spine 03/12/2011.  Fluoroscopy time of  minutes was utilized.  Findings: L1 level to L4 level transpedicular hardware now is in place with posterior connecting rods.  Stable appearance of interbody implant from L2-L3 caudally.  IMPRESSION: Cephalad extension of posterior lumbar fusion and transpedicular hardware replaced at L4.  Original Report Authenticated By: Harley Hallmark, M.D.    Assessment/Plan: Postop day 1 from L1-L4 fusion pseudo- arthrosis doing very well slowly mobilizing  LOS: 1 day     Akoni Parton P 05/07/2011, 7:30 AM

## 2011-05-07 NOTE — Progress Notes (Signed)
Occupational Therapy Evaluation Patient Details Name: [name] MRN: [id number] DOB: [birth year] Today's Date: 05/07/2011  Problem List: There is no problem list on file for this patient.   Past Medical History:  Past Medical History  Diagnosis Date  . Hepatitis     "c"   Past Surgical History:  Past Surgical History  Procedure Date  . Back surgery     x4  . Abdominal hysterectomy   . Cholecystectomy     OT Assessment/Plan/Recommendation OT Assessment Clinical Impression Statement: Pt s/p posterior lumbar fusion thus affecting PLOF. Will benefit from acute OT services to address below problem list in prep for d/c home with HHOT. OT Recommendation/Assessment: Patient will need skilled OT in the acute care venue OT Problem List: Decreased activity tolerance;Impaired balance (sitting and/or standing);Decreased cognition;Decreased knowledge of use of DME or AE;Decreased knowledge of precautions;Pain OT Therapy Diagnosis : Generalized weakness;Acute pain OT Plan OT Frequency: Min 2X/week OT Treatment/Interventions: Self-care/ADL training;DME and/or AE instruction;Therapeutic activities;Patient/family education;Balance training OT Recommendation Follow Up Recommendations: Home health OT Equipment Recommended: None recommended by OT Individuals Consulted Consulted and Agree with Results and Recommendations: Patient OT Goals Acute Rehab OT Goals OT Goal Formulation: With patient Time For Goal Achievement: 7 days ADL Goals Pt Will Perform Grooming: Standing at sink;with supervision ADL Goal: Grooming - Progress: Goal set today Pt Will Perform Lower Body Bathing: Sit to stand from chair;Sit to stand from bed;with adaptive equipment;with supervision ADL Goal: Lower Body Bathing - Progress: Goal set today Pt Will Perform Lower Body Dressing: Sit to stand from chair;Sit to stand from bed;with adaptive equipment;with supervision ADL Goal: Lower Body Dressing - Progress: Goal set  today Pt Will Transfer to Toilet: with supervision;with DME;3-in-1;Maintaining back safety precautions ADL Goal: Toilet Transfer - Progress: Goal set today Pt Will Perform Toileting - Clothing Manipulation: with supervision;Standing ADL Goal: Toileting - Clothing Manipulation - Progress: Goal set today Pt Will Perform Toileting - Hygiene: with supervision;Sitting on 3-in-1 or toilet;Standing at 3-in-1/toilet ADL Goal: Toileting - Hygiene - Progress: Goal set today Pt Will Perform Tub/Shower Transfer: Shower transfer;with supervision;with DME;Shower seat with back;Ambulation;Maintaining back safety precautions ADL Goal: Tub/Shower Transfer - Progress: Goal set today Miscellaneous OT Goals Miscellaneous OT Goal #1: Pt will perform bed mobility with supervision in prep for EOB ADLs. OT Goal: Miscellaneous Goal #1 - Progress: Goal set today Miscellaneous OT Goal #2: Pt will don/doff brace with supervision sitting EOB in prep for ADL activity. OT Goal: Miscellaneous Goal #2 - Progress: Goal set today  OT Evaluation Precautions/Restrictions  Precautions Precautions: Back Precaution Booklet Issued: Yes (comment) Precaution Comments: pt educated on 3/3 back precautions Required Braces or Orthoses: Yes Spinal Brace: Lumbar corset;Applied in sitting position Restrictions Weight Bearing Restrictions: No Prior Functioning Home Living Lives With: Spouse Receives Help From: Family Type of Home: House Home Layout: One level Home Access: Stairs to enter Entrance Stairs-Rails: None Entrance Stairs-Number of Steps: 1 Bathroom Shower/Tub: Psychologist, counselling;Door (step into shower) Bathroom Toilet: Standard Bathroom Accessibility: Yes How Accessible: Accessible via walker Home Adaptive Equipment: Bedside commode/3-in-1;Walker - rolling Prior Function Level of Independence: Independent with basic ADLs;Independent with gait ADL ADL Lower Body Bathing: Simulated;Moderate assistance Lower Body  Bathing Details (indicate cue type and reason): Unable to cross ankles over knees to reach feet. Where Assessed - Lower Body Bathing: Sit to stand from chair Lower Body Dressing: Simulated;Moderate assistance Lower Body Dressing Details (indicate cue type and reason): Unable to cross ankles over knees to reach feet. Where Assessed -  Lower Body Dressing: Sit to stand from chair Toilet Transfer: Performed;Minimal assistance Toilet Transfer Details (indicate cue type and reason): Min assist for safety due to decreased arousal level and safety using RW. Toilet Transfer Method: Proofreader: Programme researcher, broadcasting/film/video Manipulation: Performed;Minimal assistance Toileting - Clothing Manipulation Details (indicate cue type and reason): Min assist to hold gown up over hips Where Assessed - Glass blower/designer Manipulation: Standing Toileting - Hygiene: Performed;Minimal assistance Toileting - Hygiene Details (indicate cue type and reason): assist for steadying Where Assessed - Toileting Hygiene: Standing Equipment Used: Rolling walker Ambulation Related to ADLs: Pt ambulated to door with min assist for steadying and safe use of RW and continuous max verbal cueing to keep eyes open and stay awake due to recent pain medication. ADL Comments: Pt donned brace with total assist sitting EOB. Educated pt on LB dressing/bathing technique while adhering to back precautions. Vision/Perception    Cognition Cognition Arousal/Alertness: Lethargic Overall Cognitive Status: Difficult to assess Difficult to assess due to: level of arousal Orientation Level: Oriented X4 Sensation/Coordination   Extremity Assessment RUE Assessment RUE Assessment: Within Functional Limits LUE Assessment LUE Assessment: Within Functional Limits Mobility  Bed Mobility Bed Mobility: Yes Sit to Sidelying Left: 2: Max assist;With rail;HOB flat Sit to Sidelying Left Details (indicate cue type and  reason): Assist to support trunk and legs into bed while maintaining back precautions. Transfers Transfers: Yes Sit to Stand: 4: Min assist;From chair/3-in-1;With armrests;With upper extremity assist Sit to Stand Details (indicate cue type and reason): cueing for safe hand placement and sequencing. Stand to Sit: 4: Min assist;To bed;With upper extremity assist Stand to Sit Details: cueing for technique and hand placement Exercises   End of Session OT - End of Session Equipment Utilized During Treatment: Gait belt;Back brace Activity Tolerance: Patient limited by fatigue;Other (comment) (lethargic) Patient left: in bed;with family/visitor present;with call bell in reach;with bed alarm set Nurse Communication: Mobility status for transfers;Mobility status for ambulation General Behavior During Session: Lethargic Cognition: WFL for tasks performed (max verbal cues for safety due to decreased arousal )   4:35 PM  05/07/2011 Cipriano Mile OTR/L Pager [phone] Office [phone]

## 2011-05-07 NOTE — Progress Notes (Signed)
UR COMPLETED  

## 2011-05-08 MED ORDER — WHITE PETROLATUM GEL: Status: DC | PRN

## 2011-05-08 MED ORDER — WHITE PETROLATUM GEL
Status: AC
  Administered 2011-05-08: 11:00:00
  Filled 2011-05-08: qty 5

## 2011-05-08 MED ORDER — MAGNESIUM HYDROXIDE 400 MG/5ML PO SUSP
30.0000 mL | Freq: Every day | ORAL | Status: DC | PRN
  Administered 2011-05-09: 30 mL via ORAL
  Filled 2011-05-08: qty 30

## 2011-05-08 MED ORDER — POLYETHYLENE GLYCOL 3350 17 G PO PACK
17.0000 g | PACK | Freq: Every day | ORAL | Status: DC
  Administered 2011-05-08 – 2011-05-10 (×3): 17 g via ORAL
  Filled 2011-05-08 (×4): qty 1

## 2011-05-08 NOTE — Progress Notes (Signed)
Pt ambulated around circle to nurses' station and back to her room. Some pain present, but pt tolerated very well.

## 2011-05-08 NOTE — Progress Notes (Signed)
Physical Therapy Treatment Patient Details Name: [name] MRN: [id number] DOB: [birth year] Today's Date: 05/08/2011  PT Assessment/Plan  PT - Assessment/Plan Comments on Treatment Session: Pt progressing well, less fatigued this session. Pt still has difficulty with getting into bed and requires cueing throughout to maintain precautions PT Plan: Discharge plan remains appropriate;Frequency remains appropriate PT Frequency: Min 5X/week Follow Up Recommendations: Home health PT;Supervision - Intermittent Equipment Recommended: None recommended by OT;None recommended by PT PT Goals  Acute Rehab PT Goals PT Goal Formulation: With patient/family PT Goal: Supine/Side to Sit - Progress: Progressing toward goal PT Goal: Sit to Supine/Side - Progress: Progressing toward goal PT Goal: Sit to Stand - Progress: Progressing toward goal PT Goal: Stand to Sit - Progress: Progressing toward goal PT Transfer Goal: Bed to Chair/Chair to Bed - Progress: Progressing toward goal PT Goal: Ambulate - Progress: Progressing toward goal  PT Treatment Precautions/Restrictions  Precautions Precautions: Back Precaution Booklet Issued: Yes (comment) Precaution Comments: Pt able to state 2/3 back precautions Required Braces or Orthoses: Yes Spinal Brace: Lumbar corset;Applied in sitting position Restrictions Weight Bearing Restrictions: No Mobility (including Balance) Bed Mobility Bed Mobility: Yes Rolling Left: 5: Supervision;With rail Rolling Left Details (indicate cue type and reason): VC for sequencing to maintain back precautions Left Sidelying to Sit: 5: Supervision Left Sidelying to Sit Details (indicate cue type and reason): VC for sequencing to maintain back precautions Sitting - Scoot to Edge of Bed: 6: Modified independent (Device/Increase time) Sitting - Scoot to Edge of Bed Details (indicate cue type and reason): Assist to support trunk and legs OOB Sit to Sidelying Left: 4: Min  assist;With rail;HOB elevated (comment degrees) Sit to Sidelying Left Details (indicate cue type and reason): VC for sequencing. Assist with LEs into bed Transfers Transfers: Yes Sit to Stand: 5: Supervision;4: Min assist;With upper extremity assist;From bed;From chair/3-in-1 Sit to Stand Details (indicate cue type and reason): Min assist from bed, Supervision from 3-1. VC for hand placement and sequencing Stand to Sit: 5: Supervision;With upper extremity assist;To chair/3-in-1;To bed Stand to Sit Details: VC for hand placement. Pt able to control descent Ambulation/Gait Ambulation/Gait: Yes Ambulation/Gait Assistance: 4: Min assist (Minguard assist) Ambulation/Gait Assistance Details (indicate cue type and reason): VC for safe distance to RW as well as hand placement on RW for safety throughout gait trial. Postural cues throughout Ambulation Distance (Feet): 75 Feet Assistive device: Rolling walker Gait Pattern: Step-to pattern;Trunk flexed;Decreased trunk rotation;Decreased hip/knee flexion - right;Decreased hip/knee flexion - left;Decreased stride length Gait velocity: decreased cadence Stairs: No    Exercise    End of Session PT - End of Session Equipment Utilized During Treatment: Gait belt;Back brace Activity Tolerance: Patient tolerated treatment well Patient left: in bed;with call bell in reach;with family/visitor present Nurse Communication: Mobility status for transfers;Mobility status for ambulation General Behavior During Session: [facility] for tasks performed Cognition: [facility] for tasks performed  Milana Kidney 05/08/2011, 3:51 PM  05/08/2011 Milana Kidney DPT PAGER: [phone] OFFICE: [phone]

## 2011-05-08 NOTE — Progress Notes (Signed)
Patient ID: [name], female   DOB: [birth year], 61 y.o.   MRN: [id number] Patient looks quite good. Slowly increasing activity. Will increase as tolerated. Will keep drains til tomorrow. No new neuro issues. Seems quite motivated!

## 2011-05-08 NOTE — Progress Notes (Signed)
Occupational Therapy Treatment Patient Details Name: [name] MRN: [id number] DOB: Oct 26, 1950 Today's Date: 05/08/2011  OT Assessment/Plan OT Assessment/Plan Comments on Treatment Session: Pt progressing toward goals.  Requires min assist with transfers for safe use of RW. OT Plan: Discharge plan remains appropriate OT Frequency: Min 2X/week Follow Up Recommendations: Home health OT Equipment Recommended: None recommended by OT;None recommended by PT OT Goals Acute Rehab OT Goals OT Goal Formulation: With patient Time For Goal Achievement: 7 days ADL Goals Pt Will Perform Grooming: Standing at sink;with supervision ADL Goal: Grooming - Progress: Progressing toward goals Pt Will Perform Lower Body Bathing: Sit to stand from chair;Sit to stand from bed;with adaptive equipment;with supervision ADL Goal: Lower Body Bathing - Progress: Progressing toward goals Pt Will Perform Lower Body Dressing: Sit to stand from chair;Sit to stand from bed;with adaptive equipment;with supervision ADL Goal: Lower Body Dressing - Progress: Progressing toward goals Pt Will Transfer to Toilet: with supervision;with DME;3-in-1;Maintaining back safety precautions ADL Goal: Toilet Transfer - Progress: Progressing toward goals Pt Will Perform Toileting - Clothing Manipulation: with supervision;Standing ADL Goal: Toileting - Clothing Manipulation - Progress: Progressing toward goals Pt Will Perform Toileting - Hygiene: with supervision;Sitting on 3-in-1 or toilet;Standing at 3-in-1/toilet ADL Goal: Toileting - Hygiene - Progress: Met Miscellaneous OT Goals Miscellaneous OT Goal #1: Pt will perform bed mobility with supervision in prep for EOB ADLs. OT Goal: Miscellaneous Goal #1 - Progress: Progressing toward goals Miscellaneous OT Goal #2: Pt will don/doff brace with supervision sitting EOB in prep for ADL activity. OT Goal: Miscellaneous Goal #2 - Progress: Progressing toward goals  OT  Treatment Precautions/Restrictions  Precautions Precautions: Back Precaution Booklet Issued: Yes (comment) Precaution Comments: Pt able to state 2/3 back precautions Required Braces or Orthoses: Yes Spinal Brace: Lumbar corset;Applied in sitting position Restrictions Weight Bearing Restrictions: No   ADL ADL Grooming: Performed;Wash/dry hands;Supervision/safety Grooming Details (indicate cue type and reason): Cues for upright posture. Where Assessed - Grooming: Standing at sink Lower Body Bathing: Simulated;Minimal assistance Lower Body Bathing Details (indicate cue type and reason): Pt able to cross ankles over knees today but with some discomfort. Where Assessed - Lower Body Bathing: Sit to stand from bed Lower Body Dressing: Simulated;Minimal assistance Lower Body Dressing Details (indicate cue type and reason): Pt able to cross ankles over knees today but with some discomfort. Where Assessed - Lower Body Dressing: Sit to stand from bed Toilet Transfer: Performed;Minimal assistance Toilet Transfer Details (indicate cue type and reason): Min assist for safe use of RW and cueing for hand placment Toilet Transfer Method: Ambulating Toilet Transfer Equipment: Raised toilet seat with arms (or 3-in-1 over toilet) Toileting - Clothing Manipulation: Performed;Supervision/safety Toileting - Clothing Manipulation Details (indicate cue type and reason): Pt pulled gown up over hips. Where Assessed - Toileting Clothing Manipulation: Standing Toileting - Hygiene: Performed;Modified independent Where Assessed - Toileting Hygiene: Sit on 3-in-1 or toilet Equipment Used: Rolling walker ADL Comments: Pt donned/doffed brace with min assist. sitting EOB. Mobility  Bed Mobility Bed Mobility: Yes Rolling Left: 5: Supervision;With rail Sitting - Scoot to Edge of Bed: 4: Min assist;With rail Sitting - Scoot to [place] of Bed Details (indicate cue type and reason): Assist to support trunk and legs  OOB Sit to Sidelying Left: 4: Min assist;With rail;HOB flat Sit to Sidelying Left Details (indicate cue type and reason): Assist to maintain back precautions  Transfers Transfers: Yes Sit to Stand: From bed;4: Min assist;With upper extremity assist;From chair/3-in-1 Sit to Stand Details (indicate cue type  and reason): Cueing for safe hand placement. Assist for steadying. Stand to Sit: 4: Min assist;To bed;To chair/3-in-1;With armrests;With upper extremity assist Stand to Sit Details: Assist to initiate transfer and cueing for hand placement. Exercises    End of Session OT - End of Session Equipment Utilized During Treatment: Gait belt;Back brace Activity Tolerance: Patient limited by pain Patient left: in bed;with call bell in reach Nurse Communication: Mobility status for transfers General Behavior During Session: [facility] for tasks performed Cognition: [facility] for tasks performed   3:32 PM 05/08/2011 Cipriano Mile OTR/L Pager [phone] Office [phone]

## 2011-05-09 ENCOUNTER — Inpatient Hospital Stay (HOSPITAL_COMMUNITY): Payer: Medicare Other

## 2011-05-09 LAB — DIFFERENTIAL
Basophils Absolute: 0 10*3/uL (ref 0.0–0.1)
Basophils Relative: 0 % (ref 0–1)
Eosinophils Absolute: 0 10*3/uL (ref 0.0–0.7)
Lymphocytes Relative: 27 % (ref 12–46)
Monocytes Absolute: 0.6 10*3/uL (ref 0.1–1.0)
Neutro Abs: 3.5 10*3/uL (ref 1.7–7.7)
Neutrophils Relative %: 63 % (ref 43–77)

## 2011-05-09 LAB — URINALYSIS, MICROSCOPIC ONLY
Hgb urine dipstick: NEGATIVE
Nitrite: NEGATIVE
Protein, ur: NEGATIVE mg/dL
Specific Gravity, Urine: 1.02 (ref 1.005–1.030)
Urobilinogen, UA: 4 mg/dL — ABNORMAL HIGH (ref 0.0–1.0)

## 2011-05-09 LAB — CBC
MCH: 30.2 pg (ref 26.0–34.0)
MCHC: 33.2 g/dL (ref 30.0–36.0)
RDW: 13.7 % (ref 11.5–15.5)

## 2011-05-09 NOTE — Progress Notes (Signed)
Patient ID: [name], female   DOB: [birth year], 61 y.o.   MRN: [id number] Subjective:  The patient is alert and pleasant. Her back is appropriately is sore. She wants to go home tomorrow.  Objective: Vital signs in last 24 hours: Temp:  [98.2 F (36.8 C)-100.9 F (38.3 C)] 98.2 F (36.8 C) (03/30 0548) Pulse Rate:  [88-95] 95  (03/30 0945) Resp:  [18-20] 18  (03/30 0945) BP: (103-115)/(45-67) 108/57 mmHg (03/30 0945) SpO2:  [88 %-100 %] 100 % (03/30 0945)  Intake/Output from previous day: 03/29 0701 - 03/30 0700 In: 1210 [P.O.:1210] Out: 130 [Drains:130] Intake/Output this shift:    Physical exam the patient is alert and pleasant. She is moving her lower extremities well. One drain was moved removed today. The other has put out 50 cc.  Lab Results: No results found for this basename: WBC:2,HGB:2,HCT:2,PLT:2 in the last 72 hours BMET No results found for this basename: NA:2,K:2,CL:2,CO2:2,GLUCOSE:2,BUN:2,CREATININE:2,CALCIUM:2 in the last 72 hours  Studies/Results: No results found.  Assessment/Plan: Postop day #3: The patient seems to be making some progress. She will possibly go home tomorrow.  Low-grade fever: We will check a chest x-ray, urinalysis, blood cultures and CBC.  LOS: 3 days     Kianah Harries D 05/09/2011, 11:33 AM

## 2011-05-09 NOTE — Progress Notes (Signed)
Physical Therapy Treatment Patient Details Name: [name] MRN: [id number] DOB: November 14, 1950 Today's Date: 05/09/2011  PT Assessment/Plan  PT - Assessment/Plan Comments on Treatment Session: great progress today with increased gait distance and stair education completed. Still needs cues to follow/adhere to back precautions. PT Plan: Discharge plan remains appropriate;Frequency remains appropriate PT Frequency: Min 5X/week Follow Up Recommendations: Home health PT;Supervision - Intermittent Equipment Recommended: None recommended by PT;None recommended by OT PT Goals  Acute Rehab PT Goals PT Goal: Supine/Side to Sit - Progress: Progressing toward goal PT Goal: Sit to Stand - Progress: Progressing toward goal PT Goal: Stand to Sit - Progress: Progressing toward goal PT Goal: Ambulate - Progress: Progressing toward goal PT Goal: Up/Down Stairs - Progress: Met  PT Treatment Precautions/Restrictions  Precautions Precautions: Back Precaution Booklet Issued: Yes (comment) Precaution Comments: pt able to state 3/3 precautions. needs max cues to demo with mobility (pt lying with twisted back upon entering room and needed cues to demo precauitions with mobility) Required Braces or Orthoses: Yes Spinal Brace: Lumbar corset;Applied in sitting position (supervision with min cues to don correctly) Restrictions Weight Bearing Restrictions: No Mobility (including Balance) Bed Mobility Rolling Right: 5: Supervision Rolling Right Details (indicate cue type and reason): no rail and bed flat. cues to not twist with rolling (for logroll technique). Right Sidelying to Sit: 5: Supervision;HOB flat Right Sidelying to Sit Details (indicate cue type and reason): cues for hand placement and technique to adhere to back precautions (so not to twist with sitting up). Sitting - Scoot to Edge of Bed: 6: Modified independent (Device/Increase time) Sitting - Scoot to Edge of Bed Details (indicate cue type and  reason): no assist needed.  increased time needed. Transfers Sit to Stand: 4: Min assist;With upper extremity assist;From bed (min guard assist) Sit to Stand Details (indicate cue type and reason): x2 trials. cues for ant wt shift and to push with legs to achieve standing. cues for erect posture once standing. Stand to Sit: 5: Supervision;To chair/3-in-1;With upper extremity assist;With armrests Stand to Sit Details: no assit needed. cues to reach back and use arms to control descent with sitting down. Ambulation/Gait Ambulation/Gait Assistance: 5: Supervision Ambulation/Gait Assistance Details (indicate cue type and reason): cues for hand placement on walker, upright posture and for walker postion with gait and turning. cues for increased step length on bil feet, pt able to return demo for last 90 feet, Ambulation Distance (Feet): 130 Feet Assistive device: Rolling walker Gait Pattern: Step-through pattern;Decreased stride length;Trunk flexed (min cues to increase step length bilaterally, pt return demo) Gait velocity: decreased intially, increased as pt's step length/stride length increased. Stairs: Yes Stairs Assistance: 4: Min assist Stairs Assistance Details (indicate cue type and reason): min assist to steady walker only. cues for technique and sequency. Stair Management Technique: Backwards;With walker Number of Stairs: 1  (1 step x3 trials.)  Posture/Postural Control Posture/Postural Control: No significant limitations  End of Session PT - End of Session Equipment Utilized During Treatment: Gait belt;Back brace Activity Tolerance: Patient tolerated treatment well Patient left: in chair;with call bell in reach;with family/visitor present Nurse Communication: Mobility status for transfers;Mobility status for ambulation General Behavior During Session: [facility] for tasks performed Cognition: [facility] for tasks performed  Sallyanne Kuster 05/09/2011, 11:28 AM  Sallyanne Kuster, PTA Office-  [phone] Pager- [phone]

## 2011-05-10 LAB — URINE CULTURE
Colony Count: NO GROWTH
Culture: NO GROWTH

## 2011-05-10 MED ORDER — CYCLOBENZAPRINE HCL 10 MG PO TABS: 10.0000 mg | ORAL_TABLET | Freq: Three times a day (TID) | ORAL | Status: AC | PRN

## 2011-05-10 MED ORDER — HYDROCODONE-ACETAMINOPHEN 10-325 MG PO TABS: 1.0000 | ORAL_TABLET | Freq: Four times a day (QID) | ORAL | Status: AC | PRN

## 2011-05-10 NOTE — Discharge Summary (Signed)
Physician Discharge Summary  Patient ID: [name] MRN: [id number] DOB/AGE: [birth year] 61 y.o.  Admit date: 05/06/2011 Discharge date: 05/10/2011  Admission Diagnoses:  Discharge Diagnoses:  Active Problems:  * No active hospital problems. *    Discharged Condition: good  Hospital Course: Surgery with multiple level back fusion. Did well. Slowly increased activity. Wound healed well. By fifth day post op, up, ambulating well. Pain controlled with po meds. Discharged home with specific instructions.  Consults: None  Significant Diagnostic Studies: none  Treatments: Multi level lumbar fusion with pedicle screws  Discharge Exam: Blood pressure 96/55, pulse 81, temperature 99.6 F (37.6 C), temperature source Oral, resp. rate 18, height 5\' 6"  (1.676 m), weight 81.103 kg (178 lb 12.8 oz), SpO2 96.00%. Incision/Wound:healing well with no drainage  Disposition:   Discharge Orders    Future Orders Please Complete By Expires   Diet general      Discharge instructions      Comments:   Mostly bedrest. Get up 9 or 10 times each day and walk for 15-20 minutes each time. Very little sitting the first week. No riding in the car until your first post op appointment. If you had neck surgery...may shower from the chest down. If you had low back surgery....you may shower with a saran wrap covering over the incision. Take your pain medicine as needed...and other medicines that you are instructed to take. Call for an appointment...[phone].   Call MD for:  temperature >100.4      Call MD for:  persistant nausea and vomiting      Call MD for:  severe uncontrolled pain      Call MD for:  redness, tenderness, or signs of infection (pain, swelling, redness, odor or green/yellow discharge around incision site)      Call MD for:  difficulty breathing, headache or visual disturbances      Call MD for:  hives        Medication List  As of 05/10/2011 10:15 AM   STOP taking these medications        traMADol 50 MG tablet         TAKE these medications         cyclobenzaprine 10 MG tablet   Commonly known as: FLEXERIL   Take 1 tablet (10 mg total) by mouth 3 (three) times daily as needed for muscle spasms.      fluticasone 50 MCG/ACT nasal spray   Commonly known as: FLONASE   Place 2 sprays into the nose daily.      gabapentin 300 MG capsule   Commonly known as: NEURONTIN   Take 300 mg by mouth as needed.      HYDROcodone-acetaminophen 10-325 MG per tablet   Commonly known as: NORCO   Take 1-2 tablets by mouth every 6 (six) hours as needed. For pain      HYDROcodone-acetaminophen 10-325 MG per tablet   Commonly known as: NORCO   Take 1-2 tablets by mouth every 6 (six) hours as needed.             At home rest most of the time. Get up 9 or 10 times each day and take a 15 or 20 minute walk. No riding in the car and to your first postoperative appointment. If you have neck surgery you may shower from the chest down starting on the third postoperative day. If you had back surgery he may start showering on the third postoperative day with saran wrap wrapped  around your incisional area 3 times. After the shower remove the saran wrap. Take pain medicine as needed and other medications as instructed. Call my office for an appointment.  SignedReinaldo Meeker, MD 05/10/2011, 10:15 AM

## 2011-05-10 NOTE — Progress Notes (Signed)
Pt discharged to home. All post op education completed and medications reviewed. All queeries answered. Elmer Sow, RN

## 2011-05-11 MED FILL — Sodium Chloride Irrigation Soln 0.9%: Qty: 3000 | Status: AC

## 2011-05-11 MED FILL — Heparin Sodium (Porcine) Inj 1000 Unit/ML: INTRAMUSCULAR | Qty: 30 | Status: AC

## 2011-05-11 MED FILL — Sodium Chloride IV Soln 0.9%: INTRAVENOUS | Qty: 1000 | Status: AC

## 2011-05-11 NOTE — Progress Notes (Signed)
CARE MANAGEMENT NOTE 05/11/2011  Patient:  [name], [name]   Account Number:  [id number]  Date Initiated:  05/11/2011  Documentation initiated by:  Vance Peper  Subjective/Objective Assessment:     Action/Plan:   Called patient at home regarding [facility] needs. Family with her. Choice offered. States she doesnt want to go to SNF.Called referral for HHPT.   Anticipated DC Date:  05/10/2011   Anticipated DC Plan:  HOME W HOME HEALTH SERVICES      DC Planning Services  CM consult      [facility] Choice  HOME HEALTH   Choice offered to / List presented to:  C-1 Patient   DME arranged  NA      DME agency  NA     HH arranged  HH-2 PT      HH agency  [facility]   Status of service:  Completed, signed off Medicare Important Message given?   (If response is "NO", the following Medicare IM given date fields will be blank) Date Medicare IM given:   Date Additional Medicare IM given:    Discharge Disposition:  HOME W HOME HEALTH SERVICES  Per UR Regulation:    If discussed at Long Length of Stay Meetings, dates discussed:    Comments:

## 2011-05-15 LAB — CULTURE, BLOOD (ROUTINE X 2)
Culture  Setup Time: 201303301705
Culture: NO GROWTH

## 2011-08-28 ENCOUNTER — Emergency Department (HOSPITAL_COMMUNITY): Payer: Medicare Other

## 2011-08-28 ENCOUNTER — Encounter (HOSPITAL_COMMUNITY): Payer: Self-pay | Admitting: *Deleted

## 2011-08-28 ENCOUNTER — Emergency Department (HOSPITAL_COMMUNITY)
Admission: EM | Admit: 2011-08-28 | Discharge: 2011-08-28 | Disposition: A | Discharge status: Home / Self Care | Payer: Medicare Other | Attending: Emergency Medicine | Admitting: Emergency Medicine

## 2011-08-28 DIAGNOSIS — M5416 Radiculopathy, lumbar region: Secondary | ICD-10-CM

## 2011-08-28 DIAGNOSIS — G8929 Other chronic pain: Secondary | ICD-10-CM | POA: Insufficient documentation

## 2011-08-28 DIAGNOSIS — Z9089 Acquired absence of other organs: Secondary | ICD-10-CM | POA: Insufficient documentation

## 2011-08-28 DIAGNOSIS — IMO0002 Reserved for concepts with insufficient information to code with codable children: Secondary | ICD-10-CM | POA: Insufficient documentation

## 2011-08-28 DIAGNOSIS — F17200 Nicotine dependence, unspecified, uncomplicated: Secondary | ICD-10-CM | POA: Insufficient documentation

## 2011-08-28 LAB — URINALYSIS, ROUTINE W REFLEX MICROSCOPIC
Hgb urine dipstick: NEGATIVE
Nitrite: NEGATIVE
Protein, ur: NEGATIVE mg/dL
Specific Gravity, Urine: 1.019 (ref 1.005–1.030)
Urobilinogen, UA: 1 mg/dL (ref 0.0–1.0)

## 2011-08-28 LAB — CBC WITH DIFFERENTIAL/PLATELET
Basophils Absolute: 0 10*3/uL (ref 0.0–0.1)
Eosinophils Relative: 1 % (ref 0–5)
Lymphocytes Relative: 45 % (ref 12–46)
Lymphs Abs: 3.2 10*3/uL (ref 0.7–4.0)
MCV: 91.5 fL (ref 78.0–100.0)
Neutro Abs: 3.5 10*3/uL (ref 1.7–7.7)
Neutrophils Relative %: 49 % (ref 43–77)
Platelets: 218 10*3/uL (ref 150–400)
RBC: 4.13 MIL/uL (ref 3.87–5.11)
RDW: 13.2 % (ref 11.5–15.5)
WBC: 7.2 10*3/uL (ref 4.0–10.5)

## 2011-08-28 LAB — COMPREHENSIVE METABOLIC PANEL
ALT: 20 U/L (ref 0–35)
AST: 30 U/L (ref 0–37)
Alkaline Phosphatase: 113 U/L (ref 39–117)
CO2: 20 mEq/L (ref 19–32)
Calcium: 9.9 mg/dL (ref 8.4–10.5)
GFR calc non Af Amer: >90 mL/min (ref >90)
Glucose, Bld: 89 mg/dL (ref 70–99)
Potassium: 4.1 mEq/L (ref 3.5–5.1)
Sodium: 137 mEq/L (ref 135–145)

## 2011-08-28 LAB — URINE MICROSCOPIC-ADD ON

## 2011-08-28 MED ORDER — MELOXICAM 7.5 MG PO TABS: 7.5000 mg | ORAL_TABLET | Freq: Two times a day (BID) | ORAL | Status: DC

## 2011-08-28 MED ORDER — HYDROMORPHONE HCL PF 1 MG/ML IJ SOLN
1.0000 mg | Freq: Once | INTRAMUSCULAR | Status: AC
  Administered 2011-08-28: 1 mg via INTRAMUSCULAR
  Filled 2011-08-28: qty 1

## 2011-08-28 NOTE — ED Notes (Signed)
Pt ambulated with a steady gait;VSS; A&Ox3; no signs of distress; respirations even and unlabored; skin warm and dry; no questions at this time.  

## 2011-08-28 NOTE — ED Notes (Signed)
Patient transported to CT 

## 2011-08-28 NOTE — ED Notes (Signed)
The pt has had lower back pain for the past 2 days .  She saw dr cram yesterday and he increased her pain pills.  She is still in pain

## 2011-08-28 NOTE — ED Provider Notes (Signed)
Medical screening examination/treatment/procedure(s) were performed by non-physician practitioner and as supervising physician I was immediately available for consultation/collaboration.   Roselyne Stalnaker, MD 08/28/11 2349 

## 2011-08-28 NOTE — ED Provider Notes (Signed)
History     CSN: [id number]  Arrival date & time 08/28/11  1759   None     Chief Complaint  Patient presents with  . 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 Wynetta Emery 05/06/2011 but is still having pain Was seen by Dr. Wynetta Emery yesterday and had Vicodin dose increase to 10 mg but this is still not helping.  It was suggested that patinet have an outpatient CT Scan but she can not wait due to the pain   Patient is a 61 y.o. female presenting with back pain. The history is provided by the patient.  Back Pain  This is a chronic problem. The current episode started more than 1 week ago. The problem occurs constantly. The problem has been gradually worsening. The pain is associated with no known injury. The pain is present in the lumbar spine. The quality of the pain is described as aching. The pain radiates to the left thigh. The pain is at a severity of 7/10. The pain is moderate. The symptoms are aggravated by certain positions. Associated symptoms include numbness. Pertinent negatives include no fever and no weakness. The treatment provided no relief.    Past Medical History  Diagnosis Date  . Hepatitis     "c"    Past Surgical History  Procedure Date  . Back surgery     x4  . Abdominal hysterectomy   . Cholecystectomy     No family history on file.  History  Substance Use Topics  . Smoking status: Current Everyday Smoker -- 1.0 packs/day for 25 years    Types: Cigarettes  . Smokeless tobacco: Not on file  . Alcohol Use: No    OB History    Grav Para Term Preterm Abortions TAB SAB Ect Mult Living                  Review of Systems  Constitutional: Negative for fever and chills.  Musculoskeletal: Positive for back pain and joint swelling.  Neurological: Positive for numbness. Negative for dizziness and weakness.     Allergies  Review of patient's allergies indicates no known allergies.  Home Medications   Current Outpatient Rx  Name Route Sig Dispense Refill  . GABAPENTIN 300 MG PO CAPS Oral Take 300 mg by mouth 3 (three) times daily as needed. Taking for leg pain    . HYDROCODONE-ACETAMINOPHEN 10-325 MG PO TABS Oral Take 1-2 tablets by mouth every 6 (six) hours as needed. For pain    . MELOXICAM 7.5 MG PO TABS Oral Take 1 tablet (7.5 mg total) by mouth 2 (two) times daily. 60 tablet 0    BP 183/87  Pulse 77  Temp 98.4 F (36.9 C) (Oral)  Resp 18  SpO2 94%  Physical Exam  Constitutional: She is oriented to person, place, and time. She appears well-developed and well-nourished.  HENT:  Head: Normocephalic.  Eyes: Pupils are equal, round, and reactive to light.  Neck: Normal range of motion.  Cardiovascular: Normal rate.   Pulmonary/Chest: Effort normal.  Musculoskeletal: She exhibits tenderness. She exhibits no edema.       Lumbar back: She exhibits decreased range of motion, tenderness and pain. She exhibits no swelling, no edema, no laceration and no spasm.       Arms: Neurological: She is alert and oriented to person, place, and time.  Skin: Skin is warm. No rash  noted. No erythema.    ED Course  Procedures (including critical care time)  Labs Reviewed  URINALYSIS, ROUTINE W REFLEX MICROSCOPIC - Abnormal; Notable for the following:    Leukocytes, UA SMALL (*)     All other components within normal limits  COMPREHENSIVE METABOLIC PANEL - Abnormal; Notable for the following:    Total Protein 8.9 (*)     Total Bilirubin 0.2 (*)     All other components within normal limits  URINE MICROSCOPIC-ADD ON - Abnormal; Notable for the following:    Squamous Epithelial / LPF FEW (*)     All other components within normal limits  CBC WITH DIFFERENTIAL   Ct Lumbar Spine Wo Contrast  08/28/2011  *RADIOLOGY REPORT*  Clinical Data: Low back pain for 2 days.  Previous back surgery.  CT  LUMBAR SPINE WITHOUT CONTRAST  Technique:  Multidetector CT imaging of the lumbar spine was performed without intravenous contrast administration. Multiplanar CT image reconstructions were also generated.  Comparison: 03/12/2011  Findings: Postoperative changes with laminectomies from L2-3 to L5 S1 level and posterior rod and screw fixation from L1-L4. Intervertebral spacers at L2-3, L3-4, L4-5, and L5-S1 levels.  No significant change in the appearance or position of the hardware. No significant change in alignment of fused segments.  The L1-2 level demonstrates a narrowed but intact disc space.  The L2-3 level demonstrates partial fusion of the.  The L3-4 level demonstrates partial effusion, increased since the previous study. Complete appearing fusion of L4-5 and L5 S1. No vertebral compression deformities.  No focal bone lesion.  No apparent bone destruction.  Diffuse bone demineralization.  No paraspinal soft tissue infiltration.  Suggestion of diffuse bulging disc annulus with posterior ligamentous hypertrophy and L1-2 cause some narrowing of the central canal.  The lower lumbar canal appears patent.  Postoperative scarring in the area of surgery posteriorly. Incidental note of calcification of the aorta.  IMPRESSION: Postoperative changes with posterior laminectomies and effusion from L1-2 through L5-S1 levels as discussed.  Fused segments appear intact.  No significant change in alignment or hardware positioning since the previous study.  Suggestion of some compromise of the central canal at the L1-2 level due to degenerative change.  Original Report Authenticated By: Marlon Pel, M.D.     1. Chronic lumbar pain   2. Lumbar radiculopathy       MDM  Will provide pain medication while patient is getting CT scan  I discussed the finding of this patient's CT scan with her.  I have added Mobic 7.5 mg one tablet twice a day to her medication regime and recommend that she followup with Dr.Cram on  Monday       Arman Filter, NP 08/28/11 2151  Arman Filter, NP 08/28/11 2152

## 2011-11-10 ENCOUNTER — Ambulatory Visit (INDEPENDENT_AMBULATORY_CARE_PROVIDER_SITE_OTHER): Payer: Medicare Other | Admitting: Radiology

## 2011-11-10 DIAGNOSIS — Z23 Encounter for immunization: Secondary | ICD-10-CM

## 2011-11-10 NOTE — Progress Notes (Signed)
Flu vaccine only

## 2011-11-21 ENCOUNTER — Encounter (HOSPITAL_COMMUNITY): Payer: Self-pay | Admitting: *Deleted

## 2011-11-21 ENCOUNTER — Emergency Department (HOSPITAL_COMMUNITY)
Admission: EM | Admit: 2011-11-21 | Discharge: 2011-11-21 | Disposition: A | Discharge status: Home / Self Care | Payer: Medicare Other | Attending: Emergency Medicine | Admitting: Emergency Medicine

## 2011-11-21 DIAGNOSIS — G8929 Other chronic pain: Secondary | ICD-10-CM | POA: Insufficient documentation

## 2011-11-21 DIAGNOSIS — F17200 Nicotine dependence, unspecified, uncomplicated: Secondary | ICD-10-CM | POA: Insufficient documentation

## 2011-11-21 DIAGNOSIS — M549 Dorsalgia, unspecified: Secondary | ICD-10-CM | POA: Insufficient documentation

## 2011-11-21 DIAGNOSIS — Z8619 Personal history of other infectious and parasitic diseases: Secondary | ICD-10-CM | POA: Insufficient documentation

## 2011-11-21 MED ORDER — DIAZEPAM 5 MG/ML IJ SOLN: 5.0000 mg | Freq: Once | INTRAMUSCULAR | Status: DC

## 2011-11-21 MED ORDER — HYDROMORPHONE HCL PF 2 MG/ML IJ SOLN
2.0000 mg | Freq: Once | INTRAMUSCULAR | Status: AC
  Administered 2011-11-21: 2 mg via INTRAMUSCULAR
  Filled 2011-11-21: qty 1

## 2011-11-21 MED ORDER — DIAZEPAM 5 MG/ML IJ SOLN
5.0000 mg | Freq: Once | INTRAMUSCULAR | Status: AC
  Administered 2011-11-21: 5 mg via INTRAMUSCULAR
  Filled 2011-11-21: qty 2

## 2011-11-21 NOTE — ED Provider Notes (Signed)
Medical screening examination/treatment/procedure(s) were performed by non-physician practitioner and as supervising physician I was immediately available for consultation/collaboration.  Maveric Debono, MD 11/21/11 0615 

## 2011-11-21 NOTE — ED Notes (Signed)
i triaged this pt earlier her pain remains the same.  Husband at the bedside

## 2011-11-21 NOTE — ED Notes (Signed)
C/i lower back pain since Monday.  Chronic back pain with back surgeries

## 2011-11-21 NOTE — ED Notes (Signed)
Pain med given 

## 2011-11-21 NOTE — ED Provider Notes (Signed)
History     CSN: [id number]  Arrival date & time 11/21/11  0048   First MD Initiated Contact with Patient 11/21/11 [phone]      Chief Complaint  Patient presents with  . Back Pain   HPI  History provided by the patient. Patient is a 61 year old female with history of multiple lower back surgeries and chronic back pain who presents with complaints of uncontrolled back pain at home. Patient is currently being followed by Dr. Glee Arvin with neurosurgery. She is being treated with hydrocodone at home but states this has not been relieving her back pain symptoms for the past few days. Denies any new changing symptoms. Pain occasionally radiates to the buttocks. She denies any numbness weakness in lower extremities. She denies any urinary or fecal incontinence, urinary retention or perineal numbness.    Past Medical History  Diagnosis Date  . Hepatitis     "c"    Past Surgical History  Procedure Date  . Back surgery     x4  . Abdominal hysterectomy   . Cholecystectomy     No family history on file.  History  Substance Use Topics  . Smoking status: Current Every Day Smoker -- 1.0 packs/day for 25 years    Types: Cigarettes  . Smokeless tobacco: Not on file  . Alcohol Use: No    OB History    Grav Para Term Preterm Abortions TAB SAB Ect Mult Living                  Review of Systems  Constitutional: Negative for fever, chills and unexpected weight change.  HENT: Negative for neck pain.   Gastrointestinal: Negative for nausea, vomiting and abdominal pain.  Genitourinary: Negative for dysuria, frequency, hematuria and flank pain.  Musculoskeletal: Positive for back pain.  Skin: Negative for rash.  Neurological: Negative for weakness and numbness.    Allergies  Review of patient's allergies indicates no known allergies.  Home Medications   Current Outpatient Rx  Name Route Sig Dispense Refill  . GABAPENTIN 300 MG PO CAPS Oral Take 300 mg by mouth 3 (three) times daily  as needed. Taking for leg pain    . HYDROCODONE-ACETAMINOPHEN 10-325 MG PO TABS Oral Take 1-2 tablets by mouth every 6 (six) hours as needed. For pain    . MELOXICAM 7.5 MG PO TABS Oral Take 7.5 mg by mouth 2 (two) times daily as needed. For pain      BP 150/84  Pulse 75  Temp 97.6 F (36.4 C) (Oral)  Resp 20  SpO2 99%  Physical Exam  Nursing note and vitals reviewed. Constitutional: She is oriented to person, place, and time. She appears well-developed and well-nourished. No distress.  HENT:  Head: Normocephalic and atraumatic.  Neck: Normal range of motion. Neck supple.       No meningeal signs  Cardiovascular: Normal rate and regular rhythm.   No murmur heard. Pulmonary/Chest: Effort normal and breath sounds normal. No respiratory distress. She has no wheezes. She has no rales.  Abdominal: Soft. There is no tenderness. There is no rigidity, no rebound, no guarding, no CVA tenderness and no tenderness at McBurney's point.  Musculoskeletal:       Lumbar back: She exhibits tenderness.       Back:  Neurological: She is alert and oriented to person, place, and time. She has normal strength. No sensory deficit. Gait normal.  Skin: Skin is warm and dry. No rash noted.  Psychiatric: She has a  normal mood and affect. Her behavior is normal.    ED Course  Procedures      1. Chronic back pain       MDM  Patient seen and evaluated. Patient appears mild to moderately discomfort. No acute distress.  Patient given IM doses of Dilaudid and Valium. Patient now having significant improvement of symptoms. She is ready to return home. She plans to followup with Dr. Glee Arvin for continued evaluation and treatment.        Angus Seller, [place] 11/21/11 [phone]

## 2011-11-27 ENCOUNTER — Other Ambulatory Visit: Payer: Self-pay | Admitting: Neurosurgery

## 2011-11-27 DIAGNOSIS — M25552 Pain in left hip: Secondary | ICD-10-CM

## 2011-12-06 ENCOUNTER — Ambulatory Visit
Admission: RE | Admit: 2011-12-06 | Discharge: 2011-12-06 | Disposition: A | Discharge status: Home / Self Care | Payer: Medicare Other | Source: Ambulatory Visit | Attending: Neurosurgery | Admitting: Neurosurgery

## 2011-12-06 ENCOUNTER — Other Ambulatory Visit: Payer: Medicare Other

## 2011-12-06 DIAGNOSIS — M25552 Pain in left hip: Secondary | ICD-10-CM

## 2011-12-17 ENCOUNTER — Encounter: Payer: Self-pay | Admitting: Family Medicine

## 2012-01-15 ENCOUNTER — Other Ambulatory Visit: Payer: Self-pay | Admitting: Emergency Medicine

## 2012-01-15 DIAGNOSIS — Z1231 Encounter for screening mammogram for malignant neoplasm of breast: Secondary | ICD-10-CM

## 2012-02-25 ENCOUNTER — Ambulatory Visit
Admission: RE | Admit: 2012-02-25 | Discharge: 2012-02-25 | Disposition: A | Discharge status: Home / Self Care | Payer: Medicare Other | Source: Ambulatory Visit | Attending: Emergency Medicine | Admitting: Emergency Medicine

## 2012-02-25 DIAGNOSIS — Z1231 Encounter for screening mammogram for malignant neoplasm of breast: Secondary | ICD-10-CM

## 2012-04-25 ENCOUNTER — Other Ambulatory Visit: Payer: Self-pay | Admitting: Neurosurgery

## 2012-05-02 ENCOUNTER — Ambulatory Visit
Admission: RE | Admit: 2012-05-02 | Discharge: 2012-05-02 | Disposition: A | Discharge status: Home / Self Care | Payer: Medicare Other | Source: Ambulatory Visit | Attending: Neurosurgery | Admitting: Neurosurgery

## 2012-05-02 ENCOUNTER — Other Ambulatory Visit: Payer: Self-pay | Admitting: Neurosurgery

## 2012-05-02 DIAGNOSIS — M5137 Other intervertebral disc degeneration, lumbosacral region: Secondary | ICD-10-CM

## 2012-05-02 DIAGNOSIS — M51379 Other intervertebral disc degeneration, lumbosacral region without mention of lumbar back pain or lower extremity pain: Secondary | ICD-10-CM

## 2012-05-02 DIAGNOSIS — IMO0002 Reserved for concepts with insufficient information to code with codable children: Secondary | ICD-10-CM

## 2012-05-02 DIAGNOSIS — M5126 Other intervertebral disc displacement, lumbar region: Secondary | ICD-10-CM

## 2012-05-02 DIAGNOSIS — M545 Low back pain, unspecified: Secondary | ICD-10-CM

## 2012-12-06 ENCOUNTER — Ambulatory Visit (INDEPENDENT_AMBULATORY_CARE_PROVIDER_SITE_OTHER): Payer: Medicare Other | Admitting: *Deleted

## 2012-12-06 DIAGNOSIS — Z23 Encounter for immunization: Secondary | ICD-10-CM

## 2013-01-17 ENCOUNTER — Encounter (HOSPITAL_COMMUNITY): Payer: Self-pay | Admitting: Pharmacy Technician

## 2013-01-18 ENCOUNTER — Other Ambulatory Visit (HOSPITAL_COMMUNITY): Payer: Self-pay | Admitting: Orthopaedic Surgery

## 2013-01-19 NOTE — Pre-Procedure Instructions (Signed)
[name]  01/19/2013   Your procedure is scheduled on:  Tuesday, January 24, 2013  Report to [facility] Entrance "A" [address] at [employer] PM.  Call this number if you have problems the morning of surgery: [phone]   Remember:   Do not eat food or drink liquids after midnight.   Take these medicines the morning of surgery with A SIP OF WATER: Hydrocodone-acetaminophen (Norco) if needed  STOP taking Aspirin, Goody's, BC's, Aleve (Naproxen), Ibuprofen (Advil or Motrin), Fish Oil, Vitamins, Herbal Supplements or any substance that could thin your blood starting today, January 20, 2013.   Do not wear jewelry, make-up or nail polish.  Do not wear lotions, powders, or perfumes. You may wear deodorant.  Do not shave 48 hours prior to surgery.   Do not bring valuables to the hospital.  [facility] is not responsible for any belongings or valuables.               Contacts, dentures or bridgework may not be worn into surgery.  Leave suitcase in the car. After surgery it may be brought to your room.  For patients admitted to the hospital, discharge time is determined by your treatment team.                 Special Instructions: Shower using CHG 2 nights before surgery and the night before surgery.  If you shower the day of surgery use CHG.  Use special wash - you have one bottle of CHG for all showers.  You should use approximately 1/3 of the bottle for each shower.   Please read over the following fact sheets that you were given: Pain Booklet, Coughing and Deep Breathing, Blood Transfusion Information, Total Joint Packet, MRSA Information and Surgical Site Infection Prevention

## 2013-01-20 ENCOUNTER — Encounter (HOSPITAL_COMMUNITY)
Admission: RE | Admit: 2013-01-20 | Discharge: 2013-01-20 | Disposition: A | Discharge status: Home / Self Care | Payer: Medicare Other | Source: Ambulatory Visit | Attending: Orthopaedic Surgery | Admitting: Orthopaedic Surgery

## 2013-01-20 ENCOUNTER — Encounter (HOSPITAL_COMMUNITY): Payer: Self-pay

## 2013-01-20 DIAGNOSIS — Z01818 Encounter for other preprocedural examination: Secondary | ICD-10-CM | POA: Insufficient documentation

## 2013-01-20 DIAGNOSIS — Z01812 Encounter for preprocedural laboratory examination: Secondary | ICD-10-CM | POA: Insufficient documentation

## 2013-01-20 LAB — URINALYSIS, ROUTINE W REFLEX MICROSCOPIC
Bilirubin Urine: NEGATIVE
Glucose, UA: NEGATIVE mg/dL
Hgb urine dipstick: NEGATIVE
Ketones, ur: NEGATIVE mg/dL
Nitrite: NEGATIVE
Specific Gravity, Urine: 1.025 (ref 1.005–1.030)
Urobilinogen, UA: 1 mg/dL (ref 0.0–1.0)
pH: 5.5 (ref 5.0–8.0)

## 2013-01-20 LAB — COMPREHENSIVE METABOLIC PANEL
AST: 23 U/L (ref 0–37)
Albumin: 3.9 g/dL (ref 3.5–5.2)
CO2: 23 mEq/L (ref 19–32)
Calcium: 9.6 mg/dL (ref 8.4–10.5)
Creatinine, Ser: 0.57 mg/dL (ref 0.50–1.10)
GFR calc non Af Amer: >90 mL/min (ref >90)
Total Protein: 8.1 g/dL (ref 6.0–8.3)

## 2013-01-20 LAB — TYPE AND SCREEN
ABO/RH(D): O POS
Antibody Screen: NEGATIVE

## 2013-01-20 LAB — CBC
HCT: 38.4 % (ref 36.0–46.0)
RBC: 4.2 MIL/uL (ref 3.87–5.11)
RDW: 13.2 % (ref 11.5–15.5)
WBC: 7.3 10*3/uL (ref 4.0–10.5)

## 2013-01-20 LAB — APTT: aPTT: 39 seconds — ABNORMAL HIGH (ref 24–37)

## 2013-01-20 LAB — PROTIME-INR: INR: 0.99 (ref 0.00–1.49)

## 2013-01-20 LAB — SURGICAL PCR SCREEN: Staphylococcus aureus: NEGATIVE

## 2013-01-20 NOTE — Progress Notes (Signed)
  Pt denies having a Cardiologist.  PCP is Dr. Collene Gobble.   Pt reports having EKG and CXR done but was years ago.  Pt denies heart cath, stress test or ECHO.

## 2013-01-23 MED ORDER — CEFAZOLIN SODIUM-DEXTROSE 2-3 GM-% IV SOLR
2.0000 g | INTRAVENOUS | Status: AC
  Administered 2013-01-24: 2 g via INTRAVENOUS
  Filled 2013-01-23: qty 50

## 2013-01-24 ENCOUNTER — Inpatient Hospital Stay (HOSPITAL_COMMUNITY): Payer: Medicare Other

## 2013-01-24 ENCOUNTER — Inpatient Hospital Stay (HOSPITAL_COMMUNITY): Payer: Medicare Other | Admitting: Anesthesiology

## 2013-01-24 ENCOUNTER — Encounter (HOSPITAL_COMMUNITY): Payer: Self-pay | Admitting: *Deleted

## 2013-01-24 ENCOUNTER — Inpatient Hospital Stay (HOSPITAL_COMMUNITY)
Admission: RE | Admit: 2013-01-24 | Discharge: 2013-01-28 | DRG: 470 | Disposition: A | Discharge status: Home / Self Care | Payer: Medicare Other | Source: Ambulatory Visit | Attending: Orthopaedic Surgery | Admitting: Orthopaedic Surgery

## 2013-01-24 ENCOUNTER — Encounter (HOSPITAL_COMMUNITY): Payer: Medicare Other | Admitting: Anesthesiology

## 2013-01-24 ENCOUNTER — Encounter (HOSPITAL_COMMUNITY)
Admission: RE | Disposition: A | Discharge status: Home / Self Care | Payer: Self-pay | Source: Ambulatory Visit | Attending: Orthopaedic Surgery

## 2013-01-24 DIAGNOSIS — B1920 Unspecified viral hepatitis C without hepatic coma: Secondary | ICD-10-CM | POA: Diagnosis present

## 2013-01-24 DIAGNOSIS — F17200 Nicotine dependence, unspecified, uncomplicated: Secondary | ICD-10-CM | POA: Diagnosis present

## 2013-01-24 DIAGNOSIS — M1610 Unilateral primary osteoarthritis, unspecified hip: Principal | ICD-10-CM | POA: Diagnosis present

## 2013-01-24 DIAGNOSIS — M87059 Idiopathic aseptic necrosis of unspecified femur: Secondary | ICD-10-CM | POA: Diagnosis present

## 2013-01-24 DIAGNOSIS — Z96649 Presence of unspecified artificial hip joint: Secondary | ICD-10-CM

## 2013-01-24 DIAGNOSIS — M25459 Effusion, unspecified hip: Secondary | ICD-10-CM | POA: Diagnosis present

## 2013-01-24 DIAGNOSIS — M169 Osteoarthritis of hip, unspecified: Secondary | ICD-10-CM

## 2013-01-24 HISTORY — PX: JOINT REPLACEMENT: SHX530

## 2013-01-24 HISTORY — PX: TOTAL HIP ARTHROPLASTY: SHX124

## 2013-01-24 SURGERY — ARTHROPLASTY, HIP, TOTAL, ANTERIOR APPROACH
Anesthesia: General | Site: Hip | Laterality: Left

## 2013-01-24 MED ORDER — ALUM & MAG HYDROXIDE-SIMETH 200-200-20 MG/5ML PO SUSP
30.0000 mL | ORAL | Status: DC | PRN
  Administered 2013-01-26 – 2013-01-27 (×2): 30 mL via ORAL
  Filled 2013-01-24 (×2): qty 30

## 2013-01-24 MED ORDER — PROPOFOL 10 MG/ML IV BOLUS
INTRAVENOUS | Status: DC | PRN
  Administered 2013-01-24: 150 mg via INTRAVENOUS

## 2013-01-24 MED ORDER — HYDROMORPHONE HCL PF 1 MG/ML IJ SOLN
1.0000 mg | Freq: Once | INTRAMUSCULAR | Status: AC
  Filled 2013-01-24: qty 1

## 2013-01-24 MED ORDER — ROCURONIUM BROMIDE 100 MG/10ML IV SOLN
INTRAVENOUS | Status: DC | PRN
  Administered 2013-01-24: 40 mg via INTRAVENOUS

## 2013-01-24 MED ORDER — OXYCODONE HCL 5 MG PO TABS: 5.0000 mg | ORAL_TABLET | Freq: Once | ORAL | Status: DC | PRN

## 2013-01-24 MED ORDER — CEFAZOLIN SODIUM 1-5 GM-% IV SOLN
1.0000 g | Freq: Four times a day (QID) | INTRAVENOUS | Status: AC
  Administered 2013-01-24 – 2013-01-25 (×2): 1 g via INTRAVENOUS
  Filled 2013-01-24 (×3): qty 50

## 2013-01-24 MED ORDER — METHOCARBAMOL 500 MG PO TABS
500.0000 mg | ORAL_TABLET | Freq: Four times a day (QID) | ORAL | Status: DC | PRN
  Administered 2013-01-24 – 2013-01-28 (×7): 500 mg via ORAL
  Filled 2013-01-24 (×7): qty 1

## 2013-01-24 MED ORDER — SODIUM CHLORIDE 0.9 % IV SOLN
INTRAVENOUS | Status: DC
  Administered 2013-01-24 – 2013-01-26 (×3): via INTRAVENOUS

## 2013-01-24 MED ORDER — OXYCODONE HCL 5 MG PO TABS
10.0000 mg | ORAL_TABLET | Freq: Once | ORAL | Status: DC
  Filled 2013-01-24: qty 2

## 2013-01-24 MED ORDER — HYDROMORPHONE HCL PF 1 MG/ML IJ SOLN
1.0000 mg | INTRAMUSCULAR | Status: DC | PRN
  Administered 2013-01-24 – 2013-01-27 (×7): 1 mg via INTRAVENOUS
  Filled 2013-01-24 (×8): qty 1

## 2013-01-24 MED ORDER — 0.9 % SODIUM CHLORIDE (POUR BTL) OPTIME
TOPICAL | Status: DC | PRN
  Administered 2013-01-24: 1000 mL

## 2013-01-24 MED ORDER — ONDANSETRON HCL 4 MG/2ML IJ SOLN: 4.0000 mg | Freq: Four times a day (QID) | INTRAMUSCULAR | Status: DC | PRN

## 2013-01-24 MED ORDER — ARTIFICIAL TEARS OP OINT
TOPICAL_OINTMENT | OPHTHALMIC | Status: DC | PRN
  Administered 2013-01-24: 1 via OPHTHALMIC

## 2013-01-24 MED ORDER — OXYCODONE HCL ER 20 MG PO T12A
20.0000 mg | EXTENDED_RELEASE_TABLET | Freq: Two times a day (BID) | ORAL | Status: DC
  Administered 2013-01-24 – 2013-01-28 (×8): 20 mg via ORAL
  Filled 2013-01-24 (×8): qty 2

## 2013-01-24 MED ORDER — LACTATED RINGERS IV SOLN
INTRAVENOUS | Status: DC
  Administered 2013-01-24 (×2): via INTRAVENOUS

## 2013-01-24 MED ORDER — POLYETHYLENE GLYCOL 3350 17 G PO PACK
17.0000 g | PACK | Freq: Every day | ORAL | Status: DC | PRN
  Administered 2013-01-26 – 2013-01-28 (×3): 17 g via ORAL
  Filled 2013-01-24 (×4): qty 1

## 2013-01-24 MED ORDER — ACETAMINOPHEN 325 MG PO TABS: 650.0000 mg | ORAL_TABLET | Freq: Four times a day (QID) | ORAL | Status: DC | PRN

## 2013-01-24 MED ORDER — ZOLPIDEM TARTRATE 5 MG PO TABS: 5.0000 mg | ORAL_TABLET | Freq: Every evening | ORAL | Status: DC | PRN

## 2013-01-24 MED ORDER — MIDAZOLAM HCL 5 MG/5ML IJ SOLN
INTRAMUSCULAR | Status: DC | PRN
  Administered 2013-01-24: 1 mg via INTRAVENOUS

## 2013-01-24 MED ORDER — PHENOL 1.4 % MT LIQD: 1.0000 | OROMUCOSAL | Status: DC | PRN

## 2013-01-24 MED ORDER — ONDANSETRON HCL 4 MG PO TABS: 4.0000 mg | ORAL_TABLET | Freq: Four times a day (QID) | ORAL | Status: DC | PRN

## 2013-01-24 MED ORDER — OXYCODONE HCL 5 MG/5ML PO SOLN: 5.0000 mg | Freq: Once | ORAL | Status: DC | PRN

## 2013-01-24 MED ORDER — PROMETHAZINE HCL 25 MG/ML IJ SOLN
INTRAMUSCULAR | Status: AC
  Filled 2013-01-24: qty 1

## 2013-01-24 MED ORDER — TRANEXAMIC ACID 100 MG/ML IV SOLN
1000.0000 mg | INTRAVENOUS | Status: AC
  Administered 2013-01-24: 1000 mg via INTRAVENOUS
  Filled 2013-01-24: qty 10

## 2013-01-24 MED ORDER — OXYCODONE HCL 5 MG PO TABS
5.0000 mg | ORAL_TABLET | ORAL | Status: DC | PRN
  Administered 2013-01-24 – 2013-01-27 (×15): 10 mg via ORAL
  Administered 2013-01-27: 5 mg via ORAL
  Administered 2013-01-28: 10 mg via ORAL
  Administered 2013-01-28: 5 mg via ORAL
  Administered 2013-01-28: 10 mg via ORAL
  Filled 2013-01-24 (×21): qty 2

## 2013-01-24 MED ORDER — SODIUM CHLORIDE 0.9 % IR SOLN
Status: DC | PRN
  Administered 2013-01-24: 3000 mL

## 2013-01-24 MED ORDER — ASPIRIN EC 325 MG PO TBEC
325.0000 mg | DELAYED_RELEASE_TABLET | Freq: Two times a day (BID) | ORAL | Status: DC
  Administered 2013-01-25 – 2013-01-28 (×7): 325 mg via ORAL
  Filled 2013-01-24 (×9): qty 1

## 2013-01-24 MED ORDER — ACETAMINOPHEN 650 MG RE SUPP: 650.0000 mg | Freq: Four times a day (QID) | RECTAL | Status: DC | PRN

## 2013-01-24 MED ORDER — DOCUSATE SODIUM 100 MG PO CAPS
100.0000 mg | ORAL_CAPSULE | Freq: Two times a day (BID) | ORAL | Status: DC
  Administered 2013-01-24 – 2013-01-28 (×8): 100 mg via ORAL
  Filled 2013-01-24 (×12): qty 1

## 2013-01-24 MED ORDER — METOCLOPRAMIDE HCL 10 MG PO TABS: 5.0000 mg | ORAL_TABLET | Freq: Three times a day (TID) | ORAL | Status: DC | PRN

## 2013-01-24 MED ORDER — HYDROMORPHONE HCL PF 1 MG/ML IJ SOLN
0.2500 mg | INTRAMUSCULAR | Status: DC | PRN
  Administered 2013-01-24 (×4): 0.5 mg via INTRAVENOUS

## 2013-01-24 MED ORDER — ONDANSETRON HCL 4 MG/2ML IJ SOLN
INTRAMUSCULAR | Status: DC | PRN
  Administered 2013-01-24: 4 mg via INTRAVENOUS

## 2013-01-24 MED ORDER — METOCLOPRAMIDE HCL 5 MG/ML IJ SOLN: 5.0000 mg | Freq: Three times a day (TID) | INTRAMUSCULAR | Status: DC | PRN

## 2013-01-24 MED ORDER — FENTANYL CITRATE 0.05 MG/ML IJ SOLN
INTRAMUSCULAR | Status: DC | PRN
  Administered 2013-01-24: 50 ug via INTRAVENOUS
  Administered 2013-01-24: 100 ug via INTRAVENOUS
  Administered 2013-01-24: 50 ug via INTRAVENOUS
  Administered 2013-01-24: 100 ug via INTRAVENOUS
  Administered 2013-01-24 (×2): 50 ug via INTRAVENOUS
  Administered 2013-01-24: 100 ug via INTRAVENOUS

## 2013-01-24 MED ORDER — METHOCARBAMOL 100 MG/ML IJ SOLN
500.0000 mg | Freq: Four times a day (QID) | INTRAVENOUS | Status: DC | PRN
  Filled 2013-01-24: qty 5

## 2013-01-24 MED ORDER — MEPERIDINE HCL 25 MG/ML IJ SOLN: 6.2500 mg | INTRAMUSCULAR | Status: DC | PRN

## 2013-01-24 MED ORDER — MENTHOL 3 MG MT LOZG: 1.0000 | LOZENGE | OROMUCOSAL | Status: DC | PRN

## 2013-01-24 MED ORDER — LIDOCAINE HCL (CARDIAC) 20 MG/ML IV SOLN
INTRAVENOUS | Status: DC | PRN
  Administered 2013-01-24: 100 mg via INTRAVENOUS

## 2013-01-24 MED ORDER — PROMETHAZINE HCL 25 MG/ML IJ SOLN: 6.2500 mg | INTRAMUSCULAR | Status: DC | PRN

## 2013-01-24 MED ORDER — DIPHENHYDRAMINE HCL 12.5 MG/5ML PO ELIX: 12.5000 mg | ORAL_SOLUTION | ORAL | Status: DC | PRN

## 2013-01-24 MED ORDER — HYDROMORPHONE HCL PF 1 MG/ML IJ SOLN
INTRAMUSCULAR | Status: AC
  Administered 2013-01-24: 0.5 mg via INTRAVENOUS
  Filled 2013-01-24: qty 1

## 2013-01-24 SURGICAL SUPPLY — 60 items
ADH SKN CLS LQ APL DERMABOND (GAUZE/BANDAGES/DRESSINGS)
BANDAGE GAUZE ELAST BULKY 4 IN (GAUZE/BANDAGES/DRESSINGS) IMPLANT
BLADE SAW SGTL 18X1.27X75 (BLADE) ×2 IMPLANT
BLADE SURG ROTATE 9660 (MISCELLANEOUS) IMPLANT
CAPT HIP PF COP ×1 IMPLANT
CELLS DAT CNTRL 66122 CELL SVR (MISCELLANEOUS) ×1 IMPLANT
CLOTH BEACON ORANGE TIMEOUT ST (SAFETY) ×2 IMPLANT
COVER BACK TABLE 24X17X13 BIG (DRAPES) IMPLANT
COVER SURGICAL LIGHT HANDLE (MISCELLANEOUS) ×2 IMPLANT
CUP SECTOR GRIPTON 50MM (Cup) ×1 IMPLANT
DERMABOND ADHESIVE PROPEN (GAUZE/BANDAGES/DRESSINGS)
DERMABOND ADVANCED .7 DNX6 (GAUZE/BANDAGES/DRESSINGS) ×1 IMPLANT
DRAPE C-ARM 42X72 X-RAY (DRAPES) ×2 IMPLANT
DRAPE STERI IOBAN 125X83 (DRAPES) ×2 IMPLANT
DRAPE U-SHAPE 47X51 STRL (DRAPES) ×6 IMPLANT
DRSG AQUACEL AG ADV 3.5X10 (GAUZE/BANDAGES/DRESSINGS) ×2 IMPLANT
DURAPREP 26ML APPLICATOR (WOUND CARE) ×2 IMPLANT
ELECT BLADE 4.0 EZ CLEAN MEGAD (MISCELLANEOUS) ×2
ELECT BLADE 6.5 EXT (BLADE) IMPLANT
ELECT BLADE TIP CTD 4 INCH (ELECTRODE) ×1 IMPLANT
ELECT CAUTERY BLADE 6.4 (BLADE) ×2 IMPLANT
ELECT REM PT RETURN 9FT ADLT (ELECTROSURGICAL) ×2
ELECTRODE BLDE 4.0 EZ CLN MEGD (MISCELLANEOUS) IMPLANT
ELECTRODE REM PT RTRN 9FT ADLT (ELECTROSURGICAL) ×1 IMPLANT
FACESHIELD LNG OPTICON STERILE (SAFETY) ×5 IMPLANT
GAUZE XEROFORM 1X8 LF (GAUZE/BANDAGES/DRESSINGS) ×1 IMPLANT
GLOVE BIOGEL PI IND STRL 8 (GLOVE) ×2 IMPLANT
GLOVE BIOGEL PI INDICATOR 8 (GLOVE) ×2
GLOVE ECLIPSE 8.0 STRL XLNG CF (GLOVE) ×2 IMPLANT
GLOVE ORTHO TXT STRL SZ7.5 (GLOVE) ×4 IMPLANT
GLOVE SURG ORTHO 8.0 STRL STRW (GLOVE) ×2 IMPLANT
GOWN STRL REIN XL XLG (GOWN DISPOSABLE) ×4 IMPLANT
HANDPIECE INTERPULSE COAX TIP (DISPOSABLE) ×2
HEAD FEMORAL 32 CERAMIC (Hips) ×1 IMPLANT
KIT BASIN OR (CUSTOM PROCEDURE TRAY) ×2 IMPLANT
KIT ROOM TURNOVER OR (KITS) ×2 IMPLANT
LINER ACET PNNCL PLUS4 NEUTRAL (Hips) IMPLANT
MANIFOLD NEPTUNE II (INSTRUMENTS) ×2 IMPLANT
NS IRRIG 1000ML POUR BTL (IV SOLUTION) ×2 IMPLANT
PACK TOTAL JOINT (CUSTOM PROCEDURE TRAY) ×2 IMPLANT
PAD ARMBOARD 7.5X6 YLW CONV (MISCELLANEOUS) ×4 IMPLANT
PINNACLE PLUS 4 NEUTRAL (Hips) ×2 IMPLANT
RETRACTOR WND ALEXIS 18 MED (MISCELLANEOUS) ×1 IMPLANT
RTRCTR WOUND ALEXIS 18CM MED (MISCELLANEOUS) ×2
SET HNDPC FAN SPRY TIP SCT (DISPOSABLE) ×1 IMPLANT
SPONGE LAP 18X18 X RAY DECT (DISPOSABLE) IMPLANT
SPONGE LAP 4X18 X RAY DECT (DISPOSABLE) IMPLANT
STAPLER VISISTAT 35W (STAPLE) ×2 IMPLANT
SUT ETHIBOND NAB CT1 #1 30IN (SUTURE) ×5 IMPLANT
SUT MNCRL AB 3-0 PS2 27 (SUTURE) ×2 IMPLANT
SUT VIC AB 0 CT1 27 (SUTURE) ×4
SUT VIC AB 0 CT1 27XBRD ANBCTR (SUTURE) ×2 IMPLANT
SUT VIC AB 1 CT1 27 (SUTURE) ×4
SUT VIC AB 1 CT1 27XBRD ANBCTR (SUTURE) ×2 IMPLANT
SUT VIC AB 2-0 CT1 27 (SUTURE) ×4
SUT VIC AB 2-0 CT1 TAPERPNT 27 (SUTURE) ×2 IMPLANT
TOWEL OR 17X24 6PK STRL BLUE (TOWEL DISPOSABLE) ×2 IMPLANT
TOWEL OR 17X26 10 PK STRL BLUE (TOWEL DISPOSABLE) ×2 IMPLANT
TRAY FOLEY CATH 16FRSI W/METER (SET/KITS/TRAYS/PACK) ×1 IMPLANT
WATER STERILE IRR 1000ML POUR (IV SOLUTION) ×4 IMPLANT

## 2013-01-24 NOTE — Preoperative (Signed)
Beta Blockers   Reason not to administer Beta Blockers:Not Applicable 

## 2013-01-24 NOTE — Anesthesia Postprocedure Evaluation (Signed)
Anesthesia Post Note  Patient: [name]  Procedure(s) Performed: Procedure(s) (LRB): LEFT TOTAL HIP ARTHROPLASTY ANTERIOR APPROACH (Left)  Anesthesia type: General  Patient location: PACU  Post pain: Pain level controlled  Post assessment: Patient's Cardiovascular Status Stable  Last Vitals:  Filed Vitals:   01/24/13 1815  BP: 159/80  Pulse: 64  Temp:   Resp: 16    Post vital signs: Reviewed and stable  Level of consciousness: alert  Complications: No apparent anesthesia complications

## 2013-01-24 NOTE — Brief Op Note (Signed)
01/24/2013  4:58 PM  PATIENT:  [name]  62 y.o. female  PRE-OPERATIVE DIAGNOSIS:  left hip avascular necrosis  POST-OPERATIVE DIAGNOSIS:  left hip avascular necrosis  PROCEDURE:  Procedure(s): LEFT TOTAL HIP ARTHROPLASTY ANTERIOR APPROACH (Left)  SURGEON:  Surgeon(s) and Role:    * Kathryne Hitch, MD - Primary  PHYSICIAN ASSISTANT: Rexene Edison, PA-C  ANESTHESIA:   general  EBL:  Total I/O In: 1000 [I.V.:1000] Out: 350 [Urine:150; Blood:200]  BLOOD ADMINISTERED:none  DRAINS: none   LOCAL MEDICATIONS USED:  NONE  SPECIMEN:  No Specimen  DISPOSITION OF SPECIMEN:  N/A  COUNTS:  YES  TOURNIQUET:  * No tourniquets in log *  DICTATION: .Other Dictation: Dictation Number [id number]  PLAN OF CARE: Admit to inpatient   PATIENT DISPOSITION:  PACU - hemodynamically stable.   Delay start of Pharmacological VTE agent (>24hrs) due to surgical blood loss or risk of bleeding: no

## 2013-01-24 NOTE — Anesthesia Preprocedure Evaluation (Addendum)
Anesthesia Evaluation  Patient identified by MRN, date of birth, ID band Patient awake    Reviewed: Allergy & Precautions, H&P , NPO status , Patient's Chart, lab work & pertinent test results  Airway Mallampati: II TM Distance: >3 FB Neck ROM: Full    Dental  (+) Dental Advisory Given   Pulmonary Current Smoker,  breath sounds clear to auscultation        Cardiovascular negative cardio ROS  Rhythm:Regular Rate:Normal     Neuro/Psych negative neurological ROS  negative psych ROS   GI/Hepatic negative GI ROS, (+) Hepatitis -, C  Endo/Other  negative endocrine ROS  Renal/GU negative Renal ROS     Musculoskeletal negative musculoskeletal ROS (+)   Abdominal   Peds  Hematology negative hematology ROS (+)   Anesthesia Other Findings   Reproductive/Obstetrics                          Anesthesia Physical  Anesthesia Plan  ASA: III  Anesthesia Plan: General   Post-op Pain Management:    Induction: Intravenous  Airway Management Planned: Oral ETT  Additional Equipment:   Intra-op Plan:   Post-operative Plan: Extubation in OR  Informed Consent: I have reviewed the patients History and Physical, chart, labs and discussed the procedure including the risks, benefits and alternatives for the proposed anesthesia with the patient or authorized representative who has indicated his/her understanding and acceptance.   Dental advisory given  Plan Discussed with: CRNA  Anesthesia Plan Comments:        Anesthesia Quick Evaluation

## 2013-01-24 NOTE — H&P (Signed)
TOTAL HIP ADMISSION H&P  Patient is admitted for left total hip arthroplasty.  Subjective:  Chief Complaint: left hip pain  HPI: [name], 62 y.o. female, has a history of pain and functional disability in the left hip(s) due to arthritis and patient has failed non-surgical conservative treatments for greater than 12 weeks to include NSAID's and/or analgesics and activity modification.  Onset of symptoms was gradual starting 2 years ago with gradually worsening course since that time.The patient noted no past surgery on the left hip(s).  Patient currently rates pain in the left hip at 8 out of 10 with activity. Patient has night pain, worsening of pain with activity and weight bearing, trendelenberg gait, pain that interfers with activities of daily living and pain with passive range of motion. Patient has evidence of subchondral sclerosis, periarticular osteophytes and joint space narrowing by imaging studies. This condition presents safety issues increasing the risk of falls.  There is no current active infection.  Patient Active Problem List   Diagnosis Date Noted  . Degenerative arthritis of left hip 01/24/2013   Past Medical History  Diagnosis Date  . Hepatitis     "c"  . Hx of bronchitis     Past Surgical History  Procedure Laterality Date  . Abdominal hysterectomy    . Cholecystectomy    . Back surgery      x5  . Colonoscopy      No prescriptions prior to admission   No Known Allergies  History  Substance Use Topics  . Smoking status: Current Every Day Smoker -- 1.00 packs/day for 25 years    Types: Cigarettes  . Smokeless tobacco: Never Used  . Alcohol Use: No    No family history on file.   Review of Systems  Musculoskeletal: Positive for joint pain.  All other systems reviewed and are negative.    Objective:  Physical Exam  Constitutional: She is oriented to person, place, and time. She appears well-developed and well-nourished.  HENT:  Head:  Normocephalic and atraumatic.  Eyes: EOM are normal. Pupils are equal, round, and reactive to light.  Neck: Normal range of motion. Neck supple.  Cardiovascular: Normal rate and regular rhythm.   Respiratory: Effort normal and breath sounds normal.  GI: Soft. Bowel sounds are normal.  Musculoskeletal:       Left hip: She exhibits decreased range of motion, decreased strength and bony tenderness.  Neurological: She is alert and oriented to person, place, and time.  Skin: Skin is warm and dry.  Psychiatric: She has a normal mood and affect.    Vital signs in last 24 hours:    Labs:   Estimated body mass index is 26.63 kg/(m^2) as calculated from the following:   Height as of 05/07/11: 5\' 6"  (1.676 m).   Weight as of 04/29/11: 74.8 kg (164 lb 14.5 oz).   Imaging Review Plain radiographs demonstrate severe degenerative joint disease of the left hip(s). The bone quality appears to be good for age and reported activity level.  Assessment/Plan:  End stage arthritis, left hip(s)  The patient history, physical examination, clinical judgement of the provider and imaging studies are consistent with end stage degenerative joint disease of the left hip(s) and total hip arthroplasty is deemed medically necessary. The treatment options including medical management, injection therapy, arthroscopy and arthroplasty were discussed at length. The risks and benefits of total hip arthroplasty were presented and reviewed. The risks due to aseptic loosening, infection, stiffness, dislocation/subluxation,  thromboembolic complications  and other imponderables were discussed.  The patient acknowledged the explanation, agreed to proceed with the plan and consent was signed. Patient is being admitted for inpatient treatment for surgery, pain control, PT, OT, prophylactic antibiotics, VTE prophylaxis, progressive ambulation and ADL's and discharge planning.The patient is planning to be discharged home with home  health services

## 2013-01-24 NOTE — Anesthesia Procedure Notes (Signed)
Procedure Name: Intubation Date/Time: 01/24/2013 2:38 PM Performed by: Jefm Miles E Pre-anesthesia Checklist: Patient identified, Emergency Drugs available, Suction available, Patient being monitored and Timeout performed Patient Re-evaluated:Patient Re-evaluated prior to inductionOxygen Delivery Method: Circle system utilized Preoxygenation: Pre-oxygenation with 100% oxygen Intubation Type: IV induction Ventilation: Mask ventilation without difficulty Laryngoscope Size: Mac and 3 Grade View: Grade I Tube type: Oral Tube size: 7.5 mm Number of attempts: 1 Airway Equipment and Method: Stylet Placement Confirmation: ETT inserted through vocal cords under direct vision,  positive ETCO2 and breath sounds checked- equal and bilateral Secured at: 21 cm Tube secured with: Tape Dental Injury: Teeth and Oropharynx as per pre-operative assessment

## 2013-01-24 NOTE — Progress Notes (Signed)
Post-op VS on arrival to 5n 

## 2013-01-24 NOTE — Transfer of Care (Signed)
Immediate Anesthesia Transfer of Care Note  Patient: [name]  Procedure(s) Performed: Procedure(s): LEFT TOTAL HIP ARTHROPLASTY ANTERIOR APPROACH (Left)  Patient Location: PACU  Anesthesia Type:General  Level of Consciousness: awake, alert  and oriented  Airway & Oxygen Therapy: Patient Spontanous Breathing and Patient connected to nasal cannula oxygen  Post-op Assessment: Report given to PACU RN and Patient moving all extremities X 4  Post vital signs: Reviewed and stable  Complications: No apparent anesthesia complications

## 2013-01-25 ENCOUNTER — Encounter (HOSPITAL_COMMUNITY): Payer: Self-pay | Admitting: Emergency Medicine

## 2013-01-25 LAB — CBC
HCT: 32.9 % — ABNORMAL LOW (ref 36.0–46.0)
Hemoglobin: 11.4 g/dL — ABNORMAL LOW (ref 12.0–15.0)
MCH: 31.7 pg (ref 26.0–34.0)
MCHC: 34.7 g/dL (ref 30.0–36.0)
MCV: 91.4 fL (ref 78.0–100.0)
Platelets: 207 10*3/uL (ref 150–400)
RBC: 3.6 MIL/uL — ABNORMAL LOW (ref 3.87–5.11)

## 2013-01-25 LAB — BASIC METABOLIC PANEL
BUN: 12 mg/dL (ref 6–23)
CO2: 24 mEq/L (ref 19–32)
Calcium: 8.4 mg/dL (ref 8.4–10.5)
Creatinine, Ser: 0.57 mg/dL (ref 0.50–1.10)
GFR calc non Af Amer: >90 mL/min (ref >90)
Sodium: 136 mEq/L (ref 135–145)

## 2013-01-25 NOTE — Progress Notes (Signed)
Utilization review completed.  

## 2013-01-25 NOTE — Op Note (Signed)
[name], [name]               ACCOUNT NO.:  [id number]  MEDICAL RECORD NO.:  [id number]  LOCATION:  [id number]                        FACILITY:  [facility]  PHYSICIAN:  Vanita Panda. Magnus Ivan, M.D.DATE OF BIRTH:  08-Feb-1951  DATE OF PROCEDURE:  01/24/2013 DATE OF DISCHARGE:                              OPERATIVE REPORT   PREOPERATIVE DIAGNOSES:  Degenerative joint disease and osteoarthritis, left hip.  POSTOPERATIVE DIAGNOSES:  Degenerative joint disease and osteoarthritis, left hip.  PROCEDURE:  Left total hip arthroplasty the direct anterior approach.  IMPLANTS:  DePuy Sector Gription acetabular component size 52 with apex hole eliminator guide 2 and 2 screws, size 36+ 4 neutral polyethylene liner, size 13 Corail femoral component with varus offset (KLA, 36+ 1.5 ceramic hip ball.)  SURGEON:  Vanita Panda. Magnus Ivan, M.D.  ASSISTING:  Richardean Canal, PA-C.  ANESTHESIA:  General.  ANTIBIOTICS:  2 g IV Ancef.  BLOOD LOSS:  Less than 500 mL.  COMPLICATIONS:  None.  INDICATIONS:  [name] is a 62 year old female with severe end-stage arthritis that is involving her left hip and has become very painful to her.  She has failed conservative treatment.  At this point, wished to proceed with a total hip arthroplasty.  She understands the risks of acute blood loss anemia, infection, DVT and PE, as well as fracture. She understands the goals are decreased pain, improved mobility, and overall improved quality of life.  She understands will be as careful as possible on this, but she does have hepatitis C as well.  PROCEDURE DESCRIPTION:  After informed consent was obtained, appropriate left hip was marked, she was brought to the operating room and general anesthesia was obtained while she was on her stretcher.  Foley catheter was placed and both traction boots were placed upon both of her feet. She was next placed supine on the Hana fracture table with the perineal post in place  and both legs in inline skeletal traction, but no traction applied.  Her left operative hip was prepped and draped with DuraPrep and sterile drapes.  A time-out was called to identify correct patient, correct left hip.  I then made an incision just inferior and posterior to the anterior-superior iliac spine and carried this obliquely down the leg.  I dissected down easily to the tensor fascia lata muscle and tensor fascia was quite tight, but I divided it longitudinally and proceeded with a direct anterior approach to the hip.  We cauterized the lateral femoral circumflex vessels and I put a cobra retractors around the medial and lateral neck.  I cauterized the lateral femoral circumflex vessels again and then made my arthrotomy.  I opened up the hip capsule in a L-type format placing the hip retractors in the hip capsule, we did find a large hip joint effusion.  We then made our femoral neck cut with an oscillating saw just proximal to the lesser trochanter and completed this with an osteotome.  We placed a corkscrew guide in the femoral head and removed the femoral head in its entirety and found it to have areas of devoid of cartilage.  We then placed a Bent Hohmann in the medial acetabular and Cobra retractor laterally.  We  tried to clean the acetabulum as most we could of debris and had pretty good visualization of this.  We then began reaming from a size 42 reamer in 2 mm increments up to a size 48.  With all reamers placed under direct visualization and last reamer under direct fluoroscopy, I was pleased with the size 48, so we placed the real 48 DePuy Sector Gription acetabular component and apex hole eliminator guide.  I tried to move the polyethylene, and I felt like it was solid, so I placed a 32+ 4 neutral polyethylene liner, which was somewhat difficult to place.  The attention was then turned to the femur with the leg externally rotated to 100 degrees, extended and adducted.   We were able to gain access to the femoral canal.  After placing a Mueller retractor medially and a Hohmann retractor behind the greater trochanter and releasing the lateral joint capsule.  We then used a box cutting osteotome and then a femoral canal and a rongeur to lateralize.  We then began broaching from a size 8 Corail broaching system up to a size 13 and 13 was felt to be stable and tight, so we trialed a high offset or varus offset neck and a 32+ 1 femoral head.  We reduced this in the acetabulum and it was felt to be stable and everything looked good, so we dislocated the hip.  Her leg lengths were measured equal and it was stable, so we take out the trial stem and placed the real size 13 Corail femoral component with a 32+ 1 ceramic hip ball.  We brought the leg back over and up with traction and internal rotation reduced it in the pelvis, but then I noticed under fluoroscopy that the positioning of my femoral component had changed so obviously it was loose, so we dislocated the hip and I was able to remove the ceramic hip ball and take the stem out surprisingly easy and then I moved the acetabular component.  I then re- reamed and we went up to a size 52.  We went up to a size 52 acetabular component.  We then were able to place 2 screws.  We had a good tight fit with this and placed the real 36+ 4 neutral polyethylene liner.  We then brought the leg back down and over and were able to place the 13 stem back handing can and then placed a real 36+ 1.5 ceramic hip ball, and brought this into the acetabulum and again it was stable and I was pleased with the position of all the implants.  We then copiously irrigated the soft tissue with normal saline solution, were able to close the joint capsule with interrupted #1 Ethibond suture, followed by a running #1 Vicryl in the tensor fascia, 0 Vicryl in the deep tissue, 2- 0 Vicryl in subcutaneous tissue, 4-0 Monocryl subcuticular stitch  and Steri-Strips on the skin.  An Aquacel dressing was applied.  She was then taken off the Hana table, awakened, extubated, and taken to recovery room in stable condition.  All final counts were correct. There were no complications, otherwise noted.  Of note, Richardean Canal, PA-C assisted throughout the entire case and assisted was crucial for getting the case done.     Vanita Panda. Magnus Ivan, M.D.     CYB/MEDQ  D:  01/24/2013  T:  01/25/2013  Job:  [id number]

## 2013-01-25 NOTE — Evaluation (Signed)
Occupational Therapy Evaluation Patient Details Name: [name] MRN: [id number] DOB: 09-06-1950 Today's Date: 01/25/2013 Time: 4098-1191 OT Time Calculation (min): 27 min  OT Assessment / Plan / Recommendation History of present illness LEFT TOTAL HIP ARTHROPLASTY ANTERIOR APPROACH. Per daughter and pt, pt was only minimally ambulatory pta due to back issues and hip pain and used a rollator.   Clinical Impression   This 62 yo female admitted and underwent above presents to acute OT with increased pain in LLE, decreased AROM LLE, pre-exsisting back issues, and minimal ambulation pta--will benefit from acute OT with follow up HHOT.    OT Assessment  Patient needs continued OT Services    Follow Up Recommendations  Home health OT       Equipment Recommendations  None recommended by OT       Frequency  Min 2X/week    Precautions / Restrictions Precautions Precautions: None Restrictions Weight Bearing Restrictions: No LLE Weight Bearing: Weight bearing as tolerated   Pertinent Vitals/Pain 7/10 in LLE; re-positioned and pre-medicated    ADL  Eating/Feeding: Set up Where Assessed - Eating/Feeding: Chair Grooming: Set up Where Assessed - Grooming: Supported sitting Upper Body Bathing: Set up Where Assessed - Upper Body Bathing: Supported sitting Lower Body Bathing: Maximal assistance Where Assessed - Lower Body Bathing: Unsupported sit to stand Upper Body Dressing: Minimal assistance Where Assessed - Upper Body Dressing: Supported sitting Lower Body Dressing: +1 Total assistance Where Assessed - Lower Body Dressing: Supported sit to Pharmacist, hospital: Minimal assistance Statistician Method: Sit to Barista:  (Bed (raised)>8 steps foreward>sit in recliner behind her) Therapist, nutritional and Hygiene: +1 Total assistance Where Assessed - Glass blower/designer Manipulation and Hygiene: Standing Equipment Used: Gait belt;Rolling  walker Transfers/Ambulation Related to ADLs: Min A sit>stand from bed; min guard A stand>sit recliner; min A ambulation with RW  (all activities requiring increased time)    OT Diagnosis: Generalized weakness;Acute pain  OT Problem List: Decreased strength;Decreased range of motion;Decreased activity tolerance;Impaired balance (sitting and/or standing);Pain;Decreased knowledge of use of DME or AE OT Treatment Interventions: Self-care/ADL training;DME and/or AE instruction;Balance training;Patient/family education   OT Goals(Current goals can be found in the care plan section) Acute Rehab OT Goals Patient Stated Goal: To go home OT Goal Formulation: With patient Time For Goal Achievement: 02/01/13 Potential to Achieve Goals: Good  Visit Information  Last OT Received On: 01/25/13 Assistance Needed: +2 PT/OT/SLP Co-Evaluation/Treatment: Yes Reason for Co-Treatment: For patient/therapist safety OT goals addressed during session: Strengthening/ROM;ADL's and self-care History of Present Illness: LEFT TOTAL HIP ARTHROPLASTY ANTERIOR APPROACH. Per daughter and pt, pt was only minimally ambulatory pta due to back issues and hip pain and used a rollator.       Prior Functioning     Home Living Family/patient expects to be discharged to:: Private residence Living Arrangements: Spouse/significant other Available Help at Discharge: Family;Available 24 hours/day Type of Home: House Home Access: Stairs to enter [employer] of Steps: 1--landing---1 Entrance Stairs-Rails: None Home Layout: One level Home Equipment: Walker - 4 wheels;Bedside commode Prior Function Level of Independence: Independent with assistive device(s) Comments: Used RW Communication Communication: No difficulties Dominant Hand: Right         Vision/Perception Vision - History Baseline Vision: Wears glasses all the time Patient Visual Report: No change from baseline   Cognition   Cognition Arousal/Alertness: Awake/alert Behavior During Therapy: WFL for tasks assessed/performed Overall Cognitive Status: Within Functional Limits for tasks assessed    Extremity/Trunk  Assessment Upper Extremity Assessment Upper Extremity Assessment: Overall WFL for tasks assessed     Mobility Bed Mobility Bed Mobility: Supine to Sit;Sitting - Scoot to Edge of Bed Supine to Sit: 1: +2 Total assist;With rails;HOB elevated (pt reports that she does not sleep on flat bed due to pre-exsisting back issues/surgeries) Supine to Sit: Patient Percentage: 40% Sitting - Scoot to Edge of Bed: 1: +2 Total assist;With rail Sitting - Scoot to [place] of Bed: Patient Percentage: 40% Details for Bed Mobility Assistance: VCs for sequencing Transfers Transfers: Sit to Stand;Stand to Sit Sit to Stand: 4: Min assist;With upper extremity assist;From elevated surface;From bed Stand to Sit: 4: Min guard;With upper extremity assist;With armrests;To chair/3-in-1 Details for Transfer Assistance: Increased time and VCs for safe hand placement           End of Session OT - End of Session Equipment Utilized During Treatment: Gait belt;Rolling walker Activity Tolerance: Patient limited by pain;Patient limited by fatigue Patient left: in chair;with call bell/phone within reach;with family/visitor present Nurse Communication: Mobility status (+2 stand pivot with RW)       Evette Georges [phone] 01/25/2013, 12:38 PM

## 2013-01-25 NOTE — Progress Notes (Signed)
Physical Therapy Treatment Patient Details Name: [name] MRN: [id number] DOB: 1950/04/09 Today's Date: 01/25/2013 Time: 4098-1191 PT Time Calculation (min): 32 min  PT Assessment / Plan / Recommendation  History of Present Illness Pt is a 62 y/o female admitted s/p L THA anterior approach   PT Comments   Pt progressing well towards physical therapy goals. She was able to improve gait distance and technique, however was visibly fatigued and required standing rest breaks towards the end of gait training. Overall good rehab effort, and anticipate pt will continue to be appropriate for d/c home with HHPT to follow.  Follow Up Recommendations  Home health PT     Does the patient have the potential to tolerate intense rehabilitation     Barriers to Discharge        Equipment Recommendations  Rolling walker with 5" wheels    Recommendations for Other Services    Frequency 7X/week   Progress towards PT Goals Progress towards PT goals: Progressing toward goals  Plan Current plan remains appropriate    Precautions / Restrictions Precautions Precautions: Fall Restrictions Weight Bearing Restrictions: Yes LLE Weight Bearing: Weight bearing as tolerated   Pertinent Vitals/Pain Pt reports 6/10 pain at rest    Mobility  Bed Mobility Bed Mobility: Sit to Supine;Scooting to HOB Supine to Sit: 1: +2 Total assist;With rails;HOB elevated Supine to Sit: Patient Percentage: 40% Sitting - Scoot to Edge of Bed: 1: +2 Total assist;With rail Sitting - Scoot to Edge of Bed: Patient Percentage: 40% Sit to Supine: 4: Min assist;HOB flat;With rail Scooting to [facility]: 5: Supervision;5: Set up Details for Bed Mobility Assistance: At home, pt props her trunk up on pillows due to back problems. VC's for sequencing and technique, assist with raising LLE into bed. Transfers Transfers: Sit to Stand;Stand to Sit Sit to Stand: 4: Min assist;From chair/3-in-1;With upper extremity assist Stand to Sit:  4: Min guard;With upper extremity assist;To bed Details for Transfer Assistance: VC's for hand placement and safety awareness. Ambulation/Gait Ambulation/Gait Assistance: 4: Min assist Ambulation Distance (Feet): 30 Feet Assistive device: Rolling walker Ambulation/Gait Assistance Details: VC's for sequencing and safety awareness with the RW. Assist occasionally for walker placement and to off weight trunk during step-through.  Gait Pattern: Step-to pattern;Decreased stride length;Shuffle Gait velocity: Decreased Stairs: No    Exercises Total Joint Exercises Ankle Circles/Pumps: 10 reps Quad Sets: 10 reps Gluteal Sets: 10 reps Short Arc Quad: 10 reps Heel Slides: 10 reps Hip ABduction/ADduction: 10 reps   PT Diagnosis: Difficulty walking;Acute pain  PT Problem List: Decreased strength;Decreased range of motion;Decreased activity tolerance;Decreased balance;Decreased mobility;Decreased knowledge of use of DME;Decreased safety awareness;Pain PT Treatment Interventions: DME instruction;Stair training;Gait training;Functional mobility training;Therapeutic activities;Therapeutic exercise;Neuromuscular re-education;Patient/family education   PT Goals (current goals can now be found in the care plan section) Acute Rehab PT Goals Patient Stated Goal: To go home PT Goal Formulation: With patient/family Time For Goal Achievement: 02/01/13 Potential to Achieve Goals: Good  Visit Information  Last PT Received On: 01/25/13 Assistance Needed: +1 PT/OT/SLP Co-Evaluation/Treatment: Yes Reason for Co-Treatment: For patient/therapist safety PT goals addressed during session: Mobility/safety with mobility;Balance;Proper use of DME OT goals addressed during session: Strengthening/ROM;ADL's and self-care History of Present Illness: Pt is a 62 y/o female admitted s/p L THA anterior approach    Subjective Data  Subjective: "I'm ready to get back into the bed." Patient Stated Goal: To go home    Cognition  Cognition Arousal/Alertness: Awake/alert Behavior During Therapy: [facility] for tasks assessed/performed Overall  Cognitive Status: Within Functional Limits for tasks assessed    Balance  Balance Balance Assessed: Yes Static Sitting Balance Static Sitting - Balance Support: Feet supported;Bilateral upper extremity supported Static Sitting - Level of Assistance: 5: Stand by assistance Static Standing Balance Static Standing - Balance Support: Bilateral upper extremity supported Static Standing - Level of Assistance: 4: Min assist  End of Session PT - End of Session Equipment Utilized During Treatment: Gait belt Activity Tolerance: Patient limited by pain;Patient limited by fatigue Patient left: in bed;with call bell/phone within reach;with family/visitor present Nurse Communication: Mobility status   GP     Ruthann Cancer 01/25/2013, 3:56 PM  Ruthann Cancer, PT, DPT [phone]

## 2013-01-25 NOTE — Evaluation (Signed)
Physical Therapy Evaluation Patient Details Name: [name] MRN: [id number] DOB: [birth year] Today's Date: 01/25/2013 Time: 4098-1191 PT Time Calculation (min): 20 min  PT Assessment / Plan / Recommendation History of Present Illness  Pt is a 62 y/o female admitted s/p L THA anterior approach  Clinical Impression  This patient presents with acute pain and decreased functional independence following the above mentioned procedure. At the time of PT eval, pt required increased time to complete transfers and ambulation. Back pain from previous surgeries limits pt's functional mobility. This patient is appropriate for skilled PT interventions to address functional limitations, improve safety and independence with functional mobility, and return to PLOF.     PT Assessment  Patient needs continued PT services    Follow Up Recommendations  Home health PT    Does the patient have the potential to tolerate intense rehabilitation      Barriers to Discharge        Equipment Recommendations  Rolling walker with 5" wheels    Recommendations for Other Services     Frequency 7X/week    Precautions / Restrictions Precautions Precautions: Fall Restrictions Weight Bearing Restrictions: Yes LLE Weight Bearing: Weight bearing as tolerated   Pertinent Vitals/Pain Pt reports 7/10 pain at beginning of session; pt received IV pain meds prior to session.       Mobility  Bed Mobility Bed Mobility: Supine to Sit;Sitting - Scoot to Edge of Bed Supine to Sit: 1: +2 Total assist;With rails;HOB elevated Supine to Sit: Patient Percentage: 40% Sitting - Scoot to Edge of Bed: 1: +2 Total assist;With rail Sitting - Scoot to [employer] of Bed: Patient Percentage: 40% Details for Bed Mobility Assistance: At home, pt props her trunk up on pillows due to back problems. VC's for sequencing and technique, assist with bed pad to transition to full sit. Transfers Transfers: Sit to Stand;Stand to Sit Sit to  Stand: 4: Min assist;With upper extremity assist;From elevated surface;From bed Stand to Sit: 4: Min guard;With upper extremity assist;With armrests;To chair/3-in-1 Details for Transfer Assistance: VC's for hand placement on seated surface. Pt required increased time for stand>sit, however no physical assist was required for controlled descent to the chair.  Ambulation/Gait Ambulation/Gait Assistance: 4: Min assist Ambulation Distance (Feet): 5 Feet Assistive device: Rolling walker Ambulation/Gait Assistance Details: VC's for sequencing and safety awareness with the RW. Min assist for walker placement. Gait Pattern: Step-to pattern;Decreased stride length;Shuffle Gait velocity: Decreased Stairs: No    Exercises Total Joint Exercises Ankle Circles/Pumps: 10 reps Quad Sets: 10 reps   PT Diagnosis: Difficulty walking;Acute pain  PT Problem List: Decreased strength;Decreased range of motion;Decreased activity tolerance;Decreased balance;Decreased mobility;Decreased knowledge of use of DME;Decreased safety awareness;Pain PT Treatment Interventions: DME instruction;Stair training;Gait training;Functional mobility training;Therapeutic activities;Therapeutic exercise;Neuromuscular re-education;Patient/family education     PT Goals(Current goals can be found in the care plan section) Acute Rehab PT Goals Patient Stated Goal: To go home PT Goal Formulation: With patient/family Time For Goal Achievement: 02/01/13 Potential to Achieve Goals: Good  Visit Information  Last PT Received On: 01/25/13 Assistance Needed: +2 PT/OT/SLP Co-Evaluation/Treatment: Yes Reason for Co-Treatment: For patient/therapist safety PT goals addressed during session: Mobility/safety with mobility;Balance;Proper use of DME OT goals addressed during session: Strengthening/ROM;ADL's and self-care History of Present Illness: Pt is a 62 y/o female admitted s/p L THA anterior approach       Prior Functioning  Home  Living Family/patient expects to be discharged to:: Private residence Living Arrangements: Spouse/significant other Available Help at Discharge: Family;Available  24 hours/day Type of Home: House Home Access: Stairs to enter [employer] of Steps: 1--landing---1 Entrance Stairs-Rails: None Home Layout: One level Home Equipment: Walker - 4 wheels;Bedside commode Prior Function Level of Independence: Independent with assistive device(s) Comments: Used rollator Communication Communication: No difficulties Dominant Hand: Right    Cognition  Cognition Arousal/Alertness: Awake/alert Behavior During Therapy: WFL for tasks assessed/performed Overall Cognitive Status: Within Functional Limits for tasks assessed    Extremity/Trunk Assessment Upper Extremity Assessment Upper Extremity Assessment: Overall WFL for tasks assessed Lower Extremity Assessment Lower Extremity Assessment: LLE deficits/detail LLE Deficits / Details: Decreased strength and AROM consistent with L THA Cervical / Trunk Assessment Cervical / Trunk Assessment: Normal   Balance Balance Balance Assessed: Yes Static Sitting Balance Static Sitting - Balance Support: Feet supported;Bilateral upper extremity supported Static Sitting - Level of Assistance: 5: Stand by assistance Static Standing Balance Static Standing - Balance Support: Bilateral upper extremity supported Static Standing - Level of Assistance: 4: Min assist  End of Session PT - End of Session Equipment Utilized During Treatment: Gait belt Activity Tolerance: Patient limited by pain;Patient limited by fatigue Patient left: in chair;with call bell/phone within reach;with family/visitor present Nurse Communication: Mobility status  GP     Ruthann Cancer 01/25/2013, 12:50 PM  Ruthann Cancer, PT, DPT [phone]

## 2013-01-25 NOTE — Progress Notes (Signed)
Subjective: 1 Day Post-Op Procedure(s) (LRB): LEFT TOTAL HIP ARTHROPLASTY ANTERIOR APPROACH (Left) Patient reports pain as moderate.  Eating and drinking well no complaints.  Objective: Vital signs in last 24 hours: Temp:  [97.9 F (36.6 C)-100 F (37.8 C)] 100 F (37.8 C) (12/17 0631) Pulse Rate:  [61-81] 69 (12/17 0631) Resp:  [12-22] 18 (12/17 0631) BP: (116-178)/(75-100) 150/76 mmHg (12/17 0631) SpO2:  [96 %-100 %] 99 % (12/17 0631) Weight:  [82.6 kg (182 lb 1.6 oz)] 82.6 kg (182 lb 1.6 oz) (12/16 1843)  Intake/Output from previous day: 12/16 0701 - 12/17 0700 In: 2373.8 [P.O.:240; I.V.:2033.8; IV Piggyback:100] Out: 1450 [Urine:1250; Blood:200] Intake/Output this shift:     Recent Labs  01/25/13 0410  HGB 11.4*    Recent Labs  01/25/13 0410  WBC 9.3  RBC 3.60*  HCT 32.9*  PLT 207    Recent Labs  01/25/13 0410  NA 136  K 4.0  CL 104  CO2 24  BUN 12  CREATININE 0.57  GLUCOSE 116*  CALCIUM 8.4   No results found for this basename: LABPT, INR,  in the last 72 hours  Sensation intact distally Intact pulses distally Dorsiflexion/Plantar flexion intact Incision: scant drainage Compartment soft  Assessment/Plan: 1 Day Post-Op Procedure(s) (LRB): LEFT TOTAL HIP ARTHROPLASTY ANTERIOR APPROACH (Left) Up with therapy  CLARK, GILBERT 01/25/2013, 8:42 AM

## 2013-01-26 ENCOUNTER — Encounter (HOSPITAL_COMMUNITY): Payer: Self-pay | Admitting: Orthopaedic Surgery

## 2013-01-26 LAB — CBC
MCH: 31.1 pg (ref 26.0–34.0)
MCHC: 33.5 g/dL (ref 30.0–36.0)
MCV: 92.7 fL (ref 78.0–100.0)
Platelets: 179 10*3/uL (ref 150–400)
RBC: 3.54 MIL/uL — ABNORMAL LOW (ref 3.87–5.11)
RDW: 13 % (ref 11.5–15.5)
WBC: 12.4 10*3/uL — ABNORMAL HIGH (ref 4.0–10.5)

## 2013-01-26 NOTE — Progress Notes (Signed)
Physical Therapy Treatment Patient Details Name: [name] MRN: [id number] DOB: 07-Apr-1950 Today's Date: 01/26/2013 Time: 1000-1031 PT Time Calculation (min): 31 min  PT Assessment / Plan / Recommendation  History of Present Illness Pt is a 62 y/o female admitted s/p L THA anterior approach   PT Comments   Pt reports being fatigued and with increased stiffness this morning, which was limiting her ambulation distance and ability to perform therapeutic exercise. Overall good rehab effort, and pt is continuing to show improvement with functional mobility. Still anticipate home with HHPT to follow will be appropriate.   Follow Up Recommendations  Home health PT     Does the patient have the potential to tolerate intense rehabilitation     Barriers to Discharge        Equipment Recommendations  Rolling walker with 5" wheels    Recommendations for Other Services    Frequency 7X/week   Progress towards PT Goals Progress towards PT goals: Progressing toward goals  Plan Current plan remains appropriate    Precautions / Restrictions Precautions Precautions: Fall Restrictions Weight Bearing Restrictions: Yes LLE Weight Bearing: Weight bearing as tolerated   Pertinent Vitals/Pain 8/10 at rest after ambulation     Mobility  Bed Mobility Bed Mobility: Supine to Sit;Sitting - Scoot to [employer] of Bed;Sit to Supine;Scooting to [facility] Supine to Sit: 4: Min assist;HOB elevated;With rails Sitting - Scoot to Edge of Bed: 4: Min assist Sit to Supine: 4: Min assist;HOB flat;With rail Scooting to [facility]: 5: Supervision;5: Set up Details for Bed Mobility Assistance: At home, pt props her trunk up on pillows due to back problems. VC's for sequencing and technique, assist with support and raising LLE into bed. Bed pad used to assist pt to transition to EOB. Transfers Transfers: Sit to Stand;Stand to Sit Sit to Stand: 4: Min assist;With upper extremity assist;From bed Stand to Sit: 4: Min guard;With  upper extremity assist;To bed Details for Transfer Assistance: VC's for hand placement and safety awareness. Ambulation/Gait Ambulation/Gait Assistance: 4: Min assist Ambulation Distance (Feet): 15 Feet Assistive device: Rolling walker Ambulation/Gait Assistance Details: VC's for sequencing and safety awareness with the RW. Pt reports stiffness in hip is limiting her this morning.  Gait Pattern: Step-to pattern;Decreased stride length;Shuffle Gait velocity: Decreased Stairs: No    Exercises Total Joint Exercises Ankle Circles/Pumps: 10 reps Quad Sets: 10 reps Hip ABduction/ADduction: 10 reps   PT Diagnosis:    PT Problem List:   PT Treatment Interventions:     PT Goals (current goals can now be found in the care plan section) Acute Rehab PT Goals Patient Stated Goal: To go home PT Goal Formulation: With patient/family Time For Goal Achievement: 02/01/13 Potential to Achieve Goals: Good  Visit Information  Last PT Received On: 01/26/13 Assistance Needed: +1 History of Present Illness: Pt is a 62 y/o female admitted s/p L THA anterior approach    Subjective Data  Subjective: "I just got back to the bed, can I lay down when we are finished?" Patient Stated Goal: To go home   Cognition  Cognition Arousal/Alertness: Awake/alert Behavior During Therapy: WFL for tasks assessed/performed Overall Cognitive Status: Within Functional Limits for tasks assessed    Balance     End of Session PT - End of Session Equipment Utilized During Treatment: Gait belt Activity Tolerance: Patient limited by pain;Patient limited by fatigue Patient left: in bed;with call bell/phone within reach;with family/visitor present Nurse Communication: Mobility status   GP     Ruthann Cancer  01/26/2013, 12:14 PM  Ruthann Cancer, PT, DPT [phone]

## 2013-01-26 NOTE — Progress Notes (Signed)
Subjective: 2 Days Post-Op Procedure(s) (LRB): LEFT TOTAL HIP ARTHROPLASTY ANTERIOR APPROACH (Left) Patient reports pain as moderate.    Objective: Vital signs in last 24 hours: Temp:  [97.9 F (36.6 C)-100.5 F (38.1 C)] 97.9 F (36.6 C) (12/18 0556) Pulse Rate:  [80-90] 81 (12/18 0556) Resp:  [16-19] 18 (12/18 0556) BP: (120-144)/(64-68) 144/64 mmHg (12/18 0556) SpO2:  [98 %] 98 % (12/18 0556)  Intake/Output from previous day: 12/17 0701 - 12/18 0700 In: 2205 [P.O.:480; I.V.:1725] Out: 250 [Urine:250] Intake/Output this shift: Total I/O In: 900 [I.V.:900] Out: -    Recent Labs  01/25/13 0410 01/26/13 0505  HGB 11.4* 11.0*    Recent Labs  01/25/13 0410 01/26/13 0505  WBC 9.3 12.4*  RBC 3.60* 3.54*  HCT 32.9* 32.8*  PLT 207 179    Recent Labs  01/25/13 0410  NA 136  K 4.0  CL 104  CO2 24  BUN 12  CREATININE 0.57  GLUCOSE 116*  CALCIUM 8.4   No results found for this basename: LABPT, INR,  in the last 72 hours  Sensation intact distally Intact pulses distally Dorsiflexion/Plantar flexion intact Incision: dressing C/D/I  Assessment/Plan: 2 Days Post-Op Procedure(s) (LRB): LEFT TOTAL HIP ARTHROPLASTY ANTERIOR APPROACH (Left) Up with therapy Plan for discharge tomorrow  Kathryne Hitch 01/26/2013, 6:42 AM

## 2013-01-26 NOTE — Progress Notes (Signed)
Physical Therapy Treatment Patient Details Name: [name] MRN: [id number] DOB: [birth year] Today's Date: 01/26/2013 Time: 4010-2725 PT Time Calculation (min): 28 min  PT Assessment / Plan / Recommendation  History of Present Illness Pt is a 62 y/o female admitted s/p L THA anterior approach. Pt also with history of multiple back surgeries.   PT Comments   Pt having great difficulty mobilizing after Lt anterior THA.  The amount of time pt takes to mobilize isn't functional in a household setting. Still only able to amb very short distance and requiring mod assistance. Feel pt needs ST-SNF prior to return home.  Pt/husband agreeable.  Follow Up Recommendations  SNF     Does the patient have the potential to tolerate intense rehabilitation     Barriers to Discharge        Equipment Recommendations  Rolling walker with 5" wheels    Recommendations for Other Services    Frequency 7X/week   Progress towards PT Goals Progress towards PT goals: Not progressing toward goals - comment (Pt very slow to mobilize.)  Plan Discharge plan needs to be updated    Precautions / Restrictions Precautions Precautions: Fall Restrictions LLE Weight Bearing: Weight bearing as tolerated   Pertinent Vitals/Pain VSS    Mobility  Bed Mobility Supine to Sit: 4: Min assist;HOB elevated;With rails Sitting - Scoot to Edge of Bed: 4: Min assist Sit to Supine: 4: Min assist;With rail;HOB elevated Details for Bed Mobility Assistance: Pt with assist to move LLE and with very slow movement to the EOB.  Took pt ~5 minutes to get to EOB. Transfers Sit to Stand: 3: Mod assist;With upper extremity assist;From bed;From elevated surface;With armrests;From chair/3-in-1 Stand to Sit: 4: Min assist;With upper extremity assist;With armrests;To chair/3-in-1;To bed Details for Transfer Assistance: Verbal/tactile cues for hand placement and foot placement..  Assist to bring hips up into standing even with bed  raised. Ambulation/Gait Ambulation/Gait Assistance: 3: Mod assist Ambulation Distance (Feet): 20 Feet Assistive device: Rolling walker Ambulation/Gait Assistance Details: Pt unable to lift left foot off of floor to advance leg.  Pt only able to slide leg forward in very short stuttering advancements.  Frequent verbal/tactile cues to stand more erect and stay closer to the walker. Gait Pattern: Step-to pattern;Decreased step length - left;Decreased stance time - left;Decreased step length - right;Decreased stance time - right;Decreased hip/knee flexion - left;Right flexed knee in stance;Trunk flexed;Shuffle Gait velocity: Very slow ~15 minutes to amb 20'.    Exercises     PT Diagnosis:    PT Problem List:   PT Treatment Interventions:     PT Goals (current goals can now be found in the care plan section)    Visit Information  Last PT Received On: 01/26/13 Assistance Needed: +1 History of Present Illness: Pt is a 62 y/o female admitted s/p L THA anterior approach. Pt also with history of multiple back surgeries.    Subjective Data      Cognition  Cognition Arousal/Alertness: Awake/alert Behavior During Therapy: WFL for tasks assessed/performed Overall Cognitive Status: Within Functional Limits for tasks assessed    Balance  Static Standing Balance Static Standing - Balance Support: Bilateral upper extremity supported Static Standing - Level of Assistance: 4: Min assist  End of Session PT - End of Session Equipment Utilized During Treatment: Gait belt Activity Tolerance: Patient limited by fatigue Patient left: in bed;with call bell/phone within reach;with family/visitor present Nurse Communication: Mobility status   GP     [facility] 01/26/2013, 4:26  PM  [employer] PT [phone]

## 2013-01-26 NOTE — Care Management Note (Signed)
CARE MANAGEMENT NOTE 01/26/2013  Comments:  01/26/13 Vance Peper, RN BSN Case Manager Physical therapist states patient cannot discharge to home, she needs treatment at [facility]. Social worker has been notified.

## 2013-01-27 LAB — CBC
HCT: 31.4 % — ABNORMAL LOW (ref 36.0–46.0)
Hemoglobin: 10.6 g/dL — ABNORMAL LOW (ref 12.0–15.0)
MCH: 31.1 pg (ref 26.0–34.0)
MCV: 92.1 fL (ref 78.0–100.0)
Platelets: 191 10*3/uL (ref 150–400)
RBC: 3.41 MIL/uL — ABNORMAL LOW (ref 3.87–5.11)
RDW: 12.9 % (ref 11.5–15.5)
WBC: 12.2 10*3/uL — ABNORMAL HIGH (ref 4.0–10.5)

## 2013-01-27 MED ORDER — ASPIRIN 325 MG PO TBEC: 325.0000 mg | DELAYED_RELEASE_TABLET | Freq: Two times a day (BID) | ORAL | Status: DC

## 2013-01-27 MED ORDER — OXYCODONE-ACETAMINOPHEN 5-325 MG PO TABS: 1.0000 | ORAL_TABLET | ORAL | Status: DC | PRN

## 2013-01-27 NOTE — Care Management Note (Signed)
01/27/13  Vance Peper, RN BSN  Patient will go home with home health. Has been setup with [facility] and has family support at discharge.

## 2013-01-27 NOTE — Progress Notes (Signed)
Physical Therapy Treatment Patient Details Name: [name] MRN: [id number] DOB: 1950-06-27 Today's Date: 01/27/2013 Time: 4098-1191 PT Time Calculation (min): 25 min  PT Assessment / Plan / Recommendation  History of Present Illness Pt is a 62 y/o female admitted s/p L THA anterior approach. Pt also with history of multiple back surgeries.   PT Comments   Pt unable to ambulate as far as she did this AM but did complete stair training.  Pt states it's not the pain that is limiting her, but "my body just feels weak & heavy".   Overall pt presents at min guard level for sit<>stand transfers & ambulation but mobility appears very effortful for her.  At this time, pt is still refusing SNF therefore d/c plans will be updated to Home with HHPT + 24 hr assist.     Follow Up Recommendations  Home health PT;Supervision/Assistance - 24 hour     Does the patient have the potential to tolerate intense rehabilitation     Barriers to Discharge        Equipment Recommendations  Rolling walker with 5" wheels    Recommendations for Other Services    Frequency 7X/week   Progress towards PT Goals Progress towards PT goals: Progressing toward goals  Plan Current plan remains appropriate    Precautions / Restrictions Precautions Precautions: Fall Restrictions Weight Bearing Restrictions: Yes LLE Weight Bearing: Weight bearing as tolerated   Pertinent Vitals/Pain "I don't have any pain.  It's just my body feels heavy"    Mobility  Bed Mobility Bed Mobility: Supine to Sit;Sitting - Scoot to Edge of Bed Supine to Sit: HOB flat;4: Min assist Sitting - Scoot to Edge of Bed: 4: Min guard Details for Bed Mobility Assistance: (A) for LLE management.  Incr time & effortful Transfers Transfers: Sit to Stand;Stand to Sit Sit to Stand: 4: Min guard;With upper extremity assist;With armrests;From chair/3-in-1;From bed Stand to Sit: 4: Min guard;With upper extremity assist;With armrests;To  chair/3-in-1 Details for Transfer Assistance: Cues for safest hand placement, & cues to use UE's to control descent.   Ambulation/Gait Ambulation/Gait Assistance: 4: Min guard Ambulation Distance (Feet): 30 Feet Assistive device: Rolling walker Ambulation/Gait Assistance Details: Cues for sequencing, body positioning inside RW, & posture.  Pt fatigues quickly.  appears very effortful especially as distance increased.    Gait Pattern: Step-to pattern;Decreased step length - right;Decreased step length - left;Decreased weight shift to left;Trunk flexed (decreased step height) Gait velocity: Decreased Stairs: Yes Stairs Assistance: 4: Min guard Stairs Assistance Details (indicate cue type and reason): cues for sequencing & technique.   Stair Management Technique: No rails;Forwards;With walker Number of Stairs: 1 (2x's)    Exercises Total Joint Exercises Ankle Circles/Pumps: AROM;Both;10 reps Heel Slides: AAROM;Strengthening;Left;10 reps;Supine Long Arc Quad: AROM;Strengthening;Left;10 reps Marching in Standing: AAROM;Strengthening;Left;10 reps;Seated     PT Goals (current goals can now be found in the care plan section) Acute Rehab PT Goals Patient Stated Goal: To go home PT Goal Formulation: With patient/family Time For Goal Achievement: 02/01/13 Potential to Achieve Goals: Good  Visit Information  Last PT Received On: 01/27/13 Assistance Needed: +1 History of Present Illness: Pt is a 62 y/o female admitted s/p L THA anterior approach. Pt also with history of multiple back surgeries.    Subjective Data  Subjective: "I'm ready to go, but my doctor said not today." Patient Stated Goal: To go home   Cognition  Cognition Arousal/Alertness: Awake/alert Behavior During Therapy: WFL for tasks assessed/performed Overall Cognitive Status: Within  Functional Limits for tasks assessed    Balance  Balance Balance Assessed: Yes Static Sitting Balance Static Sitting - Balance Support:  Feet supported;Bilateral upper extremity supported Static Sitting - Level of Assistance: 5: Stand by assistance Static Standing Balance Static Standing - Balance Support: Bilateral upper extremity supported Static Standing - Level of Assistance: 5: Stand by assistance Dynamic Standing Balance Dynamic Standing - Balance Support: Left upper extremity supported;During functional activity Dynamic Standing - Level of Assistance: 5: Stand by assistance  End of Session PT - End of Session Equipment Utilized During Treatment: Gait belt Activity Tolerance: Patient tolerated treatment well;Patient limited by fatigue Patient left: in chair;with call bell/phone within reach;with family/visitor present Nurse Communication: Mobility status   GP     Lara Mulch 01/27/2013, 2:25 PM   Verdell Face, PTA [phone] 01/27/2013

## 2013-01-27 NOTE — Progress Notes (Signed)
Subjective: 3 Days Post-Op Procedure(s) (LRB): LEFT TOTAL HIP ARTHROPLASTY ANTERIOR APPROACH (Left) Patient reports pain as moderate.  Therapy recommending ST-SNF, but patient wants to go home.  Objective: Vital signs in last 24 hours: Temp:  [99.4 F (37.4 C)-100.3 F (37.9 C)] 99.8 F (37.7 C) (12/19 0551) Pulse Rate:  [82-104] 82 (12/19 0551) Resp:  [16-18] 18 (12/19 0551) BP: (127-130)/(58-65) 127/58 mmHg (12/18 1933) SpO2:  [94 %-96 %] 96 % (12/19 0551)  Intake/Output from previous day: 12/18 0701 - 12/19 0700 In: 1841 [P.O.:840; I.V.:1001] Out: -  Intake/Output this shift:     Recent Labs  01/25/13 0410 01/26/13 0505  HGB 11.4* 11.0*    Recent Labs  01/25/13 0410 01/26/13 0505  WBC 9.3 12.4*  RBC 3.60* 3.54*  HCT 32.9* 32.8*  PLT 207 179    Recent Labs  01/25/13 0410  NA 136  K 4.0  CL 104  CO2 24  BUN 12  CREATININE 0.57  GLUCOSE 116*  CALCIUM 8.4   No results found for this basename: LABPT, INR,  in the last 72 hours  Sensation intact distally Intact pulses distally Dorsiflexion/Plantar flexion intact Incision: scant drainage  Assessment/Plan: 3 Days Post-Op Procedure(s) (LRB): LEFT TOTAL HIP ARTHROPLASTY ANTERIOR APPROACH (Left) Up with therapy SNF vs home; needs FL-2 on chart. Will check on her progress tonight  Kathryne Hitch 01/27/2013, 7:10 AM

## 2013-01-27 NOTE — Progress Notes (Signed)
Patient ID: [name], female   DOB: Feb 08, 1951, 62 y.o.   MRN: [id number] Please note that her hgb is slightly down today.  She has normal vitals signs and is completely asymptomatic from this.  She was on IV fluids at a high rate, so one could argue that this is more dilutional than a true acute blood loss anemia from surgery.  Just a thought.

## 2013-01-27 NOTE — Progress Notes (Signed)
Clinical Social Work received consult for SNF placement. CSW met with patient to discuss D/C options. Patient's daughter T.Y was at bedside. Patient and daughter both refused SNF placement. Patient reported that she has 24 hour support at home including her husband, niece and daughter. CSW made case manager aware of above. CSW signing off.  Jetta Lout, LCSWA Weekend CSW [phone]

## 2013-01-27 NOTE — Progress Notes (Signed)
Occupational Therapy Treatment Patient Details Name: [name] MRN: [id number] DOB: 13-Mar-1950 Today's Date: 01/27/2013 Time: 4098-1191 OT Time Calculation (min): 40 min  OT Assessment / Plan / Recommendation  History of present illness Pt is a 62 y/o female admitted s/p L THA anterior approach. Pt also with history of multiple back surgeries.   OT comments  Pt. Awake and agreeable to participation in skilled ot.  dtr present for session and both state they DO NOT WANT SNF placement.  Aware pt. Is moving slow and needs assistance but report she will have at least 4 family members that can provide 24/7 assistance and also agreeable to hh therapies and also plan to purchase a/e kit for lb adls.  (pt. Has guardianship of 58 year old nephew and is eager to return home to con't. Care for him)  Follow Up Recommendations  Home health OT                      Frequency Min 2X/week   Progress towards OT Goals Progress towards OT goals: Progressing toward goals  Plan Discharge plan remains appropriate    Precautions / Restrictions Precautions Precautions: Fall Restrictions LLE Weight Bearing: Weight bearing as tolerated   Pertinent Vitals/Pain 3/10,     ADL  Lower Body Dressing: Performed;Set up Where Assessed - Lower Body Dressing: Supported sitting Toilet Transfer: Performed;Minimal assistance Toilet Transfer Method: Sit to Barista: Regular height toilet;Grab bars Toileting - Clothing Manipulation and Hygiene: Performed;Minimal assistance Where Assessed - Engineer, mining and Hygiene: Standing;Sit on 3-in-1 or toilet Transfers/Ambulation Related to ADLs: slow with con't. cues for hand placment and encouragement to push through arms to aide in transition of sit/stand ADL Comments: able to return demo of a/e and plans to purchase a/e kit from gift shop for lb adls.  moves slow but able to complete all aspects of toileting with min guard/min  a       OT Goals(current goals can now be found in the care plan section)    Visit Information  Last OT Received On: 01/27/13 History of Present Illness: Pt is a 62 y/o female admitted s/p L THA anterior approach. Pt also with history of multiple back surgeries.    Subjective Data   "i know i need some help but i need to be at home"          Cognition  Cognition Arousal/Alertness: Awake/alert Behavior During Therapy: WFL for tasks assessed/performed Overall Cognitive Status: Within Functional Limits for tasks assessed    Mobility  Bed Mobility Details for Bed Mobility Assistance: states hob elevated at home with a lot of pillows.  bed set to simulate this environment, pt. able to bring b les out of bed with increased time and inst. for using ues to guide LLE out of bed Transfers Transfers: Sit to Stand;Stand to Sit Sit to Stand: 4: Min assist;3: Mod assist;With upper extremity assist;From chair/3-in-1;From bed Stand to Sit: 4: Min guard;4: Min assist;With armrests;To chair/3-in-1;To toilet Details for Transfer Assistance: inst. cues for hand and foot placement               End of Session OT - End of Session Equipment Utilized During Treatment: Rolling walker Activity Tolerance: Patient tolerated treatment well Patient left: in chair;with call bell/phone within reach;with family/visitor present       [name], COTA/L 01/27/2013, 7:35 AM

## 2013-01-27 NOTE — Progress Notes (Signed)
Physical Therapy Treatment Patient Details Name: [name] MRN: [id number] DOB: 08/21/1950 Today's Date: 01/27/2013 Time: 4098-1191 PT Time Calculation (min): 31 min  PT Assessment / Plan / Recommendation  History of Present Illness Pt is a 62 y/o female admitted s/p L THA anterior approach. Pt also with history of multiple back surgeries.   PT Comments   Spoke again with pt and daughter regarding PT recommendation for SNF placement at d/c, and benefits of STR were discussed. Pt still wants to return home, however she is aware that the recommendation stands as an option in case she changes her mind.   Follow Up Recommendations  SNF     Does the patient have the potential to tolerate intense rehabilitation     Barriers to Discharge        Equipment Recommendations  Rolling walker with 5" wheels    Recommendations for Other Services    Frequency 7X/week   Progress towards PT Goals Progress towards PT goals: Progressing toward goals  Plan Current plan remains appropriate    Precautions / Restrictions Precautions Precautions: Fall Restrictions Weight Bearing Restrictions: Yes LLE Weight Bearing: Weight bearing as tolerated   Pertinent Vitals/Pain Pt reports 4/10 pain at rest    Mobility  Bed Mobility Bed Mobility: Supine to Sit;Sitting - Scoot to Edge of Bed Supine to Sit: 4: Min assist Sitting - Scoot to Edge of Bed: 4: Min assist Details for Bed Mobility Assistance: VC's for sequencing and technique. Assist for movement of LLE to EOB. Transfers Transfers: Sit to Stand;Stand to Sit Sit to Stand: 4: Min guard;From bed;From toilet;With upper extremity assist Stand to Sit: 4: Min guard;To bed;To chair/3-in-1;To toilet Details for Transfer Assistance: VC's for hand placement and controlled descent to chair Ambulation/Gait Ambulation/Gait Assistance: 4: Min guard Ambulation Distance (Feet): 50 Feet Assistive device: Rolling walker Ambulation/Gait Assistance Details:  VC's for sequencing and safety awareness with the RW. Encouragement for distance and improved posture. Gait Pattern: Step-to pattern;Decreased stride length;Trunk flexed Gait velocity: Decreased Stairs: No    Exercises     PT Diagnosis:    PT Problem List:   PT Treatment Interventions:     PT Goals (current goals can now be found in the care plan section) Acute Rehab PT Goals Patient Stated Goal: To go home PT Goal Formulation: With patient/family Time For Goal Achievement: 02/01/13 Potential to Achieve Goals: Good  Visit Information  Last PT Received On: 01/27/13 Assistance Needed: +1 History of Present Illness: Pt is a 62 y/o female admitted s/p L THA anterior approach. Pt also with history of multiple back surgeries.    Subjective Data  Subjective: "I'm ready to go, but my doctor said not today." Patient Stated Goal: To go home   Cognition  Cognition Arousal/Alertness: Awake/alert Behavior During Therapy: WFL for tasks assessed/performed Overall Cognitive Status: Within Functional Limits for tasks assessed    Balance  Balance Balance Assessed: Yes Static Sitting Balance Static Sitting - Balance Support: Feet supported;Bilateral upper extremity supported Static Sitting - Level of Assistance: 5: Stand by assistance Static Standing Balance Static Standing - Balance Support: Bilateral upper extremity supported Static Standing - Level of Assistance: 5: Stand by assistance Dynamic Standing Balance Dynamic Standing - Balance Support: Left upper extremity supported;During functional activity Dynamic Standing - Level of Assistance: 5: Stand by assistance  End of Session PT - End of Session Equipment Utilized During Treatment: Gait belt Activity Tolerance: Patient limited by fatigue Patient left: in chair;with call bell/phone within reach;with family/visitor  present Nurse Communication: Mobility status   GP     Ruthann Cancer 01/27/2013, 11:56 AM  Ruthann Cancer,  PT, DPT [phone]

## 2013-01-28 NOTE — Progress Notes (Signed)
Subjective: 4 Days Post-Op Procedure(s) (LRB): LEFT TOTAL HIP ARTHROPLASTY ANTERIOR APPROACH (Left) Patient reports pain as mild.    Objective: Vital signs in last 24 hours: Temp:  [98.9 F (37.2 C)-100 F (37.8 C)] 100 F (37.8 C) (12/20 0606) Pulse Rate:  [77-89] 78 (12/20 0606) Resp:  [16-18] 16 (12/20 0733) BP: (117-152)/(61-63) 122/62 mmHg (12/20 0606) SpO2:  [94 %-100 %] 94 % (12/20 0733)  Intake/Output from previous day: 12/19 0701 - 12/20 0700 In: 850 [P.O.:850] Out: -  Intake/Output this shift: Total I/O In: 360 [P.O.:360] Out: -    Recent Labs  01/26/13 0505 01/27/13 0548  HGB 11.0* 10.6*    Recent Labs  01/26/13 0505 01/27/13 0548  WBC 12.4* 12.2*  RBC 3.54* 3.41*  HCT 32.8* 31.4*  PLT 179 191   No results found for this basename: NA, K, CL, CO2, BUN, CREATININE, GLUCOSE, CALCIUM,  in the last 72 hours No results found for this basename: LABPT, INR,  in the last 72 hours  Neurologically intact  Assessment/Plan: 4 Days Post-Op Procedure(s) (LRB): LEFT TOTAL HIP ARTHROPLASTY ANTERIOR APPROACH (Left) Discharge home with home health  YATES,MARK C 01/28/2013, 10:20 AM

## 2013-01-28 NOTE — Discharge Summary (Signed)
Patient ID: [name] MRN: [id number] DOB/AGE: 10-27-1950 62 y.o.  Admit date: 01/24/2013 Discharge date: 01/28/2013  Admission Diagnoses:  Principal Problem:   Degenerative arthritis of left hip Active Problems:   Status post THR (total hip replacement)   Discharge Diagnoses:  Same  Past Medical History  Diagnosis Date  . Hepatitis     "c"  . Hx of bronchitis     Surgeries: Procedure(s): LEFT TOTAL HIP ARTHROPLASTY ANTERIOR APPROACH on 01/24/2013   Consultants:    Discharged Condition: Improved  Hospital Course: [name] is an 62 y.o. female who was admitted 01/24/2013 for operative treatment ofDegenerative arthritis of hip. Patient has severe unremitting pain that affects sleep, daily activities, and work/hobbies. After pre-op clearance the patient was taken to the operating room on 01/24/2013 and underwent  Procedure(s): LEFT TOTAL HIP ARTHROPLASTY ANTERIOR APPROACH.    Patient was given perioperative antibiotics: Anti-infectives   Start     Dose/Rate Route Frequency Ordered Stop   01/24/13 2045  ceFAZolin (ANCEF) IVPB 1 g/50 mL premix     1 g 100 mL/hr over 30 Minutes Intravenous Every 6 hours 01/24/13 1851 01/25/13 0418   01/24/13 0600  ceFAZolin (ANCEF) IVPB 2 g/50 mL premix     2 g 100 mL/hr over 30 Minutes Intravenous On call to O.R. 01/23/13 1508 01/24/13 1445       Patient was given sequential compression devices, early ambulation, and chemoprophylaxis to prevent DVT.  Patient benefited maximally from hospital stay and there were no complications.    Recent vital signs: Patient Vitals for the past 24 hrs:  BP Temp Temp src Pulse Resp SpO2  01/28/13 1118 - - - - 16 95 %  01/28/13 0733 - - - - 16 94 %  01/28/13 0606 122/62 mmHg 100 F (37.8 C) Oral 78 16 94 %  01/28/13 0400 - - - - 17 -  01/28/13 0000 - - - - 16 -  01/27/13 2200 152/61 mmHg 99.9 F (37.7 C) Oral 77 18 95 %  01/27/13 2000 - - - - 16 -  01/27/13 1535 - - - - 18 100 %      Recent laboratory studies:  Recent Labs  01/26/13 0505 01/27/13 0548  WBC 12.4* 12.2*  HGB 11.0* 10.6*  HCT 32.8* 31.4*  PLT 179 191     Discharge Medications:     Medication List    STOP taking these medications       HYDROcodone-acetaminophen 10-325 MG per tablet  Commonly known as:  NORCO      TAKE these medications       aspirin 325 MG EC tablet  Take 1 tablet (325 mg total) by mouth 2 (two) times daily after a meal.     oxyCODONE-acetaminophen 5-325 MG per tablet  Commonly known as:  ROXICET  Take 1-2 tablets by mouth every 4 (four) hours as needed for severe pain.        Diagnostic Studies: Dg Hip Operative Left  01/24/2013   CLINICAL DATA:  History of avascular necrosis, left anterior total hip replacement  EXAM: OPERATIVE LEFT HIP  COMPARISON:  Left hip MRI -12/06/2011  FLUOROSCOPY TIME:  44 seconds  FINDINGS: Three spot intraoperative fluoroscopic images of the lower pelvis and left hip are provided for review.  Initial image demonstrates increased sclerosis of the bilateral femoral heads compatible with history of avascular necrosis better demonstrated on prior pelvis MRI.  Subsequent images demonstrate the sequela of a left total hip  replacement. Alignment appears near anatomic given solitary projection. No definite fracture. There is a minimal amount of expected subcutaneous emphysema about the operative site.  No definite radiopaque foreign body.  IMPRESSION: Post left total hip replacement without definite evidence of complication.   Electronically Signed   By: Simonne Come M.D.   On: 01/24/2013 17:04   Dg Pelvis Portable  01/24/2013   CLINICAL DATA:  Status post left hip replacement  EXAM: PORTABLE PELVIS 1-2 VIEWS  COMPARISON:  None.  FINDINGS: A left hip prosthesis is noted. No acute fracture or dislocation is seen. No acute soft tissue abnormality is noted.   Electronically Signed   By: Alcide Clever M.D.   On: 01/24/2013 21:03   Dg Hip Portable 1 View  Left  01/24/2013   CLINICAL DATA:  Status post left hip replacement  EXAM: PORTABLE LEFT HIP - 1 VIEW  COMPARISON:  None.  FINDINGS: A left hip replacement is now seen in satisfactory position. No acute abnormality is noted.   Electronically Signed   By: Alcide Clever M.D.   On: 01/24/2013 20:08    Disposition: 01-Home or Self Care      Discharge Orders   Future Orders Complete By Expires   Discharge wound care:  As directed    Comments:     May shower with dressing intact.Keep dressing cleanand intact until Monday, then remove and shower. Apply clean dressing after showering.   Weight bearing as tolerated  As directed    Questions:     Laterality:     Extremity:        Follow-up Information   Follow up with Kathryne Hitch, MD. Schedule an appointment as soon as possible for a visit in 2 weeks.   Specialty:  Orthopedic Surgery   Contact information:   [address] [place] [address] [phone]        Signed: Kathryne Hitch 01/28/2013, 2:23 PM

## 2013-01-28 NOTE — Progress Notes (Signed)
Physical Therapy Treatment Patient Details Name: [name] MRN: [id number] DOB: [birth year] Today's Date: 01/28/2013 Time: 4098-1191 PT Time Calculation (min): 25 min  PT Assessment / Plan / Recommendation  History of Present Illness Pt is a 62 y/o female admitted s/p L THA anterior approach. Pt also with history of multiple back surgeries.   PT Comments   Pt making progress with mobility as she was able to increase ambulation distance & required decreased assistance for all aspects of mobility.      Follow Up Recommendations  Home health PT;Supervision/Assistance - 24 hour     Does the patient have the potential to tolerate intense rehabilitation     Barriers to Discharge        Equipment Recommendations  Rolling walker with 5" wheels    Recommendations for Other Services    Frequency 7X/week   Progress towards PT Goals Progress towards PT goals: Progressing toward goals  Plan Current plan remains appropriate    Precautions / Restrictions Precautions Precautions: Fall Restrictions Weight Bearing Restrictions: Yes LLE Weight Bearing: Weight bearing as tolerated       Mobility  Bed Mobility Bed Mobility: Supine to Sit;Sitting - Scoot to Edge of Bed;Sit to Supine Supine to Sit: HOB flat;6: Modified independent (Device/Increase time) Sitting - Scoot to Edge of Bed: 6: Modified independent (Device/Increase time) Sit to Supine: HOB flat;6: Modified independent (Device/Increase time) Details for Bed Mobility Assistance: No physical (A) needed.   Transfers Transfers: Sit to Stand;Stand to Sit Sit to Stand: 5: Supervision;With upper extremity assist;From bed Stand to Sit: 5: Supervision;With upper extremity assist;To bed Details for Transfer Assistance: cues to reinforce safest hand placement Ambulation/Gait Ambulation/Gait Assistance: 5: Supervision Ambulation Distance (Feet): 80 Feet Assistive device: Rolling walker Ambulation/Gait Assistance Details: cues to stay  closer to RW, increase LLE WBing to allow sufficient step/stride length RLE.   Gait Pattern: Step-through pattern;Decreased stride length;Decreased weight shift to left;Antalgic Gait velocity: improving General Gait Details: slightly self-limiting but able to increase ambulation distance with encouragement Stairs: No    Exercises Total Joint Exercises Ankle Circles/Pumps: AROM;Both;15 reps Heel Slides: AROM;Strengthening;Left;10 reps Straight Leg Raises: AAROM;Strengthening;Left;10 reps Long Arc Quad: AROM;Strengthening;Left;10 reps Marching in Standing: AROM;Strengthening;Left;10 reps    PT Goals (current goals can now be found in the care plan section) Acute Rehab PT Goals Patient Stated Goal: To go home PT Goal Formulation: With patient/family Time For Goal Achievement: 02/01/13 Potential to Achieve Goals: Good  Visit Information  Last PT Received On: 01/28/13 Assistance Needed: +1 History of Present Illness: Pt is a 62 y/o female admitted s/p L THA anterior approach. Pt also with history of multiple back surgeries.    Subjective Data  Patient Stated Goal: To go home   Cognition  Cognition Arousal/Alertness: Awake/alert Behavior During Therapy: WFL for tasks assessed/performed Overall Cognitive Status: Within Functional Limits for tasks assessed    Balance     End of Session PT - End of Session Activity Tolerance: Patient tolerated treatment well Patient left: in bed;with call bell/phone within reach Nurse Communication: Mobility status   GP     Lara Mulch 01/28/2013, 10:03 AM   Verdell Face, PTA [phone] 01/28/2013

## 2013-01-28 NOTE — Consult Note (Signed)
Spoke to patient and daughter and they stated that she already has walker at home.

## 2013-01-28 NOTE — Progress Notes (Signed)
Pt discharged to home. D/c instructions given. No questions verbalized. Vitals stable. 

## 2013-01-29 NOTE — Progress Notes (Signed)
   CARE MANAGEMENT NOTE 01/29/2013  Patient:  [name], [name]   Account Number:  [id number]  Date Initiated:  01/25/2013  Documentation initiated by:  Vance Peper  Subjective/Objective Assessment:   62 yr old female s/p left total hip arthroplasty.     Action/Plan:   CM spoke with patient and daughter concerning home health and DME needs at discharge.Patient preoperatively setup with [facility], no changes.  Patient  needs a rollator walker.   Anticipated DC Date:  01/28/2013   Anticipated DC Plan:  HOME W HOME HEALTH SERVICES      DC Planning Services  CM consult      PAC Choice  DURABLE MEDICAL EQUIPMENT  HOME HEALTH   Choice offered to / List presented to:  C-1 Patient   DME arranged  Dan Humphreys      DME agency  [facility]     HH arranged  HH-2 PT      [facility] agency  [facility]   Status of service:  Completed, signed off Medicare Important Message given?   (If response is "NO", the following Medicare IM given date fields will be blank) Date Medicare IM given:   Date Additional Medicare IM given:    Discharge Disposition:  HOME W HOME HEALTH SERVICES  Per UR Regulation:    If discussed at Long Length of Stay Meetings, dates discussed:    Comments:  01/29/13 14:15 CM received call from [place] inquiring disposition of pt.  CM reports pt was discharged yesterday, 01/28/13 without call from pt's RN to inform of discharge, (i.e. F2F) not checked by CM and not completed.  [facility] on unit at this hour and states she will fax necessary info to [place] intake for [facility].  No other CM needs were communicated.  Pt will receive HHPT/OT as ordered.  Freddy Jaksch, BSN, [place] [phone].  01/27/13 12:24pm Vance Peper, RN BSN Patient does not want to go to SNF. Will go home with home health and family support.   01/26/13 Vance Peper, RN BSN Case Manager Physical therapistt states patient cannot discharge to home, she needs treatment at SNF. Social worker has been  notified.

## 2013-01-30 ENCOUNTER — Inpatient Hospital Stay (HOSPITAL_COMMUNITY): Payer: Medicare Other

## 2013-01-30 ENCOUNTER — Emergency Department (HOSPITAL_COMMUNITY): Payer: Medicare Other

## 2013-01-30 ENCOUNTER — Inpatient Hospital Stay (HOSPITAL_COMMUNITY)
Admission: EM | Admit: 2013-01-30 | Discharge: 2013-02-03 | DRG: 390 | Disposition: A | Discharge status: Home Health | Payer: Medicare Other | Attending: Internal Medicine | Admitting: Internal Medicine

## 2013-01-30 ENCOUNTER — Encounter (HOSPITAL_COMMUNITY): Payer: Self-pay | Admitting: Emergency Medicine

## 2013-01-30 DIAGNOSIS — T398X5A Adverse effect of other nonopioid analgesics and antipyretics, not elsewhere classified, initial encounter: Secondary | ICD-10-CM | POA: Diagnosis present

## 2013-01-30 DIAGNOSIS — Z96649 Presence of unspecified artificial hip joint: Secondary | ICD-10-CM

## 2013-01-30 DIAGNOSIS — Z720 Tobacco use: Secondary | ICD-10-CM | POA: Diagnosis present

## 2013-01-30 DIAGNOSIS — K56609 Unspecified intestinal obstruction, unspecified as to partial versus complete obstruction: Principal | ICD-10-CM

## 2013-01-30 DIAGNOSIS — F17200 Nicotine dependence, unspecified, uncomplicated: Secondary | ICD-10-CM

## 2013-01-30 DIAGNOSIS — M169 Osteoarthritis of hip, unspecified: Secondary | ICD-10-CM | POA: Diagnosis present

## 2013-01-30 DIAGNOSIS — Z7982 Long term (current) use of aspirin: Secondary | ICD-10-CM

## 2013-01-30 DIAGNOSIS — B1920 Unspecified viral hepatitis C without hepatic coma: Secondary | ICD-10-CM | POA: Diagnosis present

## 2013-01-30 DIAGNOSIS — M1610 Unilateral primary osteoarthritis, unspecified hip: Secondary | ICD-10-CM

## 2013-01-30 HISTORY — DX: Unspecified viral hepatitis C without hepatic coma: B19.20

## 2013-01-30 HISTORY — DX: Unspecified chronic bronchitis: J42

## 2013-01-30 LAB — COMPREHENSIVE METABOLIC PANEL
Alkaline Phosphatase: 78 U/L (ref 39–117)
BUN: 11 mg/dL (ref 6–23)
CO2: 29 mEq/L (ref 19–32)
Chloride: 99 mEq/L (ref 96–112)
GFR calc Af Amer: >90 mL/min (ref >90)
Glucose, Bld: 121 mg/dL — ABNORMAL HIGH (ref 70–99)
Potassium: 4.2 mEq/L (ref 3.5–5.1)
Total Bilirubin: 0.8 mg/dL (ref 0.3–1.2)
Total Protein: 7.7 g/dL (ref 6.0–8.3)

## 2013-01-30 LAB — CBC WITH DIFFERENTIAL/PLATELET
Eosinophils Absolute: 0.1 10*3/uL (ref 0.0–0.7)
HCT: 34.5 % — ABNORMAL LOW (ref 36.0–46.0)
Hemoglobin: 11.7 g/dL — ABNORMAL LOW (ref 12.0–15.0)
Lymphs Abs: 1.3 10*3/uL (ref 0.7–4.0)
MCH: 31.1 pg (ref 26.0–34.0)
Monocytes Absolute: 0.8 10*3/uL (ref 0.1–1.0)
Monocytes Relative: 9 % (ref 3–12)
Neutrophils Relative %: 75 % (ref 43–77)
Platelets: 302 10*3/uL (ref 150–400)
RBC: 3.76 MIL/uL — ABNORMAL LOW (ref 3.87–5.11)

## 2013-01-30 LAB — URINALYSIS, ROUTINE W REFLEX MICROSCOPIC
Bilirubin Urine: NEGATIVE
Ketones, ur: 15 mg/dL — AB
Nitrite: NEGATIVE
Urobilinogen, UA: 2 mg/dL — ABNORMAL HIGH (ref 0.0–1.0)

## 2013-01-30 LAB — CG4 I-STAT (LACTIC ACID): Lactic Acid, Venous: 1.29 mmol/L (ref 0.5–2.2)

## 2013-01-30 LAB — LIPASE, BLOOD: Lipase: 14 U/L (ref 11–59)

## 2013-01-30 MED ORDER — LIDOCAINE VISCOUS 2 % MT SOLN
15.0000 mL | Freq: Once | OROMUCOSAL | Status: AC
  Administered 2013-01-30: 15 mL via OROMUCOSAL
  Filled 2013-01-30: qty 15

## 2013-01-30 MED ORDER — DEXTROSE 5 % IV SOLN
500.0000 mg | Freq: Four times a day (QID) | INTRAVENOUS | Status: DC | PRN
  Filled 2013-01-30: qty 5

## 2013-01-30 MED ORDER — ACETAMINOPHEN 650 MG RE SUPP: 650.0000 mg | Freq: Four times a day (QID) | RECTAL | Status: DC | PRN

## 2013-01-30 MED ORDER — IOHEXOL 300 MG/ML  SOLN
25.0000 mL | INTRAMUSCULAR | Status: AC
  Administered 2013-01-30: 25 mL via ORAL

## 2013-01-30 MED ORDER — ENOXAPARIN SODIUM 40 MG/0.4ML ~~LOC~~ SOLN
40.0000 mg | SUBCUTANEOUS | Status: DC
  Administered 2013-01-30 – 2013-02-02 (×4): 40 mg via SUBCUTANEOUS
  Filled 2013-01-30 (×5): qty 0.4

## 2013-01-30 MED ORDER — NICOTINE 21 MG/24HR TD PT24
21.0000 mg | MEDICATED_PATCH | TRANSDERMAL | Status: DC
  Administered 2013-01-30 – 2013-02-03 (×5): 21 mg via TRANSDERMAL
  Filled 2013-01-30 (×5): qty 1

## 2013-01-30 MED ORDER — ACETAMINOPHEN 325 MG PO TABS: 650.0000 mg | ORAL_TABLET | Freq: Four times a day (QID) | ORAL | Status: DC | PRN

## 2013-01-30 MED ORDER — HYDROMORPHONE HCL PF 1 MG/ML IJ SOLN
1.0000 mg | Freq: Once | INTRAMUSCULAR | Status: AC
  Administered 2013-01-30: 1 mg via INTRAVENOUS
  Filled 2013-01-30: qty 1

## 2013-01-30 MED ORDER — IOHEXOL 300 MG/ML  SOLN
80.0000 mL | Freq: Once | INTRAMUSCULAR | Status: AC | PRN
  Administered 2013-01-30: 80 mL via INTRAVENOUS

## 2013-01-30 MED ORDER — METOCLOPRAMIDE HCL 5 MG/ML IJ SOLN
10.0000 mg | Freq: Four times a day (QID) | INTRAMUSCULAR | Status: DC | PRN
  Administered 2013-01-30 – 2013-01-31 (×3): 10 mg via INTRAVENOUS
  Filled 2013-01-30 (×3): qty 2

## 2013-01-30 MED ORDER — PANTOPRAZOLE SODIUM 40 MG IV SOLR
40.0000 mg | INTRAVENOUS | Status: DC
  Administered 2013-01-30 – 2013-01-31 (×2): 40 mg via INTRAVENOUS
  Filled 2013-01-30 (×3): qty 40

## 2013-01-30 MED ORDER — KETOROLAC TROMETHAMINE 15 MG/ML IJ SOLN
15.0000 mg | Freq: Four times a day (QID) | INTRAMUSCULAR | Status: DC | PRN
  Administered 2013-01-30 – 2013-02-03 (×10): 15 mg via INTRAVENOUS
  Filled 2013-01-30 (×10): qty 1

## 2013-01-30 MED ORDER — NICOTINE 21 MG/24HR TD PT24: 21.0000 mg | MEDICATED_PATCH | Freq: Every day | TRANSDERMAL | Status: DC

## 2013-01-30 MED ORDER — ONDANSETRON HCL 4 MG/2ML IJ SOLN
4.0000 mg | Freq: Once | INTRAMUSCULAR | Status: AC
  Administered 2013-01-30: 4 mg via INTRAVENOUS
  Filled 2013-01-30: qty 2

## 2013-01-30 MED ORDER — SODIUM CHLORIDE 0.9 % IV SOLN
INTRAVENOUS | Status: DC
  Administered 2013-01-30 – 2013-02-01 (×4): via INTRAVENOUS

## 2013-01-30 MED ORDER — LIDOCAINE HCL (CARDIAC) 20 MG/ML IV SOLN
INTRAVENOUS | Status: AC
  Filled 2013-01-30: qty 5

## 2013-01-30 MED ORDER — KETOROLAC TROMETHAMINE 30 MG/ML IJ SOLN
30.0000 mg | Freq: Once | INTRAMUSCULAR | Status: AC
  Administered 2013-01-30: 30 mg via INTRAVENOUS
  Filled 2013-01-30: qty 1

## 2013-01-30 MED ORDER — SODIUM CHLORIDE 0.9 % IV BOLUS (SEPSIS)
1000.0000 mL | Freq: Once | INTRAVENOUS | Status: AC
Start: 2013-01-30 — End: 2013-01-30
  Administered 2013-01-30: 1000 mL via INTRAVENOUS

## 2013-01-30 NOTE — ED Notes (Signed)
Pt returned from radiology, vomited on herself. sts they made her drink the CT contrast too fast. Pt reports she is still having some abd cramping, now it is coming and going not constant.

## 2013-01-30 NOTE — ED Notes (Signed)
Pharmacy tech at bedside 

## 2013-01-30 NOTE — ED Notes (Signed)
Registration at bedside.

## 2013-01-30 NOTE — ED Notes (Signed)
Pt is here with lower and upper abdominal pain since Thursday, and patient had left hip surgery on Wednesday.  Pt has been vomiting.  LBM last Sunday.  Pt has been taking Miralax.  Pt states not passing gas.

## 2013-01-30 NOTE — Consult Note (Signed)
Unassigned Patient  Reason for Consult: SBO Referring Physician: [facility]  Melany Guernsey HPI: This is a 62 year old female who was recently discharged from the hospital for a left hip arthroplasty on 01/25/2013.  The surgery was performed without any issues, but post operatively she did mention that she had some abdominal pain.  She was treated symptomatically and then discharged home on narcotics.  Subsequently her abdominal pain continued to worsen and she was not able to pass flatus or have any bowel movements.  Nausea and vomiting ensued yesterday.  A CT scan was performed and it was significant for an SBO with the transition point in the pelvis.  A GI consult was requested for further management.  There is no history of any abdominal surgery.  Past Medical History  Diagnosis Date  . Chronic bronchitis     "when I get a cold I normally get bronchitis" (01/30/2013)  . Hepatitis C     Past Surgical History  Procedure Laterality Date  . Back surgery      x5  . Colonoscopy    . Joint replacement Left 01/24/2013    hip  . Total hip arthroplasty Left 01/24/2013    Procedure: LEFT TOTAL HIP ARTHROPLASTY ANTERIOR APPROACH;  Surgeon: Kathryne Hitch, MD;  Location: [facility] OR;  Service: Orthopedics;  Laterality: Left;  . Posterior lumbar fusion  2006 X 2; 2008  . Lumbar spine surgery  2010; 2011    "tightened up some screws" (01/30/2013)  . Abdominal hysterectomy  1990's  . Tubal ligation  1970's  . Cholecystectomy  1990's    History reviewed. No pertinent family history.  Social History:  reports that she has been smoking Cigarettes.  She has a 45 pack-year smoking history. She has never used smokeless tobacco. She reports that she drinks alcohol. She reports that she does not use illicit drugs.  Allergies: No Known Allergies  Medications:  Scheduled: . enoxaparin (LOVENOX) injection  40 mg Subcutaneous Q24H  . nicotine  21 mg Transdermal Q24H  . pantoprazole  (PROTONIX) IV  40 mg Intravenous Q24H   Continuous: . sodium chloride 100 mL/hr at 01/30/13 1740    Results for orders placed during the hospital encounter of 01/30/13 (from the past 24 hour(s))  CBC WITH DIFFERENTIAL     Status: Abnormal   Collection Time    01/30/13  7:45 AM      Result Value Range   WBC 9.0  4.0 - 10.5 K/uL   RBC 3.76 (*) 3.87 - 5.11 MIL/uL   Hemoglobin 11.7 (*) 12.0 - 15.0 g/dL   HCT 16.1 (*) 09.6 - 04.5 %   MCV 91.8  78.0 - 100.0 fL   MCH 31.1  26.0 - 34.0 pg   MCHC 33.9  30.0 - 36.0 g/dL   RDW 40.9  81.1 - 91.4 %   Platelets 302  150 - 400 K/uL   Neutrophils Relative % 75  43 - 77 %   Neutro Abs 6.8  1.7 - 7.7 K/uL   Lymphocytes Relative 15  12 - 46 %   Lymphs Abs 1.3  0.7 - 4.0 K/uL   Monocytes Relative 9  3 - 12 %   Monocytes Absolute 0.8  0.1 - 1.0 K/uL   Eosinophils Relative 1  0 - 5 %   Eosinophils Absolute 0.1  0.0 - 0.7 K/uL   Basophils Relative 0  0 - 1 %   Basophils Absolute 0.0  0.0 - 0.1 K/uL  COMPREHENSIVE METABOLIC PANEL     Status: Abnormal   Collection Time    01/30/13  7:45 AM      Result Value Range   Sodium 141  135 - 145 mEq/L   Potassium 4.2  3.5 - 5.1 mEq/L   Chloride 99  96 - 112 mEq/L   CO2 29  19 - 32 mEq/L   Glucose, Bld 121 (*) 70 - 99 mg/dL   BUN 11  6 - 23 mg/dL   Creatinine, Ser 9.60  0.50 - 1.10 mg/dL   Calcium 9.7  8.4 - 45.4 mg/dL   Total Protein 7.7  6.0 - 8.3 g/dL   Albumin 3.3 (*) 3.5 - 5.2 g/dL   AST 34  0 - 37 U/L   ALT 26  0 - 35 U/L   Alkaline Phosphatase 78  39 - 117 U/L   Total Bilirubin 0.8  0.3 - 1.2 mg/dL   GFR calc non Af Amer >90  >90 mL/min   GFR calc Af Amer >90  >90 mL/min  LIPASE, BLOOD     Status: None   Collection Time    01/30/13  7:45 AM      Result Value Range   Lipase 14  11 - 59 U/L  POCT I-STAT TROPONIN I     Status: None   Collection Time    01/30/13  8:24 AM      Result Value Range   Troponin i, poc 0.00  0.00 - 0.08 ng/mL   Comment 3           CG4 I-STAT (LACTIC ACID)      Status: None   Collection Time    01/30/13  8:32 AM      Result Value Range   Lactic Acid, Venous 1.29  0.5 - 2.2 mmol/L  URINALYSIS, ROUTINE W REFLEX MICROSCOPIC     Status: Abnormal   Collection Time    01/30/13  9:05 AM      Result Value Range   Color, Urine YELLOW  YELLOW   APPearance CLEAR  CLEAR   Specific Gravity, Urine 1.018  1.005 - 1.030   pH 7.5  5.0 - 8.0   Glucose, UA NEGATIVE  NEGATIVE mg/dL   Hgb urine dipstick NEGATIVE  NEGATIVE   Bilirubin Urine NEGATIVE  NEGATIVE   Ketones, ur 15 (*) NEGATIVE mg/dL   Protein, ur NEGATIVE  NEGATIVE mg/dL   Urobilinogen, UA 2.0 (*) 0.0 - 1.0 mg/dL   Nitrite NEGATIVE  NEGATIVE   Leukocytes, UA NEGATIVE  NEGATIVE     Ct Abdomen Pelvis W Contrast  01/30/2013   CLINICAL DATA:  Lower abdominal pain  EXAM: CT ABDOMEN AND PELVIS WITH CONTRAST  TECHNIQUE: Multidetector CT imaging of the abdomen and pelvis was performed using the standard protocol following bolus administration of intravenous contrast.  CONTRAST:  80mL OMNIPAQUE IOHEXOL 300 MG/ML  SOLN  COMPARISON:  None.  FINDINGS: The lung bases unremarkable.  The liver, spleen, adrenals, pancreas, kidneys are unremarkable. [name].  Multiple dilated loops of small bowel appreciated scattered throughout the abdomen and pelvis. These loops of bowel contain air, fluid, dilute contrast, and nondilated contrast. There are findings which may represent a transition point within the mid to lower pelvis on image 60 series 2. There decompressed loops of large bowel within the abdomen. There is no evidence of appreciable free air. There is no evidence of loculated fluid collections nor significant free fluid within the abdomen or pelvis.  There is no evidence of abdominal aortic aneurysm. Atherosclerotic calcifications identified within the aorta and iliac vessels.  The celiac, SMA, IMA, portal vein, SMV are opacified.  Evaluation pelvis is degraded by metallic beam hardening  from a left hip prostheses.  IMPRESSION: Small bowel obstruction as described above what appears to be a transition point in the pelvis. Atherosclerotic disease within the aorta. Postsurgical changes identified within the thoracic and lumbar spine.   Electronically Signed   By: Salome Holmes M.D.   On: 01/30/2013 12:14   Dg Abd Portable 1v  01/30/2013   CLINICAL DATA:  Small-bowel obstruction  EXAM: PORTABLE ABDOMEN - 1 VIEW  COMPARISON:  01/30/2013  FINDINGS: Dilated small bowel is again identified. The overall appearance is similar to that seen on the recent CT examination. No free air is noted. A nasogastric catheter is seen within the stomach although the proximal side port lies within the distal esophagus. Contrast from recent CT is noted. Postsurgical changes are again seen.  IMPRESSION: Persistent small bowel dilatation.   Electronically Signed   By: Alcide Clever M.D.   On: 01/30/2013 17:05    ROS:  As stated above in the HPI otherwise negative.  Blood pressure 144/76, pulse 84, temperature 98.6 F (37 C), temperature source Oral, resp. rate 16, SpO2 95.00%.    PE: Gen: Sleepy - Improved abdominal pain with the NG tube. HEENT:  [place]/AT, EOMI Neck: Supple, no LAD Lungs: CTA Bilaterally CV: RRR without M/G/R ABM: Soft, NTND, +BS Ext: No C/C/E  Assessment/Plan: 1) SBO. 2) Left hip arthroplasty.   I do not know the source of the SBO.  She denies any prior abdominal surgeries or any severe intra-abdominal infections.    Plan: 1) Continue with the NG tube. 2) Surgery consult if the patient does not progress.  Natsuko Kelsay D 01/30/2013, 6:32 PM

## 2013-01-30 NOTE — ED Notes (Addendum)
Pt finished drinking oral CT contrast. Notified CT. 

## 2013-01-30 NOTE — Progress Notes (Signed)
Pt admitted from E.D. With SBO. Recently discharged with left total hip (done last Tuesday) Dressing still in place, clean, dry, intact. NGT placed in E.D. No complaints of pain at this time. Oriented to room and call bell system. IV to RAC NS @ 100. Non tele, will continue to monitor. [place], [employer]

## 2013-01-30 NOTE — ED Notes (Addendum)
Pt c/o abd pain that started following a hip sx. Reports she is unable to have a BM and can't keep anything down. sts she is unable to keep her meds down also. Has tried multiple OTC meds - Miralx, Pepto bismol, Mylanta. Nad, skin warm and dry, resp e/u.

## 2013-01-30 NOTE — ED Notes (Signed)
NG tube place by [place] Grose and Mayra Reel

## 2013-01-30 NOTE — ED Notes (Signed)
Attempted to insert NG tube, unsuccessful. Pt kept pulling tube out, coughing and sneezing. Pt's daughter pulled on my hand to pull tube out, saying "this is just too painful, you need to put her to sleep". Pt and family informed that this doesn't require a pt being put to sleep. Another RN will attempt to put NG tube down.

## 2013-01-30 NOTE — H&P (Signed)
[facility] History and Physical  [name] XBJ:[id number] DOB: 08-18-1950 DOA: 01/30/2013  Referring physician: ED PCP: Lucilla Edin, MD   Chief Complaint:  Abdominal pain since 4 days  HPI:  62 year old female with history of chronic back pain with multiple back surgery, active smoker degenerative arthritis of left hip who underwent left total hip arthroplasty done on 12/17 and was discharged home on 12/20 (2 days back), returned to the ED today with severe abdominal pain mainly l over infraumbilical area. Patient reports that she  started having pain over the abdomen one day after her surgery and had mentioned about this. She was given Mylanta and GI cocktail for the symptoms. She reports that she has not had a bowel movement since the surgery which was 60s back. Since he was discharged home she has been taking 2-3 tablets of oxycodone for her hip pain. She has been having worsened pain in her abdomen and has not passed gas since the last 2 days. She denies any fever or chills but did have several episodes of nausea and vomiting since yesterday. She denies any chest pain, shortness of breath, palpitations, dysuria or hematuria.. she denies similar symptoms in the past.  Course in the ED His vitals were stable. Blood will done was unremarkable. Lipase was normal. A CT scan of the abdomen and pelvis was done which showed small bowel obstruction with transition point or to her pelvis. An NG tube was placed and [facility] consulted for admission to medical floor. GI Dr. Eliot Ford was consulted by the ED as well.  Review of Systems:  Constitutional: Denies fever, chills, diaphoresis, appetite change and fatigue.  HEENT: Denies photophobia, eye pain, redness, hearing loss, ear pain, congestion, sore throat, rhinorrhea, sneezing, mouth sores, trouble swallowing, neck pain, neck stiffness and tinnitus.   Respiratory: Denies SOB, DOE, cough, chest tightness,  and wheezing.    Cardiovascular: Denies chest pain, palpitations and leg swelling.  Gastrointestinal:nausea, vomiting, abdominal pain, constipation, abdominal distention, denies diarrhea,  blood in stool Genitourinary: Denies dysuria, urgency, frequency, hematuria, flank pain and difficulty urinating.  Endocrine: Denies  polyuria, polydipsia. Musculoskeletal: Chronic back pain, Denies myalgias,  joint swelling, arthralgias and gait problem.  Skin: Denies pallor, rash and wound.  Neurological: Denies dizziness, seizures, syncope, weakness, light-headedness, numbness and headaches.  Hematological: Denies adenopathy. Easy bruising, personal or family bleeding history  Psychiatric/Behavioral: Denies suicidal ideation, mood changes, confusion, nervousness, sleep disturbance and agitation   Past Medical History  Diagnosis Date  . Hepatitis     "c"  . Hx of bronchitis    Past Surgical History  Procedure Laterality Date  . Abdominal hysterectomy    . Cholecystectomy    . Back surgery      x5  . Colonoscopy    . Joint replacement Left 01/24/2013    hip  . Total hip arthroplasty Left 01/24/2013    Procedure: LEFT TOTAL HIP ARTHROPLASTY ANTERIOR APPROACH;  Surgeon: Kathryne Hitch, MD;  Location[facility] OR;  Service: Orthopedics;  Laterality: Left;   Social History:  reports that she has been smoking Cigarettes.  She has a 25 pack-year smoking history. She has never used smokeless tobacco. She reports that she does not drink alcohol or use illicit drugs.  No Known Allergies  No family history on file.  Prior to Admission medications   Medication Sig Start Date End Date Taking? Authorizing Provider  aspirin 325 MG tablet Take 325 mg by mouth daily.   Yes Historical  Provider, MD  aspirin-sod bicarb-citric acid (ALKA-SELTZER) 325 MG TBEF tablet Take 325 mg by mouth daily as needed (indigestion).   Yes Historical Provider, MD  bismuth subsalicylate (PEPTO BISMOL) 262 MG/15ML suspension Take 30 mLs by  mouth every 6 (six) hours as needed for indigestion or diarrhea or loose stools.   Yes Historical Provider, MD  calcium carbonate (TUMS - DOSED IN MG ELEMENTAL CALCIUM) 500 MG chewable tablet Chew 2 tablets by mouth daily as needed for indigestion or heartburn.   Yes Historical Provider, MD  oxyCODONE-acetaminophen (ROXICET) 5-325 MG per tablet Take 1-2 tablets by mouth every 4 (four) hours as needed for severe pain. 01/27/13  Yes Richardean Canal, PA-C  polyethylene glycol (MIRALAX / GLYCOLAX) packet Take 17 g by mouth daily.   Yes Historical Provider, MD    Physical Exam:  Filed Vitals:   01/30/13 1300 01/30/13 1330 01/30/13 1506 01/30/13 1610  BP:  127/58 114/69 144/76  Pulse: 87 76  84  Temp:   99.5 F (37.5 C) 98.6 F (37 C)  TempSrc:   Oral Oral  Resp: 25 16 15 16   SpO2: 98% 97% 98% 95%    Constitutional: Vital signs reviewed.  Elderly female lying in bed in no acute distress, HEENT: No pallor, no icterus, NG in place, dry oral mucosa Chest: Clear to auscultation bilaterally, no added sound CVS: Normal S1 and S2, no murmur or gallop Abdomen: Soft, nondistended, tender to palpation over the infra-umbilical area, owel sounds present Extremities: Warm, no edema CNS: AAO x3   Labs on Admission:  Basic Metabolic Panel:  Recent Labs Lab 01/25/13 0410 01/30/13 0745  NA 136 141  K 4.0 4.2  CL 104 99  CO2 24 29  GLUCOSE 116* 121*  BUN 12 11  CREATININE 0.57 0.53  CALCIUM 8.4 9.7   Liver Function Tests:  Recent Labs Lab 01/30/13 0745  AST 34  ALT 26  ALKPHOS 78  BILITOT 0.8  PROT 7.7  ALBUMIN 3.3*    Recent Labs Lab 01/30/13 0745  LIPASE 14   No results found for this basename: AMMONIA,  in the last 168 hours CBC:  Recent Labs Lab 01/25/13 0410 01/26/13 0505 01/27/13 0548 01/30/13 0745  WBC 9.3 12.4* 12.2* 9.0  NEUTROABS  --   --   --  6.8  HGB 11.4* 11.0* 10.6* 11.7*  HCT 32.9* 32.8* 31.4* 34.5*  MCV 91.4 92.7 92.1 91.8  PLT 207 179 191 302    Cardiac Enzymes: No results found for this basename: CKTOTAL, CKMB, CKMBINDEX, TROPONINI,  in the last 168 hours BNP: No components found with this basename: POCBNP,  CBG: No results found for this basename: GLUCAP,  in the last 168 hours  Radiological Exams on Admission: Ct Abdomen Pelvis W Contrast  01/30/2013   CLINICAL DATA:  Lower abdominal pain  EXAM: CT ABDOMEN AND PELVIS WITH CONTRAST  TECHNIQUE: Multidetector CT imaging of the abdomen and pelvis was performed using the standard protocol following bolus administration of intravenous contrast.  CONTRAST:  80mL OMNIPAQUE IOHEXOL 300 MG/ML  SOLN  COMPARISON:  None.  FINDINGS: The lung bases unremarkable.  The liver, spleen, adrenals, pancreas, kidneys are unremarkable. [name].  Multiple dilated loops of small bowel appreciated scattered throughout the abdomen and pelvis. These loops of bowel contain air, fluid, dilute contrast, and nondilated contrast. There are findings which may represent a transition point within the mid to lower pelvis on image 60 series 2. There decompressed loops of large  bowel within the abdomen. There is no evidence of appreciable free air. There is no evidence of loculated fluid collections nor significant free fluid within the abdomen or pelvis.  There is no evidence of abdominal aortic aneurysm. Atherosclerotic calcifications identified within the aorta and iliac vessels.  The celiac, SMA, IMA, portal vein, SMV are opacified.  Evaluation pelvis is degraded by metallic beam hardening from a left hip prostheses.  IMPRESSION: Small bowel obstruction as described above what appears to be a transition point in the pelvis. Atherosclerotic disease within the aorta. Postsurgical changes identified within the thoracic and lumbar spine.   Electronically Signed   By: Salome Holmes M.D.   On: 01/30/2013 12:14      Assessment/Plan Principal Problem:   SBO (small bowel obstruction) Likely  triggered by progression of postop ileus and symptoms worsened in the setting of narcotics. Admit to med surg -serial abdominal exam. abd x ray in am -NPO .NG to low suction. abdominal pain much improved after NG placed. Prn reglan for nausea/ vomiting Pain control with IV toradol prn and IV robaxin for muscle spasms. Avoiding narcotics -follow GI recommendations   Active Problems:   Degenerative arthritis of left hip   Status post THR (total hip replacement) 5 days back. Pain control with when necessary Toradol and Robaxin. Please consult Dr. Magnus Ivan if any issues. Since patient is on full dose aspirin for DVT prophylaxis and is currently n.p.o. I will place her on subcutaneous Lovenox 40 mg daily for DVT prophylaxis.    Tobacco abuse Counseled on smoking cessation. Will order nicotine patch.  DVT prophylaxis: Subcutaneous Lovenox   Code Status: Full code Family Communication: Daughter At bedside Disposition Plan: Home once improved  Eddie North [facility] Pager [phone]  If 7PM-7AM, please contact night-coverage [email or web address] Password [facility] 01/30/2013, 4:13 PM   Total Time spent: 70 minutes

## 2013-01-30 NOTE — ED Provider Notes (Signed)
CSN: [id number]     Arrival date & time 01/30/13  4098 History   First MD Initiated Contact with Patient 01/30/13 [phone]     Chief Complaint  Patient presents with  . Abdominal Pain  . Emesis   (Consider location/radiation/quality/duration/timing/severity/associated sxs/prior Treatment) The history is provided by the patient. No language interpreter was used.  [name] is a 62 year old female with past history of hepatitis, cholecystectomy, total hip arthroplasty that was performed on 01/24/2013 by Dr. Magnus Ivan to the left hip presenting to emergency department with abdominal pain that started on 01/25/2013. Patient reports the abdominal pain is all over the abdomen, constant pain that does not radiate. Stated that she's noticed distention of her abdomen over the past couple of days. Patient reports she is unable to keep any food or fluids down. Reports she has not had a bowel movement since the Sunday before her surgery. Reported that she is unable to pass any flatulence. Patient reports she has been burping. Stated that she's noticed decreased in urine. Patient reports she has not been taking anything for the discomfort secondary to not being able to keep anything down. Denied chest pain, shortness of breath, difficulty breathing, neck pain, back pain, dysuria, vaginal complaints, fever, chills. PCP Dr. Cleta Alberts  Past Medical History  Diagnosis Date  . Chronic bronchitis     "when I get a cold I normally get bronchitis" (01/30/2013)  . Hepatitis C    Past Surgical History  Procedure Laterality Date  . Back surgery      x5  . Colonoscopy    . Joint replacement Left 01/24/2013    hip  . Total hip arthroplasty Left 01/24/2013    Procedure: LEFT TOTAL HIP ARTHROPLASTY ANTERIOR APPROACH;  Surgeon: Kathryne Hitch, MD;  Location: [facility] OR;  Service: Orthopedics;  Laterality: Left;  . Posterior lumbar fusion  2006 X 2; 2008  . Lumbar spine surgery  2010; 2011    "tightened up some  screws" (01/30/2013)  . Abdominal hysterectomy  1990's  . Tubal ligation  1970's  . Cholecystectomy  1990's   History reviewed. No pertinent family history. History  Substance Use Topics  . Smoking status: Current Every Day Smoker -- 1.00 packs/day for 45 years    Types: Cigarettes  . Smokeless tobacco: Never Used  . Alcohol Use: Yes     Comment: 01/30/2013 "used to drink beer; none since ~ 1989"   OB History   Grav Para Term Preterm Abortions TAB SAB Ect Mult Living                 Review of Systems  Constitutional: Negative for fever and chills.  HENT: Negative for trouble swallowing.   Respiratory: Negative for chest tightness and shortness of breath.   Cardiovascular: Negative for chest pain.  Gastrointestinal: Positive for nausea, vomiting, abdominal pain and constipation. Negative for diarrhea.  Genitourinary: Negative for dysuria, hematuria, vaginal bleeding, vaginal discharge and vaginal pain.  Musculoskeletal: Negative for back pain and neck pain.  Neurological: Negative for dizziness and weakness.  All other systems reviewed and are negative.    Allergies  Review of patient's allergies indicates no known allergies.  Home Medications   No current outpatient prescriptions on file. BP 144/76  Pulse 84  Temp(Src) 98.6 F (37 C) (Oral)  Resp 16  SpO2 95% Physical Exam  Nursing note and vitals reviewed. Constitutional: She is oriented to person, place, and time. She appears well-developed and well-nourished. No distress.  HENT:  Head: Normocephalic and atraumatic.  Mouth/Throat: No oropharyngeal exudate.  Dry mucous membranes  Eyes: Conjunctivae and EOM are normal. Pupils are equal, round, and reactive to light. Right eye exhibits no discharge. Left eye exhibits no discharge.  Neck: Normal range of motion. Neck supple.  Cardiovascular: Normal rate and regular rhythm.   No murmur heard. Pulses:      Radial pulses are 2+ on the right side, and 2+ on the left  side.       Dorsalis pedis pulses are 2+ on the right side, and 2+ on the left side.  Pulmonary/Chest: Effort normal and breath sounds normal. No respiratory distress. She has no wheezes. She has no rales.  Abdominal: Soft. There is generalized tenderness. There is no guarding, no tenderness at McBurney's point and negative Smylie's sign.  Abdominal distention Decreased bowel sounds all 4 quadrants Diffuse tenderness upon palpation to the abdomen  Negative fluid wave or ascites identified  Musculoskeletal: Normal range of motion.  Full ROM to upper and lower extremities bilaterally without difficulty noted  Lymphadenopathy:    She has no cervical adenopathy.  Neurological: She is alert and oriented to person, place, and time. She exhibits normal muscle tone. Coordination normal.  Skin: Skin is warm and dry. No rash noted. She is not diaphoretic. No erythema.  Psychiatric: She has a normal mood and affect. Her behavior is normal.    ED Course  Procedures (including critical care time)  12:08 PM This provider reassess patient. Daughter reported that patient has had an episode of emesis shortly prior to this provider arriving into the room. Brown liquid consistency of emesis. Patient denies abdominal pain at this point in time, reported that episode of emesis made her abdominal pain feel better.  1:04 PM This provider spoke with nurse, Sunny Schlein, Dr. Haywood Pao nurse - reported that Dr. Elnoria Howard is in a procedure. Discussed case, labs, imaging and results - discussed that patient is to be admitted. Physician agreed to consult.   1:28 PM This provider spoke with Dr. Elnoria Howard, GI. Discussed case, history, presentation, labs and imaging. GI to consult.   1:50 PM Family medicine not admitting [place] patients.   2:14 PM NG tube attempted by RN - was unsuccessful. Patient continue to sneeze and cough, RN reported that patient's granddaughter kept pulling the tube out of nurse's hand - granddaughter requesting  that the patient be put to sleep. This provider had a discussion with the family. Granddaughter upset because the NG tube was hurting her grandmother - this provider explained that this procedure is uncomfortable, but needs to be done because the main way to treat SBO. Reported that another RN will perform the procedure - patient agreed. More pain medications administered. NG tube attempted again and successful.    Discussed case with Dr. Gonzella Lex, Internal Medicine. Discussed case, history , presentation, labs and imaging. Patient to be admitted to MedSurg for SBO.   Results for orders placed during the hospital encounter of 01/30/13  CBC WITH DIFFERENTIAL      Result Value Range   WBC 9.0  4.0 - 10.5 K/uL   RBC 3.76 (*) 3.87 - 5.11 MIL/uL   Hemoglobin 11.7 (*) 12.0 - 15.0 g/dL   HCT 16.1 (*) 09.6 - 04.5 %   MCV 91.8  78.0 - 100.0 fL   MCH 31.1  26.0 - 34.0 pg   MCHC 33.9  30.0 - 36.0 g/dL   RDW 40.9  81.1 - 91.4 %   Platelets 302  150 -  400 K/uL   Neutrophils Relative % 75  43 - 77 %   Neutro Abs 6.8  1.7 - 7.7 K/uL   Lymphocytes Relative 15  12 - 46 %   Lymphs Abs 1.3  0.7 - 4.0 K/uL   Monocytes Relative 9  3 - 12 %   Monocytes Absolute 0.8  0.1 - 1.0 K/uL   Eosinophils Relative 1  0 - 5 %   Eosinophils Absolute 0.1  0.0 - 0.7 K/uL   Basophils Relative 0  0 - 1 %   Basophils Absolute 0.0  0.0 - 0.1 K/uL  COMPREHENSIVE METABOLIC PANEL      Result Value Range   Sodium 141  135 - 145 mEq/L   Potassium 4.2  3.5 - 5.1 mEq/L   Chloride 99  96 - 112 mEq/L   CO2 29  19 - 32 mEq/L   Glucose, Bld 121 (*) 70 - 99 mg/dL   BUN 11  6 - 23 mg/dL   Creatinine, Ser 1.47  0.50 - 1.10 mg/dL   Calcium 9.7  8.4 - 82.9 mg/dL   Total Protein 7.7  6.0 - 8.3 g/dL   Albumin 3.3 (*) 3.5 - 5.2 g/dL   AST 34  0 - 37 U/L   ALT 26  0 - 35 U/L   Alkaline Phosphatase 78  39 - 117 U/L   Total Bilirubin 0.8  0.3 - 1.2 mg/dL   GFR calc non Af Amer >90  >90 mL/min   GFR calc Af Amer >90  >90 mL/min  LIPASE,  BLOOD      Result Value Range   Lipase 14  11 - 59 U/L  URINALYSIS, ROUTINE W REFLEX MICROSCOPIC      Result Value Range   Color, Urine YELLOW  YELLOW   APPearance CLEAR  CLEAR   Specific Gravity, Urine 1.018  1.005 - 1.030   pH 7.5  5.0 - 8.0   Glucose, UA NEGATIVE  NEGATIVE mg/dL   Hgb urine dipstick NEGATIVE  NEGATIVE   Bilirubin Urine NEGATIVE  NEGATIVE   Ketones, ur 15 (*) NEGATIVE mg/dL   Protein, ur NEGATIVE  NEGATIVE mg/dL   Urobilinogen, UA 2.0 (*) 0.0 - 1.0 mg/dL   Nitrite NEGATIVE  NEGATIVE   Leukocytes, UA NEGATIVE  NEGATIVE  CG4 I-STAT (LACTIC ACID)      Result Value Range   Lactic Acid, Venous 1.29  0.5 - 2.2 mmol/L  POCT I-STAT TROPONIN I      Result Value Range   Troponin i, poc 0.00  0.00 - 0.08 ng/mL   Comment 3             Ct Abdomen Pelvis W Contrast  01/30/2013   CLINICAL DATA:  Lower abdominal pain  EXAM: CT ABDOMEN AND PELVIS WITH CONTRAST  TECHNIQUE: Multidetector CT imaging of the abdomen and pelvis was performed using the standard protocol following bolus administration of intravenous contrast.  CONTRAST:  80mL OMNIPAQUE IOHEXOL 300 MG/ML  SOLN  COMPARISON:  None.  FINDINGS: The lung bases unremarkable.  The liver, spleen, adrenals, pancreas, kidneys are unremarkable. [name].  Multiple dilated loops of small bowel appreciated scattered throughout the abdomen and pelvis. These loops of bowel contain air, fluid, dilute contrast, and nondilated contrast. There are findings which may represent a transition point within the mid to lower pelvis on image 60 series 2. There decompressed loops of large bowel within the abdomen. There is no evidence of appreciable free  air. There is no evidence of loculated fluid collections nor significant free fluid within the abdomen or pelvis.  There is no evidence of abdominal aortic aneurysm. Atherosclerotic calcifications identified within the aorta and iliac vessels.  The celiac, SMA, IMA, portal  vein, SMV are opacified.  Evaluation pelvis is degraded by metallic beam hardening from a left hip prostheses.  IMPRESSION: Small bowel obstruction as described above what appears to be a transition point in the pelvis. Atherosclerotic disease within the aorta. Postsurgical changes identified within the thoracic and lumbar spine.   Electronically Signed   By: Salome Holmes M.D.   On: 01/30/2013 12:14     Labs Review Labs Reviewed  CBC WITH DIFFERENTIAL - Abnormal; Notable for the following:    RBC 3.76 (*)    Hemoglobin 11.7 (*)    HCT 34.5 (*)    All other components within normal limits  COMPREHENSIVE METABOLIC PANEL - Abnormal; Notable for the following:    Glucose, Bld 121 (*)    Albumin 3.3 (*)    All other components within normal limits  URINALYSIS, ROUTINE W REFLEX MICROSCOPIC - Abnormal; Notable for the following:    Ketones, ur 15 (*)    Urobilinogen, UA 2.0 (*)    All other components within normal limits  LIPASE, BLOOD  BASIC METABOLIC PANEL  CG4 I-STAT (LACTIC ACID)  POCT I-STAT TROPONIN I   Imaging Review Ct Abdomen Pelvis W Contrast  01/30/2013   CLINICAL DATA:  Lower abdominal pain  EXAM: CT ABDOMEN AND PELVIS WITH CONTRAST  TECHNIQUE: Multidetector CT imaging of the abdomen and pelvis was performed using the standard protocol following bolus administration of intravenous contrast.  CONTRAST:  80mL OMNIPAQUE IOHEXOL 300 MG/ML  SOLN  COMPARISON:  None.  FINDINGS: The lung bases unremarkable.  The liver, spleen, adrenals, pancreas, kidneys are unremarkable. [name].  Multiple dilated loops of small bowel appreciated scattered throughout the abdomen and pelvis. These loops of bowel contain air, fluid, dilute contrast, and nondilated contrast. There are findings which may represent a transition point within the mid to lower pelvis on image 60 series 2. There decompressed loops of large bowel within the abdomen. There is no evidence of appreciable  free air. There is no evidence of loculated fluid collections nor significant free fluid within the abdomen or pelvis.  There is no evidence of abdominal aortic aneurysm. Atherosclerotic calcifications identified within the aorta and iliac vessels.  The celiac, SMA, IMA, portal vein, SMV are opacified.  Evaluation pelvis is degraded by metallic beam hardening from a left hip prostheses.  IMPRESSION: Small bowel obstruction as described above what appears to be a transition point in the pelvis. Atherosclerotic disease within the aorta. Postsurgical changes identified within the thoracic and lumbar spine.   Electronically Signed   By: Salome Holmes M.D.   On: 01/30/2013 12:14   Dg Abd Portable 1v  01/30/2013   CLINICAL DATA:  Small-bowel obstruction  EXAM: PORTABLE ABDOMEN - 1 VIEW  COMPARISON:  01/30/2013  FINDINGS: Dilated small bowel is again identified. The overall appearance is similar to that seen on the recent CT examination. No free air is noted. A nasogastric catheter is seen within the stomach although the proximal side port lies within the distal esophagus. Contrast from recent CT is noted. Postsurgical changes are again seen.  IMPRESSION: Persistent small bowel dilatation.   Electronically Signed   By: Alcide Clever M.D.   On: 01/30/2013 17:05    EKG Interpretation  Date/Time:  Monday January 30 2013 08:40:23 EST Ventricular Rate:  82 PR Interval:  127 QRS Duration: 86 QT Interval:  374 QTC Calculation: 437 R Axis:   43 Text Interpretation:  Normal sinus rhythm Normal ECG No significant change since last tracing Confirmed by GOLDSTON  MD, SCOTT ([id number]) on 01/30/2013 8:49:35 AM            MDM   1. Small bowel obstruction   2. Degenerative arthritis of hip   3. Tobacco abuse     Medications  iohexol (OMNIPAQUE) 300 MG/ML solution 25 mL ( Oral Canceled Entry 01/30/13 1115)  enoxaparin (LOVENOX) injection 40 mg (40 mg Subcutaneous Given 01/30/13 1749)  0.9 %  sodium  chloride infusion ( Intravenous New Bag/Given 01/30/13 1740)  acetaminophen (TYLENOL) tablet 650 mg (not administered)    Or  acetaminophen (TYLENOL) suppository 650 mg (not administered)  pantoprazole (PROTONIX) injection 40 mg (40 mg Intravenous Given 01/30/13 1749)  metoCLOPramide (REGLAN) injection 10 mg (10 mg Intravenous Given 01/30/13 1749)  methocarbamol (ROBAXIN) 500 mg in dextrose 5 % 50 mL IVPB (not administered)  ketorolac (TORADOL) 15 MG/ML injection 15 mg (15 mg Intravenous Given 01/30/13 1808)  nicotine (NICODERM CQ - dosed in mg/24 hours) patch 21 mg (21 mg Transdermal Patch Applied 01/30/13 1749)  ondansetron (ZOFRAN) injection 4 mg (4 mg Intravenous Given 01/30/13 0812)  sodium chloride 0.9 % bolus 1,000 mL (0 mLs Intravenous Stopped 01/30/13 1144)  ketorolac (TORADOL) 30 MG/ML injection 30 mg (30 mg Intravenous Given 01/30/13 1043)  iohexol (OMNIPAQUE) 300 MG/ML solution 80 mL (80 mLs Intravenous Contrast Given 01/30/13 1138)  ondansetron (ZOFRAN) injection 4 mg (4 mg Intravenous Given 01/30/13 1216)  lidocaine (XYLOCAINE) 2 % viscous mouth solution 15 mL (15 mLs Mouth/Throat Given 01/30/13 1409)  HYDROmorphone (DILAUDID) injection 1 mg (1 mg Intravenous Given 01/30/13 1430)   Filed Vitals:   01/30/13 1300 01/30/13 1330 01/30/13 1506 01/30/13 1610  BP:  127/58 114/69 144/76  Pulse: 87 76  84  Temp:   99.5 F (37.5 C) 98.6 F (37 C)  TempSrc:   Oral Oral  Resp: 25 16 15 16   SpO2: 98% 97% 98% 95%    Patient presenting to emergency department with abdominal pain, nausea, emesis has been ongoing since 01/25/2013. Patient recently had total left hip arthroplasty performed on 01/24/2013. Alert and oriented. GCS 15. Heart rate and rhythm normal.. Strong, radial 2+ bilaterally. Lungs clear to auscultation to upper and lower lobes. Negative pain upon palpation to the chest wall. Decreased bowel sounds noted in all 4 quadrants. Abdomen distended, soft. Discomfort diffuse upon  palpation to the abdomen. EKG normal sinus rhythm identified with negative findings for ischemia. Troponin negative elevation. Urinalysis negative for infection-negative nitrites or leukocytosis identified. Lactic acid negative elevation. CBC negative elevation white blood cell count identified with negative leukocytosis. CMP negative findings. Lipase negative elevation. CT abdomen and pelvis with contrast identified small bowel obstruction that appears to be a transition point in the pelvis. Negative findings of aortic aneurysm. NG tube placed. This provider spoke with GI regarding SBO finding on CT - GI to consult patient. Discussed case with Internal Medicine - patient to be admitted to MedSurg for SBO. Discussed plan for admission with patient - patient agreed to plan of care. Patient stable for transfer.   Raymon Mutton, PA-C 01/30/13 2025

## 2013-01-30 NOTE — Progress Notes (Signed)
Received report from Marylene Land (E.D. RN), assistant director of E.D. Is placing an NGT in patient and then patient will be brought up afterwards. [place], [employer]

## 2013-01-30 NOTE — ED Notes (Signed)
Marissa, PA at bedside. 

## 2013-01-30 NOTE — ED Notes (Signed)
Pt requesting crackers and drink. Pt informed she is unable to eat or drink anything at this moment d/t SBO.

## 2013-01-31 ENCOUNTER — Inpatient Hospital Stay (HOSPITAL_COMMUNITY): Payer: Medicare Other

## 2013-01-31 DIAGNOSIS — Z96649 Presence of unspecified artificial hip joint: Secondary | ICD-10-CM

## 2013-01-31 LAB — BASIC METABOLIC PANEL
BUN: 17 mg/dL (ref 6–23)
CO2: 29 mEq/L (ref 19–32)
Chloride: 105 mEq/L (ref 96–112)
Creatinine, Ser: 0.6 mg/dL (ref 0.50–1.10)
GFR calc Af Amer: >90 mL/min (ref >90)
Glucose, Bld: 96 mg/dL (ref 70–99)
Potassium: 3.6 mEq/L (ref 3.5–5.1)

## 2013-01-31 LAB — MAGNESIUM: Magnesium: 2.3 mg/dL (ref 1.5–2.5)

## 2013-01-31 MED ORDER — HYDROCODONE-ACETAMINOPHEN 5-325 MG PO TABS
1.0000 | ORAL_TABLET | Freq: Four times a day (QID) | ORAL | Status: DC | PRN
  Administered 2013-01-31 – 2013-02-03 (×7): 1 via ORAL
  Filled 2013-01-31 (×7): qty 1

## 2013-01-31 MED ORDER — POTASSIUM CHLORIDE 10 MEQ/100ML IV SOLN: 10.0000 meq | INTRAVENOUS | Status: AC

## 2013-01-31 MED ORDER — POTASSIUM CHLORIDE CRYS ER 20 MEQ PO TBCR
40.0000 meq | EXTENDED_RELEASE_TABLET | Freq: Once | ORAL | Status: AC
  Administered 2013-01-31: 40 meq via ORAL
  Filled 2013-01-31: qty 2

## 2013-01-31 MED ORDER — ONDANSETRON HCL 4 MG/2ML IJ SOLN: 4.0000 mg | Freq: Four times a day (QID) | INTRAMUSCULAR | Status: DC | PRN

## 2013-01-31 NOTE — Progress Notes (Signed)
Pt had moderate brown, soft BM. Notified MD. Orders received.

## 2013-01-31 NOTE — Progress Notes (Signed)
INITIAL NUTRITION ASSESSMENT  DOCUMENTATION CODES Per approved criteria  -Not Applicable   INTERVENTION: 1.  Modify diet; resume PO diet once medically appropriate per MD discretion.  NUTRITION DIAGNOSIS: Inadequate oral intake related to inability to eat, nausea as evidenced by SBO.   Monitor:  1.  Food/Beverage; pt meeting >/=90% estimated needs with tolerance. 2.  Wt/wt change; monitor trends  Reason for Assessment: MST  62 y.o. female  Admitting Dx: SBO (small bowel obstruction)  ASSESSMENT: Pt admitted with SBO identified on CT scan.  Pt s/p recent hip surgery.  She has not had a BM since surgery. Pt is toileting at time of RD visit and states she has had a BM.  RN in room to inform MD. Not appropriate for nutrition-focused physical exam at this time. She has had poor appetite and reports 10 lbs wt loss in 1 year. Per chart review, her wt is stable as her usual is 180 lbs.   Height: Ht Readings from Last 1 Encounters:  01/31/13 5' 6.14" (1.68 m)    Weight: Wt Readings from Last 1 Encounters:  01/31/13 182 lb 1.6 oz (82.6 kg)    Ideal Body Weight: 130 lbs  % Ideal Body Weight: 140%  Wt Readings from Last 10 Encounters:  01/31/13 182 lb 1.6 oz (82.6 kg)  01/24/13 182 lb 1.6 oz (82.6 kg)  01/24/13 182 lb 1.6 oz (82.6 kg)  01/20/13 182 lb (82.555 kg)  05/08/11 178 lb 12.8 oz (81.103 kg)  05/08/11 178 lb 12.8 oz (81.103 kg)  04/29/11 164 lb 14.5 oz (74.8 kg)  04/09/11 163 lb (73.936 kg)  08/21/10 182 lb (82.555 kg)    Usual Body Weight: 180 lbs  % Usual Body Weight: 100%  BMI:  Body mass index is 29.27 kg/(m^2).  Estimated Nutritional Needs: Kcal: 2065-2150 Protein: 83-100g Fluid: >2.0 L/day  Skin:  Non-pitting edema Incision  Diet Order: NPO  EDUCATION NEEDS: -Education not appropriate at this time   Intake/Output Summary (Last 24 hours) at 01/31/13 1209 Last data filed at 01/31/13 0600  Gross per 24 hour  Intake 1233.33 ml  Output   1100  ml  Net 133.33 ml    Last BM: 12/16, states having one now as well  Labs:   Recent Labs Lab 01/25/13 0410 01/30/13 0745 01/31/13 0525  NA 136 141 143  K 4.0 4.2 3.6  CL 104 99 105  CO2 24 29 29   BUN 12 11 17   CREATININE 0.57 0.53 0.60  CALCIUM 8.4 9.7 9.2  MG  --   --  2.3  GLUCOSE 116* 121* 96    CBG (last 3)  No results found for this basename: GLUCAP,  in the last 72 hours  Scheduled Meds: . enoxaparin (LOVENOX) injection  40 mg Subcutaneous Q24H  . nicotine  21 mg Transdermal Q24H  . pantoprazole (PROTONIX) IV  40 mg Intravenous Q24H    Continuous Infusions: . sodium chloride 100 mL/hr at 01/31/13 0930    Past Medical History  Diagnosis Date  . Chronic bronchitis     "when I get a cold I normally get bronchitis" (01/30/2013)  . Hepatitis C     Past Surgical History  Procedure Laterality Date  . Back surgery      x5  . Colonoscopy    . Joint replacement Left 01/24/2013    hip  . Total hip arthroplasty Left 01/24/2013    Procedure: LEFT TOTAL HIP ARTHROPLASTY ANTERIOR APPROACH;  Surgeon: Kathryne Hitch, MD;  Location: [facility] OR;  Service: Orthopedics;  Laterality: Left;  . Posterior lumbar fusion  2006 X 2; 2008  . Lumbar spine surgery  2010; 2011    "tightened up some screws" (01/30/2013)  . Abdominal hysterectomy  1990's  . Tubal ligation  1970's  . Cholecystectomy  1990's    Loyce Dys, MS RD LDN Clinical Inpatient Dietitian Pager: [phone] Weekend/After hours pager: [phone]

## 2013-01-31 NOTE — Progress Notes (Signed)
Patient ID: [name], female   DOB: 1950/12/18, 62 y.o.   MRN: [id number] I did see [name] and talked with her this morning.  She states that she feels much better and is passing gas.  Her left hip is doing well and she is working on mobility.  I stressed the importance of mobility as well as decreased narcotic use combined with stool softeners.  She can be up with therapy from my standpoint and can be discharged by the medical team when they feel her bowel issues have resolved.

## 2013-01-31 NOTE — ED Provider Notes (Signed)
Medical screening examination/treatment/procedure(s) were performed by non-physician practitioner and as supervising physician I was immediately available for consultation/collaboration.  EKG Interpretation    Date/Time:  Monday January 30 2013 08:40:23 EST Ventricular Rate:  82 PR Interval:  127 QRS Duration: 86 QT Interval:  374 QTC Calculation: 437 R Axis:   43 Text Interpretation:  Normal sinus rhythm Normal ECG No significant change since last tracing Confirmed by Darnette Lampron  MD, Emoni Whitworth ([id number]) on 01/30/2013 8:49:35 AM              Audree Camel, MD 01/31/13 [phone]

## 2013-01-31 NOTE — Progress Notes (Signed)
[facility] PROGRESS NOTE  [name] MVH:[id number] DOB: 13-Apr-1950 DOA: 01/30/2013 PCP: Lucilla Edin, MD  Assessment/Plan: #1 small bowel obstruction Questionable etiology. Clinical improvement. Patient with no nausea or vomiting. Patient denies any abdominal pain. NG tube was dislodged last night. Patient passing gas. No bowel movement yet. Serial abdominal films. Keep potassium greater than 4. Keep magnesium greater than 2. GI following and appreciate recommendations. Follow.  #2 degenerative arthritis of the left hip Status post total hip replacement approximately 6 days ago. Pain control with Toradol and Robaxin. Patient has been seen by orthopedics. Once patient on by mouth will resume aspirin however continue Lovenox for DVT prophylaxis. PT/OT.  #3 tobacco abuse Nicotine patch.  Code Status: full Family Communication: updated patient and husband and granddaughter at bedside. Disposition Plan: home when medically stable.   Consultants:  Gastroenterology: Dr. Elnoria Howard 01/30/2013  Procedures:  CT abdomen and pelvis 01/30/2013  KUB 01/30/2013, 01/31/2013  Antibiotics:  none  HPI/Subjective: Patient denies any abdominal pain. No nausea no vomiting. Patient states she is passing gas. No bowel movement yet. NG tube dislodged yesterday. Patient does not want NG tube placed back in.  Objective: Filed Vitals:   01/31/13 0502  BP: 138/61  Pulse: 73  Temp: 98.2 F (36.8 C)  Resp: 20    Intake/Output Summary (Last 24 hours) at 01/31/13 1113 Last data filed at 01/31/13 0600  Gross per 24 hour  Intake 1233.33 ml  Output   1100 ml  Net 133.33 ml   Filed Weights   01/31/13 0700  Weight: 82.6 kg (182 lb 1.6 oz)    Exam:   General:  NAD  Cardiovascular: RRR  Respiratory: CTAB  Abdomen: soft, nontender, nondistended, positive bowel sounds.  Musculoskeletal: no clubbing cyanosis or edema.  Data Reviewed: Basic Metabolic Panel:  Recent Labs Lab  01/25/13 0410 01/30/13 0745 01/31/13 0525  NA 136 141 143  K 4.0 4.2 3.6  CL 104 99 105  CO2 24 29 29   GLUCOSE 116* 121* 96  BUN 12 11 17   CREATININE 0.57 0.53 0.60  CALCIUM 8.4 9.7 9.2  MG  --   --  2.3   Liver Function Tests:  Recent Labs Lab 01/30/13 0745  AST 34  ALT 26  ALKPHOS 78  BILITOT 0.8  PROT 7.7  ALBUMIN 3.3*    Recent Labs Lab 01/30/13 0745  LIPASE 14   No results found for this basename: AMMONIA,  in the last 168 hours CBC:  Recent Labs Lab 01/25/13 0410 01/26/13 0505 01/27/13 0548 01/30/13 0745  WBC 9.3 12.4* 12.2* 9.0  NEUTROABS  --   --   --  6.8  HGB 11.4* 11.0* 10.6* 11.7*  HCT 32.9* 32.8* 31.4* 34.5*  MCV 91.4 92.7 92.1 91.8  PLT 207 179 191 302   Cardiac Enzymes: No results found for this basename: CKTOTAL, CKMB, CKMBINDEX, TROPONINI,  in the last 168 hours BNP (last 3 results) No results found for this basename: PROBNP,  in the last 8760 hours CBG: No results found for this basename: GLUCAP,  in the last 168 hours  No results found for this or any previous visit (from the past 240 hour(s)).   Studies: Ct Abdomen Pelvis W Contrast  01/30/2013   CLINICAL DATA:  Lower abdominal pain  EXAM: CT ABDOMEN AND PELVIS WITH CONTRAST  TECHNIQUE: Multidetector CT imaging of the abdomen and pelvis was performed using the standard protocol following bolus administration of intravenous contrast.  CONTRAST:  80mL OMNIPAQUE IOHEXOL  300 MG/ML  SOLN  COMPARISON:  None.  FINDINGS: The lung bases unremarkable.  The liver, spleen, adrenals, pancreas, kidneys are unremarkable. [name].  Multiple dilated loops of small bowel appreciated scattered throughout the abdomen and pelvis. These loops of bowel contain air, fluid, dilute contrast, and nondilated contrast. There are findings which may represent a transition point within the mid to lower pelvis on image 60 series 2. There decompressed loops of large bowel within the  abdomen. There is no evidence of appreciable free air. There is no evidence of loculated fluid collections nor significant free fluid within the abdomen or pelvis.  There is no evidence of abdominal aortic aneurysm. Atherosclerotic calcifications identified within the aorta and iliac vessels.  The celiac, SMA, IMA, portal vein, SMV are opacified.  Evaluation pelvis is degraded by metallic beam hardening from a left hip prostheses.  IMPRESSION: Small bowel obstruction as described above what appears to be a transition point in the pelvis. Atherosclerotic disease within the aorta. Postsurgical changes identified within the thoracic and lumbar spine.   Electronically Signed   By: Salome Holmes M.D.   On: 01/30/2013 12:14   Dg Abd Portable 1v  01/31/2013   CLINICAL DATA:  Abdominal pain, history of small-bowel obstruction  EXAM: PORTABLE ABDOMEN - 1 VIEW  COMPARISON:  01/30/2013  FINDINGS: Air is seen within dilated loops of small bowel. When compared to the previous study there is decreased in numbers of dilated loops and decreased distention of the loops. Air also appears projecting region of loops of large bowel. Spinal fixation rods identified within the lumbar spine. A left hip prostheses is appreciated. Stool is identified region ascending colon appears contained dilute contrast.  IMPRESSION: Findings consistent with a resolving small bowel obstruction. Continued surveillance evaluation recommended.   Electronically Signed   By: Salome Holmes M.D.   On: 01/31/2013 07:45   Dg Abd Portable 1v  01/30/2013   CLINICAL DATA:  Small-bowel obstruction  EXAM: PORTABLE ABDOMEN - 1 VIEW  COMPARISON:  01/30/2013  FINDINGS: Dilated small bowel is again identified. The overall appearance is similar to that seen on the recent CT examination. No free air is noted. A nasogastric catheter is seen within the stomach although the proximal side port lies within the distal esophagus. Contrast from recent CT is noted.  Postsurgical changes are again seen.  IMPRESSION: Persistent small bowel dilatation.   Electronically Signed   By: Alcide Clever M.D.   On: 01/30/2013 17:05    Scheduled Meds: . enoxaparin (LOVENOX) injection  40 mg Subcutaneous Q24H  . nicotine  21 mg Transdermal Q24H  . pantoprazole (PROTONIX) IV  40 mg Intravenous Q24H  . potassium chloride  10 mEq Intravenous Q1 Hr x 3   Continuous Infusions: . sodium chloride 100 mL/hr at 01/31/13 0930    Principal Problem:   SBO (small bowel obstruction) Active Problems:   Degenerative arthritis of left hip   Status post THR (total hip replacement)   Tobacco abuse    Time spent: 30 mins    [facility] MD [facility] Pager [phone]. If 7PM-7AM, please contact night-coverage at [email or web address], password [facility] 01/31/2013, 11:13 AM  LOS: 1 day

## 2013-01-31 NOTE — Evaluation (Signed)
Physical Therapy Evaluation Patient Details Name: [name] MRN: [id number] DOB: December 18, 1950 Today's Date: 01/31/2013 Time: 4098-1191 PT Time Calculation (min): 21 min  PT Assessment / Plan / Recommendation History of Present Illness  Pt admitted with SBO identified on CT scan.  Pt s/p recent hip surgery.  Clinical Impression  Patient demonstrates some deficits in functional mobility as indicated below. Will benefit from continued skilled PT to address deficits and maximize function. Will continue to see as indicated. Encouraged continued ambulation with nsg during acute stay. Recommend resumption of HHPT for total joint upon discharge.     PT Assessment  Patient needs continued PT services    Follow Up Recommendations  Home health PT;Supervision/Assistance - 24 hour (may resume HHPT for total joint)          Equipment Recommendations  Rolling walker with 5" wheels       Frequency Min 2X/week    Precautions / Restrictions Precautions Precautions: Fall Restrictions Weight Bearing Restrictions: Yes LLE Weight Bearing: Weight bearing as tolerated   Pertinent Vitals/Pain 4/10      Mobility  Bed Mobility Bed Mobility: Supine to Sit;Sitting - Scoot to Edge of Bed;Sit to Supine Supine to Sit: 6: Modified independent (Device/Increase time) Sitting - Scoot to Edge of Bed: 6: Modified independent (Device/Increase time) Sit to Supine: 6: Modified independent (Device/Increase time) Scooting to [facility]: 5: Supervision;5: Set up Details for Bed Mobility Assistance: No physical (A) needed.   Transfers Transfers: Sit to Stand;Stand to Sit Sit to Stand: 5: Supervision;With upper extremity assist;From bed Stand to Sit: 5: Supervision;With upper extremity assist;To bed Details for Transfer Assistance: cues to reinforce safest hand placement Ambulation/Gait Ambulation/Gait Assistance: 5: Supervision Ambulation Distance (Feet): 240 Feet Assistive device: Rolling  walker Ambulation/Gait Assistance Details: VCs for step through gait and decreased reliance on RW  Gait Pattern: Step-through pattern;Decreased stride length;Decreased weight shift to left;Antalgic        PT Diagnosis: Difficulty walking;Acute pain  PT Problem List: Decreased strength;Decreased range of motion;Decreased activity tolerance;Decreased balance;Decreased mobility;Decreased knowledge of use of DME;Decreased safety awareness;Pain PT Treatment Interventions: DME instruction;Stair training;Gait training;Functional mobility training;Therapeutic activities;Therapeutic exercise;Neuromuscular re-education;Patient/family education     PT Goals(Current goals can be found in the care plan section) Acute Rehab PT Goals Patient Stated Goal: To go home PT Goal Formulation: With patient/family Time For Goal Achievement: 02/01/13 Potential to Achieve Goals: Good  Visit Information  Last PT Received On: 01/31/13 Assistance Needed: +1 History of Present Illness: Pt admitted with SBO identified on CT scan.  Pt s/p recent hip surgery.       Prior Functioning  Home Living Family/patient expects to be discharged to:: Private residence Living Arrangements: Spouse/significant other;Other relatives Available Help at Discharge: Family;Available 24 hours/day Type of Home: House Home Access: Stairs to enter [employer] of Steps: 1--landing---1 Entrance Stairs-Rails: None Home Layout: One level Home Equipment: Walker - 4 wheels;Bedside commode Prior Function Level of Independence: Independent with assistive device(s) Comments: Used rollator Communication Communication: No difficulties Dominant Hand: Right    Cognition  Cognition Arousal/Alertness: Awake/alert Behavior During Therapy: WFL for tasks assessed/performed Overall Cognitive Status: Within Functional Limits for tasks assessed    Extremity/Trunk Assessment Upper Extremity Assessment Upper Extremity Assessment:  Overall WFL for tasks assessed Lower Extremity Assessment Lower Extremity Assessment: LLE deficits/detail LLE Deficits / Details: Decreased strength and AROM consistent with L THA Cervical / Trunk Assessment Cervical / Trunk Assessment: Normal   Balance Balance Balance Assessed: Yes Static Sitting Balance Static Sitting -  Balance Support: Feet supported;Bilateral upper extremity supported Static Sitting - Level of Assistance: 7: Independent Static Standing Balance Static Standing - Balance Support: Bilateral upper extremity supported Static Standing - Level of Assistance: 6: Modified independent (Device/Increase time) Dynamic Standing Balance Dynamic Standing - Balance Support: Left upper extremity supported;During functional activity Dynamic Standing - Level of Assistance: 6: Modified independent (Device/Increase time)  End of Session PT - End of Session Equipment Utilized During Treatment: Gait belt Activity Tolerance: Patient tolerated treatment well Patient left: in bed;with call bell/phone within reach;with family/visitor present Nurse Communication: Mobility status  GP     Fabio Asa 01/31/2013, 2:18 PM Charlotte Crumb, PT DPT  [phone]

## 2013-01-31 NOTE — Progress Notes (Signed)
Subjective: Feeling better.  NG tube came out and she does not want it back in her stomach.    Objective: Vital signs in last 24 hours: Temp:  [98.2 F (36.8 C)-99.5 F (37.5 C)] 98.2 F (36.8 C) (12/23 0502) Pulse Rate:  [71-93] 73 (12/23 0502) Resp:  [13-25] 20 (12/23 0502) BP: (114-167)/(58-78) 138/61 mmHg (12/23 0502) SpO2:  [95 %-100 %] 98 % (12/23 0502) Last BM Date: 01/24/13  Intake/Output from previous day: 12/22 0701 - 12/23 0700 In: 1233.3 [I.V.:1233.3] Out: -  Intake/Output this shift: Total I/O In: 1233.3 [I.V.:1233.3] Out: -   General appearance: alert and no distress GI: soft, non-tender; bowel sounds normal; no masses,  no organomegaly  Lab Results:  Recent Labs  01/30/13 0745  WBC 9.0  HGB 11.7*  HCT 34.5*  PLT 302   BMET  Recent Labs  01/30/13 0745  NA 141  K 4.2  CL 99  CO2 29  GLUCOSE 121*  BUN 11  CREATININE 0.53  CALCIUM 9.7   LFT  Recent Labs  01/30/13 0745  PROT 7.7  ALBUMIN 3.3*  AST 34  ALT 26  ALKPHOS 78  BILITOT 0.8   PT/INR No results found for this basename: LABPROT, INR,  in the last 72 hours Hepatitis Panel No results found for this basename: HEPBSAG, HCVAB, HEPAIGM, HEPBIGM,  in the last 72 hours C-Diff No results found for this basename: CDIFFTOX,  in the last 72 hours Fecal Lactopherrin No results found for this basename: FECLLACTOFRN,  in the last 72 hours  Studies/Results: Ct Abdomen Pelvis W Contrast  01/30/2013   CLINICAL DATA:  Lower abdominal pain  EXAM: CT ABDOMEN AND PELVIS WITH CONTRAST  TECHNIQUE: Multidetector CT imaging of the abdomen and pelvis was performed using the standard protocol following bolus administration of intravenous contrast.  CONTRAST:  80mL OMNIPAQUE IOHEXOL 300 MG/ML  SOLN  COMPARISON:  None.  FINDINGS: The lung bases unremarkable.  The liver, spleen, adrenals, pancreas, kidneys are unremarkable. [name].  Multiple dilated loops of small bowel  appreciated scattered throughout the abdomen and pelvis. These loops of bowel contain air, fluid, dilute contrast, and nondilated contrast. There are findings which may represent a transition point within the mid to lower pelvis on image 60 series 2. There decompressed loops of large bowel within the abdomen. There is no evidence of appreciable free air. There is no evidence of loculated fluid collections nor significant free fluid within the abdomen or pelvis.  There is no evidence of abdominal aortic aneurysm. Atherosclerotic calcifications identified within the aorta and iliac vessels.  The celiac, SMA, IMA, portal vein, SMV are opacified.  Evaluation pelvis is degraded by metallic beam hardening from a left hip prostheses.  IMPRESSION: Small bowel obstruction as described above what appears to be a transition point in the pelvis. Atherosclerotic disease within the aorta. Postsurgical changes identified within the thoracic and lumbar spine.   Electronically Signed   By: Salome Holmes M.D.   On: 01/30/2013 12:14   Dg Abd Portable 1v  01/30/2013   CLINICAL DATA:  Small-bowel obstruction  EXAM: PORTABLE ABDOMEN - 1 VIEW  COMPARISON:  01/30/2013  FINDINGS: Dilated small bowel is again identified. The overall appearance is similar to that seen on the recent CT examination. No free air is noted. A nasogastric catheter is seen within the stomach although the proximal side port lies within the distal esophagus. Contrast from recent CT is noted. Postsurgical changes are again seen.  IMPRESSION:  Persistent small bowel dilatation.   Electronically Signed   By: Alcide Clever M.D.   On: 01/30/2013 17:05    Medications:  Scheduled: . enoxaparin (LOVENOX) injection  40 mg Subcutaneous Q24H  . nicotine  21 mg Transdermal Q24H  . pantoprazole (PROTONIX) IV  40 mg Intravenous Q24H   Continuous: . sodium chloride 100 mL/hr at 01/30/13 1740    Assessment/Plan: 1) SBO.   Clinically she is improved.  Her abdomen is  softer.  I reviewed the KUB and it appears to be improved, but official report is pending.  She has passed one bout of flatus and she wants to avoid reinsertion of the NG tube.  Plan: 1) Monitor for the next 24 hours to see if she has a recurrence of her symptoms.  She may require reinsertion of the NG tube.   LOS: 1 day   Coston Mandato D 01/31/2013, 6:52 AM

## 2013-01-31 NOTE — Progress Notes (Signed)
Reviewed chart and agree with updates.  Ruthann Cancer, PT, DPT [phone]

## 2013-02-01 ENCOUNTER — Inpatient Hospital Stay (HOSPITAL_COMMUNITY): Payer: Medicare Other

## 2013-02-01 LAB — BASIC METABOLIC PANEL
BUN: 20 mg/dL (ref 6–23)
CO2: 24 mEq/L (ref 19–32)
Chloride: 106 mEq/L (ref 96–112)
Creatinine, Ser: 0.56 mg/dL (ref 0.50–1.10)
GFR calc Af Amer: >90 mL/min (ref >90)
Glucose, Bld: 90 mg/dL (ref 70–99)
Potassium: 3.6 mEq/L (ref 3.5–5.1)

## 2013-02-01 LAB — CBC
HCT: 30 % — ABNORMAL LOW (ref 36.0–46.0)
Hemoglobin: 9.9 g/dL — ABNORMAL LOW (ref 12.0–15.0)
MCHC: 33 g/dL (ref 30.0–36.0)
MCV: 93.8 fL (ref 78.0–100.0)
RDW: 12.9 % (ref 11.5–15.5)
WBC: 7.4 10*3/uL (ref 4.0–10.5)

## 2013-02-01 MED ORDER — PANTOPRAZOLE SODIUM 40 MG PO TBEC
40.0000 mg | DELAYED_RELEASE_TABLET | Freq: Every day | ORAL | Status: DC
  Administered 2013-02-01 – 2013-02-03 (×3): 40 mg via ORAL
  Filled 2013-02-01 (×4): qty 1

## 2013-02-01 MED ORDER — WHITE PETROLATUM GEL
Status: AC
  Administered 2013-02-01: 0.2
  Filled 2013-02-01: qty 5

## 2013-02-01 MED ORDER — MENTHOL 3 MG MT LOZG
1.0000 | LOZENGE | OROMUCOSAL | Status: DC | PRN
  Administered 2013-02-01: 3 mg via ORAL
  Filled 2013-02-01: qty 9

## 2013-02-01 NOTE — Progress Notes (Signed)
[facility] PROGRESS NOTE  Kimberly LAMBRIGHT ZOX:[id number] DOB: 1950-07-08 DOA: 01/30/2013 PCP: Lucilla Edin, MD  Assessment/Plan: #1 small bowel obstruction Questionable etiology. Clinical improvement. Patient with no nausea or vomiting. Patient denies any abdominal pain. Patient passing gas. Patient with bowel movement. Serial abdominal films. Abdominal films one with concerns for ischemia. Keep potassium greater than 4. Keep magnesium greater than 2. Patient's diet has been advanced to a regular diet. Follow up KUB in the morning. GI following and appreciate recommendations. Follow.  #2 degenerative arthritis of the left hip Status post total hip replacement approximately 6 days ago. Pain control with Toradol and Robaxin. Patient has been seen by orthopedics. Continue Lovenox for DVT prophylaxis. PT/OT.  #3 tobacco abuse Nicotine patch.  Code Status: full Family Communication: updated patient and granddaughter at bedside. Disposition Plan: home when medically stable.   Consultants:  Gastroenterology: Dr. Elnoria Howard 01/30/2013  Procedures:  CT abdomen and pelvis 01/30/2013  KUB 01/30/2013, 01/31/2013  Antibiotics:  none  HPI/Subjective: Patient denies any abdominal pain. No nausea no vomiting. Patient states she is passing gas. Patient with BM.   Objective: Filed Vitals:   02/01/13 1426  BP: 113/67  Pulse: 72  Temp: 98 F (36.7 C)  Resp: 18    Intake/Output Summary (Last 24 hours) at 02/01/13 1512 Last data filed at 02/01/13 1426  Gross per 24 hour  Intake    990 ml  Output      3 ml  Net    987 ml   Filed Weights   01/31/13 0700  Weight: 82.6 kg (182 lb 1.6 oz)    Exam:   General:  NAD  Cardiovascular: RRR  Respiratory: CTAB  Abdomen: soft, nontender, nondistended, positive bowel sounds.  Musculoskeletal: no clubbing cyanosis or edema.  Data Reviewed: Basic Metabolic Panel:  Recent Labs Lab 01/30/13 0745 01/31/13 0525 02/01/13 0400   NA 141 143 140  K 4.2 3.6 3.6  CL 99 105 106  CO2 29 29 24   GLUCOSE 121* 96 90  BUN 11 17 20   CREATININE 0.53 0.60 0.56  CALCIUM 9.7 9.2 8.6  MG  --  2.3  --    Liver Function Tests:  Recent Labs Lab 01/30/13 0745  AST 34  ALT 26  ALKPHOS 78  BILITOT 0.8  PROT 7.7  ALBUMIN 3.3*    Recent Labs Lab 01/30/13 0745  LIPASE 14   No results found for this basename: AMMONIA,  in the last 168 hours CBC:  Recent Labs Lab 01/26/13 0505 01/27/13 0548 01/30/13 0745 02/01/13 0400  WBC 12.4* 12.2* 9.0 7.4  NEUTROABS  --   --  6.8  --   HGB 11.0* 10.6* 11.7* 9.9*  HCT 32.8* 31.4* 34.5* 30.0*  MCV 92.7 92.1 91.8 93.8  PLT 179 191 302 334   Cardiac Enzymes: No results found for this basename: CKTOTAL, CKMB, CKMBINDEX, TROPONINI,  in the last 168 hours BNP (last 3 results) No results found for this basename: PROBNP,  in the last 8760 hours CBG: No results found for this basename: GLUCAP,  in the last 168 hours  No results found for this or any previous visit (from the past 240 hour(s)).   Studies: Dg Abd 1 View  02/01/2013   CLINICAL DATA:  Bowel obstruction  EXAM: ABDOMEN - 1 VIEW  COMPARISON:  January 31, 2013  FINDINGS: There is less bowel dilatation compared to 1 day prior. Multiple small bowel loops do appear mildly thickened. No free air is seen.  No intramural air or portal venous air is seen on this examination. There is a total hip prosthesis on the left. There is postoperative change in the lumbar spine. There are also surgical clips in the gallbladder fossa region.  IMPRESSION: Slightly less bowel dilatation compared to 1 day prior. Several small bowel loops appear mildly thickened. This is a finding that can be associated with bowel ischemia; close clinical and imaging surveillance remain advised given this possibility. No free air is seen on this supine examination. Multiple areas of postoperative change.   Electronically Signed   By: Bretta Bang M.D.   On:  02/01/2013 08:00   Dg Abd Portable 1v  01/31/2013   CLINICAL DATA:  Abdominal pain, history of small-bowel obstruction  EXAM: PORTABLE ABDOMEN - 1 VIEW  COMPARISON:  01/30/2013  FINDINGS: Air is seen within dilated loops of small bowel. When compared to the previous study there is decreased in numbers of dilated loops and decreased distention of the loops. Air also appears projecting region of loops of large bowel. Spinal fixation rods identified within the lumbar spine. A left hip prostheses is appreciated. Stool is identified region ascending colon appears contained dilute contrast.  IMPRESSION: Findings consistent with a resolving small bowel obstruction. Continued surveillance evaluation recommended.   Electronically Signed   By: Salome Holmes M.D.   On: 01/31/2013 07:45   Dg Abd Portable 1v  01/30/2013   CLINICAL DATA:  Small-bowel obstruction  EXAM: PORTABLE ABDOMEN - 1 VIEW  COMPARISON:  01/30/2013  FINDINGS: Dilated small bowel is again identified. The overall appearance is similar to that seen on the recent CT examination. No free air is noted. A nasogastric catheter is seen within the stomach although the proximal side port lies within the distal esophagus. Contrast from recent CT is noted. Postsurgical changes are again seen.  IMPRESSION: Persistent small bowel dilatation.   Electronically Signed   By: Alcide Clever M.D.   On: 01/30/2013 17:05    Scheduled Meds: . enoxaparin (LOVENOX) injection  40 mg Subcutaneous Q24H  . nicotine  21 mg Transdermal Q24H  . pantoprazole  40 mg Oral Q1200   Continuous Infusions: . sodium chloride 75 mL/hr at 02/01/13 0416    Principal Problem:   SBO (small bowel obstruction) Active Problems:   Degenerative arthritis of left hip   Status post THR (total hip replacement)   Tobacco abuse    Time spent: 30 mins    [facility] MD [facility] Pager [phone]. If 7PM-7AM, please contact night-coverage at [email or web address], password  [facility] 02/01/2013, 3:12 PM  LOS: 2 days

## 2013-02-01 NOTE — Progress Notes (Addendum)
Subjective: Passing flatus and tolerating liquids.  Objective: Vital signs in last 24 hours: Temp:  [98.6 F (37 C)-98.9 F (37.2 C)] 98.6 F (37 C) (12/24 0509) Pulse Rate:  [63-75] 63 (12/24 0509) Resp:  [18-20] 18 (12/24 0509) BP: (118-146)/(58-73) 118/64 mmHg (12/24 0509) SpO2:  [96 %-98 %] 97 % (12/24 0509) Last BM Date: 01/31/13  Intake/Output from previous day: 12/23 0701 - 12/24 0700 In: 1240 [P.O.:490; I.V.:750] Out: 4 [Urine:3; Stool:1] Intake/Output this shift:    General appearance: alert and no distress GI: minimal abdominal tenderness.  Lab Results:  Recent Labs  01/30/13 0745 02/01/13 0400  WBC 9.0 7.4  HGB 11.7* 9.9*  HCT 34.5* 30.0*  PLT 302 334   BMET  Recent Labs  01/30/13 0745 01/31/13 0525 02/01/13 0400  NA 141 143 140  K 4.2 3.6 3.6  CL 99 105 106  CO2 29 29 24   GLUCOSE 121* 96 90  BUN 11 17 20   CREATININE 0.53 0.60 0.56  CALCIUM 9.7 9.2 8.6   LFT  Recent Labs  01/30/13 0745  PROT 7.7  ALBUMIN 3.3*  AST 34  ALT 26  ALKPHOS 78  BILITOT 0.8   PT/INR No results found for this basename: LABPROT, INR,  in the last 72 hours Hepatitis Panel No results found for this basename: HEPBSAG, HCVAB, HEPAIGM, HEPBIGM,  in the last 72 hours C-Diff No results found for this basename: CDIFFTOX,  in the last 72 hours Fecal Lactopherrin No results found for this basename: FECLLACTOFRN,  in the last 72 hours  Studies/Results: Dg Abd 1 View  02/01/2013   CLINICAL DATA:  Bowel obstruction  EXAM: ABDOMEN - 1 VIEW  COMPARISON:  January 31, 2013  FINDINGS: There is less bowel dilatation compared to 1 day prior. Multiple small bowel loops do appear mildly thickened. No free air is seen. No intramural air or portal venous air is seen on this examination. There is a total hip prosthesis on the left. There is postoperative change in the lumbar spine. There are also surgical clips in the gallbladder fossa region.  IMPRESSION: Slightly less bowel  dilatation compared to 1 day prior. Several small bowel loops appear mildly thickened. This is a finding that can be associated with bowel ischemia; close clinical and imaging surveillance remain advised given this possibility. No free air is seen on this supine examination. Multiple areas of postoperative change.   Electronically Signed   By: Bretta Bang M.D.   On: 02/01/2013 08:00   Ct Abdomen Pelvis W Contrast  01/30/2013   CLINICAL DATA:  Lower abdominal pain  EXAM: CT ABDOMEN AND PELVIS WITH CONTRAST  TECHNIQUE: Multidetector CT imaging of the abdomen and pelvis was performed using the standard protocol following bolus administration of intravenous contrast.  CONTRAST:  80mL OMNIPAQUE IOHEXOL 300 MG/ML  SOLN  COMPARISON:  None.  FINDINGS: The lung bases unremarkable.  The liver, spleen, adrenals, pancreas, kidneys are unremarkable. [name].  Multiple dilated loops of small bowel appreciated scattered throughout the abdomen and pelvis. These loops of bowel contain air, fluid, dilute contrast, and nondilated contrast. There are findings which may represent a transition point within the mid to lower pelvis on image 60 series 2. There decompressed loops of large bowel within the abdomen. There is no evidence of appreciable free air. There is no evidence of loculated fluid collections nor significant free fluid within the abdomen or pelvis.  There is no evidence of abdominal aortic aneurysm. Atherosclerotic calcifications identified  within the aorta and iliac vessels.  The celiac, SMA, IMA, portal vein, SMV are opacified.  Evaluation pelvis is degraded by metallic beam hardening from a left hip prostheses.  IMPRESSION: Small bowel obstruction as described above what appears to be a transition point in the pelvis. Atherosclerotic disease within the aorta. Postsurgical changes identified within the thoracic and lumbar spine.   Electronically Signed   By: Salome Holmes M.D.    On: 01/30/2013 12:14   Dg Abd Portable 1v  01/31/2013   CLINICAL DATA:  Abdominal pain, history of small-bowel obstruction  EXAM: PORTABLE ABDOMEN - 1 VIEW  COMPARISON:  01/30/2013  FINDINGS: Air is seen within dilated loops of small bowel. When compared to the previous study there is decreased in numbers of dilated loops and decreased distention of the loops. Air also appears projecting region of loops of large bowel. Spinal fixation rods identified within the lumbar spine. A left hip prostheses is appreciated. Stool is identified region ascending colon appears contained dilute contrast.  IMPRESSION: Findings consistent with a resolving small bowel obstruction. Continued surveillance evaluation recommended.   Electronically Signed   By: Salome Holmes M.D.   On: 01/31/2013 07:45   Dg Abd Portable 1v  01/30/2013   CLINICAL DATA:  Small-bowel obstruction  EXAM: PORTABLE ABDOMEN - 1 VIEW  COMPARISON:  01/30/2013  FINDINGS: Dilated small bowel is again identified. The overall appearance is similar to that seen on the recent CT examination. No free air is noted. A nasogastric catheter is seen within the stomach although the proximal side port lies within the distal esophagus. Contrast from recent CT is noted. Postsurgical changes are again seen.  IMPRESSION: Persistent small bowel dilatation.   Electronically Signed   By: Alcide Clever M.D.   On: 01/30/2013 17:05    Medications:  Scheduled: . enoxaparin (LOVENOX) injection  40 mg Subcutaneous Q24H  . nicotine  21 mg Transdermal Q24H  . pantoprazole (PROTONIX) IV  40 mg Intravenous Q24H   Continuous: . sodium chloride 75 mL/hr at 02/01/13 0416    Assessment/Plan: 1) SBO.   It appears that she may have resolved her SBO.  She is tolerating liquids.  I will advance her to a regular diet.  If she tolerates the regular diet she can be discharged home mid to late afternoon.  Plan: 1) Regular diet.     ADDENDUM:   I just received the results of the  AM films.  Radiology reports an improvement, but there may be a concern for bowel ischemia with the thickened loops.  Hold on her discharge.  She can still be advanced to a regular diet.  Repeat KUB in the AM - 02/02/2013.  LOS: 2 days   Delane Wessinger D 02/01/2013, 8:23 AM

## 2013-02-01 NOTE — Care Management Note (Signed)
  Page 1 of 1   02/01/2013     9:17:27 AM   CARE MANAGEMENT NOTE 02/01/2013  Patient:  NGA, RABON   Account Number:  [id number]  Date Initiated:  02/01/2013  Documentation initiated by:  Ronny Flurry  Subjective/Objective Assessment:     Action/Plan:   Anticipated DC Date:     Anticipated DC Plan:  HOME W HOME HEALTH SERVICES         Choice offered to / List presented to:  C-1 Patient        HH arranged  HH-2 PT      [facility] agency  [facility] - Cl[facility]   Status of service:   Medicare Important Message given?   (If response is "NO", the following Medicare IM given date fields will be blank) Date Medicare IM given:   Date Additional Medicare IM given:    Discharge Disposition:    Per UR Regulation:  Reviewed for med. necessity/level of care/duration of stay  If discussed at Long Length of Stay Meetings, dates discussed:    Comments:    02-03-13 Patient wanting a Rolator , same ordered and Derrian with [facility] aware . Debbie with [facility] aware planned discharge tomorrow also . Ronny Flurry RN SBN [phone]

## 2013-02-02 ENCOUNTER — Inpatient Hospital Stay (HOSPITAL_COMMUNITY): Payer: Medicare Other

## 2013-02-02 LAB — CBC
Hemoglobin: 9.7 g/dL — ABNORMAL LOW (ref 12.0–15.0)
MCH: 30.3 pg (ref 26.0–34.0)
MCHC: 33.3 g/dL (ref 30.0–36.0)
MCV: 90.9 fL (ref 78.0–100.0)
Platelets: 335 10*3/uL (ref 150–400)
RBC: 3.2 MIL/uL — ABNORMAL LOW (ref 3.87–5.11)
RDW: 12.4 % (ref 11.5–15.5)
WBC: 7.5 10*3/uL (ref 4.0–10.5)

## 2013-02-02 LAB — BASIC METABOLIC PANEL
BUN: 9 mg/dL (ref 6–23)
Calcium: 8.6 mg/dL (ref 8.4–10.5)
Chloride: 105 mEq/L (ref 96–112)
Creatinine, Ser: 0.49 mg/dL — ABNORMAL LOW (ref 0.50–1.10)
GFR calc non Af Amer: >90 mL/min (ref >90)
Sodium: 139 mEq/L (ref 135–145)

## 2013-02-02 NOTE — Progress Notes (Signed)
[facility] PROGRESS NOTE  [name] ZOX:[id number] DOB: Jan 23, 1951 DOA: 01/30/2013 PCP: Lucilla Edin, MD  Assessment/Plan: #1 small bowel obstruction Questionable etiology. Patient with some improvement yesterday was tolerating diet. Patient states however had abdominal pain all last night.  Patient with no nausea or vomiting. Patient passing gas. Patient with bowel movement this morning after KUB was done. Patient states some abdominal pain after eating a piece of bacon. Serial abdominal films. Abdominal films yesterday with concerns for ischemia. KUB this morning with dilated loops of bowel consistent with small bowel obstruction. Keep potassium greater than 4. Keep magnesium greater than 2. Will place patient on bowel rest and make n.p.o. for now.  Follow up KUB in the morning. GI following and appreciate recommendations.  #2 degenerative arthritis of the left hip Status post total hip replacement approximately 6 days ago. Pain control with Toradol and Robaxin. Patient has been seen by orthopedics. Continue Lovenox for DVT prophylaxis. PT/OT.  #3 tobacco abuse Nicotine patch.  Code Status: full Family Communication: updated patient and granddaughter at bedside. Disposition Plan: home when medically stable.   Consultants:  Gastroenterology: Dr. Elnoria Howard 01/30/2013  Procedures:  CT abdomen and pelvis 01/30/2013  KUB 01/30/2013, 01/31/2013, 02/01/2013, 02/02/2013  Antibiotics:  none  HPI/Subjective: Patient states had abdominal pain last night all night long. No nausea no vomiting. Patient states she is passing gas. Patient with BM this morning after KUB and states, showed a low bit better. Patient still with some abdominal pain she states that the eating a piece of bacon this morning.   Objective: Filed Vitals:   02/02/13 0615  BP: 136/74  Pulse: 64  Temp: 98.4 F (36.9 C)  Resp: 20    Intake/Output Summary (Last 24 hours) at 02/02/13 1040 Last data filed at  02/01/13 1426  Gross per 24 hour  Intake    240 ml  Output      1 ml  Net    239 ml   Filed Weights   01/31/13 0700  Weight: 82.6 kg (182 lb 1.6 oz)    Exam:   General:  NAD  Cardiovascular: RRR  Respiratory: CTAB  Abdomen: soft, tender to palpation in the upper abdominal region, positive bowel sounds.  Musculoskeletal: no clubbing cyanosis or edema.  Data Reviewed: Basic Metabolic Panel:  Recent Labs Lab 01/30/13 0745 01/31/13 0525 02/01/13 0400  NA 141 143 140  K 4.2 3.6 3.6  CL 99 105 106  CO2 29 29 24   GLUCOSE 121* 96 90  BUN 11 17 20   CREATININE 0.53 0.60 0.56  CALCIUM 9.7 9.2 8.6  MG  --  2.3  --    Liver Function Tests:  Recent Labs Lab 01/30/13 0745  AST 34  ALT 26  ALKPHOS 78  BILITOT 0.8  PROT 7.7  ALBUMIN 3.3*    Recent Labs Lab 01/30/13 0745  LIPASE 14   No results found for this basename: AMMONIA,  in the last 168 hours CBC:  Recent Labs Lab 01/27/13 0548 01/30/13 0745 02/01/13 0400  WBC 12.2* 9.0 7.4  NEUTROABS  --  6.8  --   HGB 10.6* 11.7* 9.9*  HCT 31.4* 34.5* 30.0*  MCV 92.1 91.8 93.8  PLT 191 302 334   Cardiac Enzymes: No results found for this basename: CKTOTAL, CKMB, CKMBINDEX, TROPONINI,  in the last 168 hours BNP (last 3 results) No results found for this basename: PROBNP,  in the last 8760 hours CBG: No results found for this basename: GLUCAP,  in the last 168 hours  No results found for this or any previous visit (from the past 240 hour(s)).   Studies: Dg Abd 1 View  02/02/2013   CLINICAL DATA:  Bowel ischemia, abdominal pain  EXAM: ABDOMEN - 1 VIEW  COMPARISON:  02/01/2013  FINDINGS: Several dilated mid abdominal small bowel loops are reidentified, largest 4.4 cm in diameter, not significantly changed when allowing for differences in technique. Presence or absence of air-fluid levels or free air is suboptimally evaluated on this supine projection. Lumbar fusion hardware and left total hip arthroplasty  partly visualized. Pelvic phleboliths are noted. No abnormal radiopacity. Upper abdomen is omitted from the field of view.  IMPRESSION: Stable dilated mid abdominal small bowel loops suggesting small bowel obstruction.   Electronically Signed   By: Christiana Pellant M.D.   On: 02/02/2013 09:33   Dg Abd 1 View  02/01/2013   CLINICAL DATA:  Bowel obstruction  EXAM: ABDOMEN - 1 VIEW  COMPARISON:  January 31, 2013  FINDINGS: There is less bowel dilatation compared to 1 day prior. Multiple small bowel loops do appear mildly thickened. No free air is seen. No intramural air or portal venous air is seen on this examination. There is a total hip prosthesis on the left. There is postoperative change in the lumbar spine. There are also surgical clips in the gallbladder fossa region.  IMPRESSION: Slightly less bowel dilatation compared to 1 day prior. Several small bowel loops appear mildly thickened. This is a finding that can be associated with bowel ischemia; close clinical and imaging surveillance remain advised given this possibility. No free air is seen on this supine examination. Multiple areas of postoperative change.   Electronically Signed   By: Bretta Bang M.D.   On: 02/01/2013 08:00    Scheduled Meds: . enoxaparin (LOVENOX) injection  40 mg Subcutaneous Q24H  . nicotine  21 mg Transdermal Q24H  . pantoprazole  40 mg Oral Q1200   Continuous Infusions:    Principal Problem:   SBO (small bowel obstruction) Active Problems:   Degenerative arthritis of left hip   Status post THR (total hip replacement)   Tobacco abuse    Time spent: 30 mins    [facility] MD [facility] Pager [phone]. If 7PM-7AM, please contact night-coverage at [email or web address], password [facility] 02/02/2013, 10:40 AM  LOS: 3 days

## 2013-02-02 NOTE — Progress Notes (Signed)
Subjective: Developed persistent abdominal pain since eating.  No nausea or vomiting  Objective: Vital signs in last 24 hours: Temp:  [98 F (36.7 C)-99 F (37.2 C)] 98.4 F (36.9 C) (12/25 0615) Pulse Rate:  [64-72] 64 (12/25 0615) Resp:  [18-20] 20 (12/25 0615) BP: (113-157)/(67-79) 136/74 mmHg (12/25 0615) SpO2:  [95 %-99 %] 99 % (12/25 0615) Last BM Date: 02/01/13  Abdomen mildly tender; BS hypoactive  Intake/Output from previous day: 12/24 0701 - 12/25 0700 In: 480 [P.O.:480] Out: 3 [Urine:3] Intake/Output this shift:      Lab Results:  Recent Labs  02/01/13 0400  WBC 7.4  HGB 9.9*  HCT 30.0*  PLT 334   BMET  Recent Labs  01/31/13 0525 02/01/13 0400  NA 143 140  K 3.6 3.6  CL 105 106  CO2 29 24  GLUCOSE 96 90  BUN 17 20  CREATININE 0.60 0.56  CALCIUM 9.2 8.6   LFT No results found for this basename: PROT, ALBUMIN, AST, ALT, ALKPHOS, BILITOT, BILIDIR, IBILI,  in the last 72 hours PT/INR No results found for this basename: LABPROT, INR,  in the last 72 hours Hepatitis Panel No results found for this basename: HEPBSAG, HCVAB, HEPAIGM, HEPBIGM,  in the last 72 hours C-Diff No results found for this basename: CDIFFTOX,  in the last 72 hours Fecal Lactopherrin No results found for this basename: FECLLACTOFRN,  in the last 72 hours  Studies/Results: Dg Abd 1 View  02/02/2013   CLINICAL DATA:  Bowel ischemia, abdominal pain  EXAM: ABDOMEN - 1 VIEW  COMPARISON:  02/01/2013  FINDINGS: Several dilated mid abdominal small bowel loops are reidentified, largest 4.4 cm in diameter, not significantly changed when allowing for differences in technique. Presence or absence of air-fluid levels or free air is suboptimally evaluated on this supine projection. Lumbar fusion hardware and left total hip arthroplasty partly visualized. Pelvic phleboliths are noted. No abnormal radiopacity. Upper abdomen is omitted from the field of view.  IMPRESSION: Stable dilated mid  abdominal small bowel loops suggesting small bowel obstruction.   Electronically Signed   By: Christiana Pellant M.D.   On: 02/02/2013 09:33   Dg Abd 1 View  02/01/2013   CLINICAL DATA:  Bowel obstruction  EXAM: ABDOMEN - 1 VIEW  COMPARISON:  January 31, 2013  FINDINGS: There is less bowel dilatation compared to 1 day prior. Multiple small bowel loops do appear mildly thickened. No free air is seen. No intramural air or portal venous air is seen on this examination. There is a total hip prosthesis on the left. There is postoperative change in the lumbar spine. There are also surgical clips in the gallbladder fossa region.  IMPRESSION: Slightly less bowel dilatation compared to 1 day prior. Several small bowel loops appear mildly thickened. This is a finding that can be associated with bowel ischemia; close clinical and imaging surveillance remain advised given this possibility. No free air is seen on this supine examination. Multiple areas of postoperative change.   Electronically Signed   By: Bretta Bang M.D.   On: 02/01/2013 08:00    Medications:  Scheduled: . enoxaparin (LOVENOX) injection  40 mg Subcutaneous Q24H  . nicotine  21 mg Transdermal Q24H  . pantoprazole  40 mg Oral Q1200       Assessment/Plan: 1) SBO, persistent, as evidence by x-ray and postprandial abdominal pain.  Agree with placing her NPO again.  Repeat CBC, KUB in am.     LOS: 3 days  Melvia Heaps 02/02/2013, 11:45 AM

## 2013-02-03 ENCOUNTER — Inpatient Hospital Stay (HOSPITAL_COMMUNITY): Payer: Medicare Other

## 2013-02-03 LAB — BASIC METABOLIC PANEL
BUN: 11 mg/dL (ref 6–23)
CO2: 23 mEq/L (ref 19–32)
Calcium: 8.8 mg/dL (ref 8.4–10.5)
Chloride: 105 mEq/L (ref 96–112)
Creatinine, Ser: 0.53 mg/dL (ref 0.50–1.10)
GFR calc Af Amer: >90 mL/min (ref >90)
Glucose, Bld: 86 mg/dL (ref 70–99)
Sodium: 139 mEq/L (ref 135–145)

## 2013-02-03 LAB — CBC
MCHC: 34.3 g/dL (ref 30.0–36.0)
MCV: 91.6 fL (ref 78.0–100.0)
Platelets: 375 10*3/uL (ref 150–400)
RBC: 3.47 MIL/uL — ABNORMAL LOW (ref 3.87–5.11)
RDW: 12.6 % (ref 11.5–15.5)
WBC: 6.8 10*3/uL (ref 4.0–10.5)

## 2013-02-03 NOTE — Progress Notes (Signed)
Physical Therapy Treatment Patient Details Name: [name] MRN: [id number] DOB: Jun 02, 1950 Today's Date: 02/03/2013 Time: 7829-5621 PT Time Calculation (min): 23 min  PT Assessment / Plan / Recommendation  History of Present Illness Pt admitted with SBO identified on CT scan.  Pt s/p recent Lt THA - anterior approach.   PT Comments   Patient making good gains with mobility/gait.  Patient requesting rollator rather than RW for seat.  CM notified.  Follow Up Recommendations  Home health PT;Supervision/Assistance - 24 hour     Does the patient have the potential to tolerate intense rehabilitation     Barriers to Discharge        Equipment Recommendations   (Rollator - 4-wheeled walker with seat)    Recommendations for Other Services    Frequency Min 2X/week   Progress towards PT Goals Progress towards PT goals: Progressing toward goals  Plan Current plan remains appropriate;Equipment recommendations need to be updated    Precautions / Restrictions Precautions Precautions: Fall Restrictions Weight Bearing Restrictions: Yes LLE Weight Bearing: Weight bearing as tolerated   Pertinent Vitals/Pain     Mobility  Bed Mobility Bed Mobility: Supine to Sit Supine to Sit: 7: Independent;HOB flat Details for Bed Mobility Assistance: No cues or assist needed, Transfers Transfers: Sit to Stand;Stand to Sit Sit to Stand: 6: Modified independent (Device/Increase time);With upper extremity assist;From bed Stand to Sit: 6: Modified independent (Device/Increase time);With upper extremity assist;To bed Details for Transfer Assistance: Patient using safe technique. Ambulation/Gait Ambulation/Gait Assistance: 5: Supervision Ambulation Distance (Feet): 250 Feet Assistive device: Rolling walker Ambulation/Gait Assistance Details: Verbal cues to stand upright during gait.  Patient using RW safely. Gait Pattern: Step-through pattern;Decreased stride length;Decreased weight shift to  left;Antalgic;Trunk flexed Gait velocity: Slow gait speed      PT Goals (current goals can now be found in the care plan section)    Visit Information  Last PT Received On: 02/03/13 Assistance Needed: +1 History of Present Illness: Pt admitted with SBO identified on CT scan.  Pt s/p recent Lt THA - anterior approach.    Subjective Data  Subjective: I've been up moving.  I want to walk as much as I can.   Cognition  Cognition Arousal/Alertness: Awake/alert Behavior During Therapy: WFL for tasks assessed/performed Overall Cognitive Status: Within Functional Limits for tasks assessed    Balance     End of Session PT - End of Session Equipment Utilized During Treatment: Gait belt Activity Tolerance: Patient tolerated treatment well Patient left: in bed;with call bell/phone within reach;with family/visitor present (sitting EOB) Nurse Communication: Mobility status   GP     Vena Austria 02/03/2013, 4:17 PM Durenda Hurt. Renaldo Fiddler, [facility] Acute Rehab Services Pager [phone]

## 2013-02-03 NOTE — Discharge Summary (Signed)
Physician Discharge Summary  [name] ZOX:[id number] DOB: 13-Dec-1950 DOA: 01/30/2013  PCP: Lucilla Edin, MD  Admit date: 01/30/2013 Discharge date: 02/03/2013  Time spent: 65 minutes  Recommendations for Outpatient Follow-up:  1. Followup with DAUB, STEVE A, MD one week post discharge. 2. Followup with Dr. Magnus Ivan of orthopedics as previously scheduled   Discharge Diagnoses:  Principal Problem:   SBO (small bowel obstruction) Active Problems:   Degenerative arthritis of left hip   Status post THR (total hip replacement)   Tobacco abuse   Discharge Condition: Stable and improved  Diet recommendation: Regular  Filed Weights   01/31/13 0700  Weight: 82.6 kg (182 lb 1.6 oz)    History of present illness:  62 year old female with history of chronic back pain with multiple back surgery, active smoker degenerative arthritis of left hip who underwent left total hip arthroplasty done on 12/17 and was discharged home on 12/20 (2 days back), returned to the ED today with severe abdominal pain mainly l over infraumbilical area. Patient reports that she started having pain over the abdomen one day after her surgery and had mentioned about this. She was given Mylanta and GI cocktail for the symptoms. She reports that she has not had a bowel movement since the surgery which was 60s back. Since he was discharged home she has been taking 2-3 tablets of oxycodone for her hip pain. She has been having worsened pain in her abdomen and has not passed gas since the last 2 days. She denies any fever or chills but did have several episodes of nausea and vomiting since yesterday. She denies any chest pain, shortness of breath, palpitations, dysuria or hematuria.. she denies similar symptoms in the past.  Course in the ED  His vitals were stable. Blood will done was unremarkable. Lipase was normal. A CT scan of the abdomen and pelvis was done which showed small bowel obstruction with transition  point or to her pelvis. An NG tube was placed and [facility] consulted for admission to medical floor. GI Dr. Eliot Ford was consulted by the ED as well.   Hospital Course:  #1 Small Bowel Obstruction Patient presented with abdominal pain to the ED with several episodes of nausea no vomiting. CT of the abdomen and pelvis which was done in the ED was consistent with a small bowel obstruction with transition point. Patient was admitted. NG tube was placed. Patient was placed on bowel rest placed on IV fluids and supportive care. A GI consultation was obtained and patient was seen in consultation by Dr. Elnoria Howard. Patient was monitored. Patient's NG tube came out in the middle of the night and due to resolution of patient's emesis and was not replaced. Patient initially seemed to improve clinically she was started on a diet which he tolerated however during the hospitalization she developed worsening abdominal pain and nausea. Patient was subsequently placed back on bowel rest and on IV fluids. Serial abdominal films were also done. Patient improved clinically was passing gas and had a bowel movement. Patient was subsequently restarted back on a clear liquid diet. Abdominal x-ray on the day of discharge showed resolution of small bowel obstruction. Patient's diet was advanced to a regular diet which he tolerated with no abdominal pain. Patient also had a bowel movement. Patient will be discharged in stable and improved condition to followup with PCP as outpatient.  #2 degenerative arthritis of the left hip/status post total hip replacement. Patient had been recently discharged from the  hospital after having total left hip replacement. Patient was seen by physical therapy during the hospitalization and that also been seen bowel to pediatrics. Patient was maintained on Toradol for pain control as well as the Robaxin. Patient will was followed by physical therapy throughout the hospitalization the patient was  discharged home in stable condition with home health therapies. Patient will followup with orthopedics as outpatient as previously scheduled.  #3 tobacco abuse Tobacco cessation was stressed to the patient. Patient was placed on a nicotine patch.  Procedures: CT abdomen and pelvis 01/30/2013  KUB 01/30/2013, 01/31/2013, 02/01/2013, 02/02/2013, 02/03/13   Consultations: Gastroenterology: Dr. Elnoria Howard 01/30/2013   Discharge Exam: Filed Vitals:   02/03/13 1402  BP: 138/64  Pulse: 69  Temp: 98.3 F (36.8 C)  Resp: 17    General: NAD Cardiovascular: RRR Respiratory: CTAB  Discharge Instructions      Discharge Orders   Future Orders Complete By Expires   Diet general  As directed    Discharge instructions  As directed    Comments:     Follow up with DAUB, STEVE A, MD in 1 week. Follow up with Dr Magnus Ivan as previously scheduled.   Increase activity slowly  As directed        Medication List         aspirin 325 MG tablet  Take 325 mg by mouth daily.     aspirin-sod bicarb-citric acid 325 MG Tbef tablet  Commonly known as:  ALKA-SELTZER  Take 325 mg by mouth daily as needed (indigestion).     bismuth subsalicylate 262 MG/15ML suspension  Commonly known as:  PEPTO BISMOL  Take 30 mLs by mouth every 6 (six) hours as needed for indigestion or diarrhea or loose stools.     calcium carbonate 500 MG chewable tablet  Commonly known as:  TUMS - dosed in mg elemental calcium  Chew 2 tablets by mouth daily as needed for indigestion or heartburn.     oxyCODONE-acetaminophen 5-325 MG per tablet  Commonly known as:  ROXICET  Take 1-2 tablets by mouth every 4 (four) hours as needed for severe pain.     polyethylene glycol packet  Commonly known as:  MIRALAX / GLYCOLAX  Take 17 g by mouth daily.       No Known Allergies Follow-up Information   Follow up with Lucilla Edin, MD.   Specialty:  Family Medicine   Contact information:   [address] [place]  [address] [phone]       Follow up with Kathryne Hitch, MD. (f/u as previously scheduled)    Specialty:  Orthopedic Surgery   Contact information:   [address] [place] [address] [phone]        The results of significant diagnostics from this hospitalization (including imaging, microbiology, ancillary and laboratory) are listed below for reference.    Significant Diagnostic Studies: Dg Hip Operative Left  01/24/2013   CLINICAL DATA:  History of avascular necrosis, left anterior total hip replacement  EXAM: OPERATIVE LEFT HIP  COMPARISON:  Left hip MRI -12/06/2011  FLUOROSCOPY TIME:  44 seconds  FINDINGS: Three spot intraoperative fluoroscopic images of the lower pelvis and left hip are provided for review.  Initial image demonstrates increased sclerosis of the bilateral femoral heads compatible with history of avascular necrosis better demonstrated on prior pelvis MRI.  Subsequent images demonstrate the sequela of a left total hip replacement. Alignment appears near anatomic given solitary projection. No definite fracture. There is a minimal  amount of expected subcutaneous emphysema about the operative site.  No definite radiopaque foreign body.  IMPRESSION: Post left total hip replacement without definite evidence of complication.   Electronically Signed   By: Simonne Come M.D.   On: 01/24/2013 17:04   Dg Abd 1 View  02/03/2013   CLINICAL DATA:  Abdominal pain, small-bowel obstruction  EXAM: ABDOMEN - 1 VIEW  COMPARISON:  02/02/2013; 02/01/2013; CT abdomen and pelvis - 01/30/2013  FINDINGS: Interval resolution of previously noted gaseous distention of several loops of small bowel within the mid abdomen with gas now seen within the colon.  Nonobstructive bowel gas pattern. Unremarkable colonic stool burden.  Nondiagnostic evaluation for pneumoperitoneum secondary to supine position exclusion the lower thorax. No definite pneumatosis or portal venous gas.  No definite  abnormal intra-abdominal calcifications.  Post lung segment paraspinal fusion and left total hip replacement, incompletely evaluated.  IMPRESSION: Findings suggestive of resolved small bowel obstruction.   Electronically Signed   By: Simonne Come M.D.   On: 02/03/2013 08:21   Dg Abd 1 View  02/02/2013   CLINICAL DATA:  Bowel ischemia, abdominal pain  EXAM: ABDOMEN - 1 VIEW  COMPARISON:  02/01/2013  FINDINGS: Several dilated mid abdominal small bowel loops are reidentified, largest 4.4 cm in diameter, not significantly changed when allowing for differences in technique. Presence or absence of air-fluid levels or free air is suboptimally evaluated on this supine projection. Lumbar fusion hardware and left total hip arthroplasty partly visualized. Pelvic phleboliths are noted. No abnormal radiopacity. Upper abdomen is omitted from the field of view.  IMPRESSION: Stable dilated mid abdominal small bowel loops suggesting small bowel obstruction.   Electronically Signed   By: Christiana Pellant M.D.   On: 02/02/2013 09:33   Dg Abd 1 View  02/01/2013   CLINICAL DATA:  Bowel obstruction  EXAM: ABDOMEN - 1 VIEW  COMPARISON:  January 31, 2013  FINDINGS: There is less bowel dilatation compared to 1 day prior. Multiple small bowel loops do appear mildly thickened. No free air is seen. No intramural air or portal venous air is seen on this examination. There is a total hip prosthesis on the left. There is postoperative change in the lumbar spine. There are also surgical clips in the gallbladder fossa region.  IMPRESSION: Slightly less bowel dilatation compared to 1 day prior. Several small bowel loops appear mildly thickened. This is a finding that can be associated with bowel ischemia; close clinical and imaging surveillance remain advised given this possibility. No free air is seen on this supine examination. Multiple areas of postoperative change.   Electronically Signed   By: Bretta Bang M.D.   On: 02/01/2013  08:00   Ct Abdomen Pelvis W Contrast  01/30/2013   CLINICAL DATA:  Lower abdominal pain  EXAM: CT ABDOMEN AND PELVIS WITH CONTRAST  TECHNIQUE: Multidetector CT imaging of the abdomen and pelvis was performed using the standard protocol following bolus administration of intravenous contrast.  CONTRAST:  80mL OMNIPAQUE IOHEXOL 300 MG/ML  SOLN  COMPARISON:  None.  FINDINGS: The lung bases unremarkable.  The liver, spleen, adrenals, pancreas, kidneys are unremarkable. [name].  Multiple dilated loops of small bowel appreciated scattered throughout the abdomen and pelvis. These loops of bowel contain air, fluid, dilute contrast, and nondilated contrast. There are findings which may represent a transition point within the mid to lower pelvis on image 60 series 2. There decompressed loops of large bowel within the abdomen. There is no  evidence of appreciable free air. There is no evidence of loculated fluid collections nor significant free fluid within the abdomen or pelvis.  There is no evidence of abdominal aortic aneurysm. Atherosclerotic calcifications identified within the aorta and iliac vessels.  The celiac, SMA, IMA, portal vein, SMV are opacified.  Evaluation pelvis is degraded by metallic beam hardening from a left hip prostheses.  IMPRESSION: Small bowel obstruction as described above what appears to be a transition point in the pelvis. Atherosclerotic disease within the aorta. Postsurgical changes identified within the thoracic and lumbar spine.   Electronically Signed   By: Salome Holmes M.D.   On: 01/30/2013 12:14   Dg Pelvis Portable  01/24/2013   CLINICAL DATA:  Status post left hip replacement  EXAM: PORTABLE PELVIS 1-2 VIEWS  COMPARISON:  None.  FINDINGS: A left hip prosthesis is noted. No acute fracture or dislocation is seen. No acute soft tissue abnormality is noted.   Electronically Signed   By: Alcide Clever M.D.   On: 01/24/2013 21:03   Dg Hip Portable 1 View  Left  01/24/2013   CLINICAL DATA:  Status post left hip replacement  EXAM: PORTABLE LEFT HIP - 1 VIEW  COMPARISON:  None.  FINDINGS: A left hip replacement is now seen in satisfactory position. No acute abnormality is noted.   Electronically Signed   By: Alcide Clever M.D.   On: 01/24/2013 20:08   Dg Abd Portable 1v  01/31/2013   CLINICAL DATA:  Abdominal pain, history of small-bowel obstruction  EXAM: PORTABLE ABDOMEN - 1 VIEW  COMPARISON:  01/30/2013  FINDINGS: Air is seen within dilated loops of small bowel. When compared to the previous study there is decreased in numbers of dilated loops and decreased distention of the loops. Air also appears projecting region of loops of large bowel. Spinal fixation rods identified within the lumbar spine. A left hip prostheses is appreciated. Stool is identified region ascending colon appears contained dilute contrast.  IMPRESSION: Findings consistent with a resolving small bowel obstruction. Continued surveillance evaluation recommended.   Electronically Signed   By: Salome Holmes M.D.   On: 01/31/2013 07:45   Dg Abd Portable 1v  01/30/2013   CLINICAL DATA:  Small-bowel obstruction  EXAM: PORTABLE ABDOMEN - 1 VIEW  COMPARISON:  01/30/2013  FINDINGS: Dilated small bowel is again identified. The overall appearance is similar to that seen on the recent CT examination. No free air is noted. A nasogastric catheter is seen within the stomach although the proximal side port lies within the distal esophagus. Contrast from recent CT is noted. Postsurgical changes are again seen.  IMPRESSION: Persistent small bowel dilatation.   Electronically Signed   By: Alcide Clever M.D.   On: 01/30/2013 17:05    Microbiology: No results found for this or any previous visit (from the past 240 hour(s)).   Labs: Basic Metabolic Panel:  Recent Labs Lab 01/30/13 0745 01/31/13 0525 02/01/13 0400 02/02/13 1215 02/03/13 0530  NA 141 143 140 139 139  K 4.2 3.6 3.6 3.8 3.7  CL 99  105 106 105 105  CO2 29 29 24 23 23   GLUCOSE 121* 96 90 93 86  BUN 11 17 20 9 11   CREATININE 0.53 0.60 0.56 0.49* 0.53  CALCIUM 9.7 9.2 8.6 8.6 8.8  MG  --  2.3  --   --   --    Liver Function Tests:  Recent Labs Lab 01/30/13 0745  AST 34  ALT 26  ALKPHOS  78  BILITOT 0.8  PROT 7.7  ALBUMIN 3.3*    Recent Labs Lab 01/30/13 0745  LIPASE 14   No results found for this basename: AMMONIA,  in the last 168 hours CBC:  Recent Labs Lab 01/30/13 0745 02/01/13 0400 02/02/13 1215 02/03/13 0530  WBC 9.0 7.4 7.5 6.8  NEUTROABS 6.8  --   --   --   HGB 11.7* 9.9* 9.7* 10.9*  HCT 34.5* 30.0* 29.1* 31.8*  MCV 91.8 93.8 90.9 91.6  PLT 302 334 335 375   Cardiac Enzymes: No results found for this basename: CKTOTAL, CKMB, CKMBINDEX, TROPONINI,  in the last 168 hours BNP: BNP (last 3 results) No results found for this basename: PROBNP,  in the last 8760 hours CBG: No results found for this basename: GLUCAP,  in the last 168 hours     Signed:  [facility] MD [facility] 02/03/2013, 5:17 PM

## 2013-02-03 NOTE — Progress Notes (Signed)
Subjective: No complaints.  No abdominal pain.  Objective: Vital signs in last 24 hours: Temp:  [97.9 F (36.6 C)-98.2 F (36.8 C)] 98.2 F (36.8 C) (12/26 0527) Pulse Rate:  [68-71] 69 (12/26 0527) Resp:  [18] 18 (12/26 0527) BP: (130-157)/(69-78) 130/75 mmHg (12/26 0527) SpO2:  [99 %-100 %] 100 % (12/26 0527) Last BM Date: 02/02/13  Intake/Output from previous day:   Intake/Output this shift:    General appearance: alert and no distress GI: soft, non-tender; bowel sounds normal; no masses,  no organomegaly  Lab Results:  Recent Labs  02/01/13 0400 02/02/13 1215 02/03/13 0530  WBC 7.4 7.5 6.8  HGB 9.9* 9.7* 10.9*  HCT 30.0* 29.1* 31.8*  PLT 334 335 375   BMET  Recent Labs  02/01/13 0400 02/02/13 1215 02/03/13 0530  NA 140 139 139  K 3.6 3.8 3.7  CL 106 105 105  CO2 24 23 23   GLUCOSE 90 93 86  BUN 20 9 11   CREATININE 0.56 0.49* 0.53  CALCIUM 8.6 8.6 8.8   LFT No results found for this basename: PROT, ALBUMIN, AST, ALT, ALKPHOS, BILITOT, BILIDIR, IBILI,  in the last 72 hours PT/INR No results found for this basename: LABPROT, INR,  in the last 72 hours Hepatitis Panel No results found for this basename: HEPBSAG, HCVAB, HEPAIGM, HEPBIGM,  in the last 72 hours C-Diff No results found for this basename: CDIFFTOX,  in the last 72 hours Fecal Lactopherrin No results found for this basename: FECLLACTOFRN,  in the last 72 hours  Studies/Results: Dg Abd 1 View  02/03/2013   CLINICAL DATA:  Abdominal pain, small-bowel obstruction  EXAM: ABDOMEN - 1 VIEW  COMPARISON:  02/02/2013; 02/01/2013; CT abdomen and pelvis - 01/30/2013  FINDINGS: Interval resolution of previously noted gaseous distention of several loops of small bowel within the mid abdomen with gas now seen within the colon.  Nonobstructive bowel gas pattern. Unremarkable colonic stool burden.  Nondiagnostic evaluation for pneumoperitoneum secondary to supine position exclusion the lower thorax. No  definite pneumatosis or portal venous gas.  No definite abnormal intra-abdominal calcifications.  Post lung segment paraspinal fusion and left total hip replacement, incompletely evaluated.  IMPRESSION: Findings suggestive of resolved small bowel obstruction.   Electronically Signed   By: Simonne Come M.D.   On: 02/03/2013 08:21   Dg Abd 1 View  02/02/2013   CLINICAL DATA:  Bowel ischemia, abdominal pain  EXAM: ABDOMEN - 1 VIEW  COMPARISON:  02/01/2013  FINDINGS: Several dilated mid abdominal small bowel loops are reidentified, largest 4.4 cm in diameter, not significantly changed when allowing for differences in technique. Presence or absence of air-fluid levels or free air is suboptimally evaluated on this supine projection. Lumbar fusion hardware and left total hip arthroplasty partly visualized. Pelvic phleboliths are noted. No abnormal radiopacity. Upper abdomen is omitted from the field of view.  IMPRESSION: Stable dilated mid abdominal small bowel loops suggesting small bowel obstruction.   Electronically Signed   By: Christiana Pellant M.D.   On: 02/02/2013 09:33    Medications:  Scheduled: . enoxaparin (LOVENOX) injection  40 mg Subcutaneous Q24H  . nicotine  21 mg Transdermal Q24H  . pantoprazole  40 mg Oral Q1200   Continuous:   Assessment/Plan: 1) SBO.   This AM's KUB reveals that her SBO has resolved.  Clinically she feels well.  No abdominal pain.    Plan: 1) Advance to a regular diet. 2) If no pain she can be discharged home.  LOS: 4 days   [name] D 02/03/2013, 10:45 AM

## 2013-02-07 ENCOUNTER — Ambulatory Visit (INDEPENDENT_AMBULATORY_CARE_PROVIDER_SITE_OTHER): Payer: Medicare Other | Admitting: Family Medicine

## 2013-02-07 ENCOUNTER — Ambulatory Visit: Payer: Medicare HMO

## 2013-02-07 VITALS — BP 110/60 | HR 71 | Temp 98.6°F | Resp 18 | Ht 66.0 in | Wt 178.0 lb

## 2013-02-07 DIAGNOSIS — K56609 Unspecified intestinal obstruction, unspecified as to partial versus complete obstruction: Secondary | ICD-10-CM

## 2013-02-07 DIAGNOSIS — K56600 Partial intestinal obstruction, unspecified as to cause: Secondary | ICD-10-CM

## 2013-02-07 DIAGNOSIS — R109 Unspecified abdominal pain: Secondary | ICD-10-CM

## 2013-02-07 LAB — POCT CBC
Granulocyte percent: 64.8 %G (ref 37–80)
HCT, POC: 38 % (ref 37.7–47.9)
Lymph, poc: 2.9 (ref 0.6–3.4)
MCH, POC: 29.9 pg (ref 27–31.2)
MCHC: 30.3 g/dL — AB (ref 31.8–35.4)
MCV: 98.7 fL — AB (ref 80–97)
MID (cbc): 0.6 (ref 0–0.9)
POC LYMPH PERCENT: 29.3 %L (ref 10–50)
Platelet Count, POC: 439 10*3/uL — AB (ref 142–424)
RDW, POC: 14.2 %

## 2013-02-07 NOTE — Patient Instructions (Signed)
One half dose of MiraLax. May repeat if tolerated.  Go to the emergency room if abruptly worse.  Stick with a liquid diet for the next several days.  Return in about one week for recheck

## 2013-02-07 NOTE — Progress Notes (Signed)
Subjective: Several weeks ago the patient had a total hip surgery. Last week she ended up having to be hospitalized with a partial small bowel obstruction. She has been home from the hospital for for 5 days. She has had possibly one BM right when she got home from the hospital but none since then. She's been eating some. She's not had nausea and vomiting. Today he has been having pain in her abdomen. No fever.Ate Arby[place]on the way over here.  Objective: Pleasant alert lady. Chest clear. Heart regular without murmurs. Abdomen had fairly normal bowel sounds without any rushes or tinkling. No major tenderness. No rebound. Mild nonspecific low abdominal tenderness.  Results for orders placed in visit on 02/07/13  POCT CBC      Result Value Range   WBC 10.0  4.6 - 10.2 K/uL   Lymph, poc 2.9  0.6 - 3.4   POC LYMPH PERCENT 29.3  10 - 50 %L   MID (cbc) 0.6  0 - 0.9   POC MID % 5.9  0 - 12 %M   POC Granulocyte 6.5  2 - 6.9   Granulocyte percent 64.8  37 - 80 %G   RBC 3.85 (*) 4.04 - 5.48 M/uL   Hemoglobin 11.5 (*) 12.2 - 16.2 g/dL   HCT, POC 16.1  09.6 - 47.9 %   MCV 98.7 (*) 80 - 97 fL   MCH, POC 29.9  27 - 31.2 pg   MCHC 30.3 (*) 31.8 - 35.4 g/dL   RDW, POC 04.5     Platelet Count, POC 439 (*) 142 - 424 K/uL   MPV 8.5  0 - 99.8 fL   [facility] reading (PRIMARY) by  Dr. Alwyn Ren Nonspecific small bowel gas.  No air fluid levels.  Assessment: Low and mid abdominal pain. Partial small bowel  Plan: One half dose of MiraLax. May repeat if tolerated. Go to the emergency room if abruptly worse. Stick with a liquid diet for the next several days. Return in about one week for recheck

## 2013-02-10 ENCOUNTER — Telehealth: Payer: Self-pay

## 2013-02-10 NOTE — Telephone Encounter (Signed)
Pt returning call about x rays

## 2013-02-13 NOTE — Telephone Encounter (Signed)
Pt notified-see xray report

## 2013-02-20 ENCOUNTER — Telehealth: Payer: Self-pay

## 2013-02-20 DIAGNOSIS — Z96649 Presence of unspecified artificial hip joint: Secondary | ICD-10-CM

## 2013-02-20 NOTE — Telephone Encounter (Signed)
Caryl Pina from [facility] also called and stated that they have already sent orders and notes to [facility] and should receive the updates on pt, we only need to send the referral per [facility]. Pt has already been without therapy for 12 days and just had a total L hip replacement.

## 2013-02-20 NOTE — Telephone Encounter (Signed)
She obviously needs PT so please refer.  It is ridiculous that insurance will not accept surgeon's referral for post-op PT.

## 2013-02-20 NOTE — Telephone Encounter (Signed)
[name] from [facility] to request referral for continuing PT for hip after hip surgery. [facility] (contact Caryl Pina [phone]) had ordered PT but pt's ins changed to [facility] which requires referral to be made through the PCP. Dr Linna Darner, you are the only provider who has seen pt recently. Do you want to refer for PT or have pt RTC to be seen first by Dr Everlene Farrier? We can also call [facility] for notes/instr's? Please advise.

## 2013-03-21 ENCOUNTER — Other Ambulatory Visit: Payer: Self-pay

## 2013-03-21 DIAGNOSIS — Z1231 Encounter for screening mammogram for malignant neoplasm of breast: Secondary | ICD-10-CM

## 2013-04-11 ENCOUNTER — Other Ambulatory Visit: Payer: Self-pay | Admitting: Emergency Medicine

## 2013-04-11 ENCOUNTER — Ambulatory Visit
Admission: RE | Admit: 2013-04-11 | Discharge: 2013-04-11 | Disposition: A | Discharge status: Home / Self Care | Payer: Self-pay | Source: Ambulatory Visit

## 2013-04-11 DIAGNOSIS — R928 Other abnormal and inconclusive findings on diagnostic imaging of breast: Secondary | ICD-10-CM

## 2013-04-11 DIAGNOSIS — Z1231 Encounter for screening mammogram for malignant neoplasm of breast: Secondary | ICD-10-CM

## 2013-04-13 DIAGNOSIS — M5126 Other intervertebral disc displacement, lumbar region: Secondary | ICD-10-CM | POA: Insufficient documentation

## 2013-04-21 ENCOUNTER — Ambulatory Visit
Admission: RE | Admit: 2013-04-21 | Discharge: 2013-04-21 | Disposition: A | Discharge status: Home / Self Care | Payer: Commercial Managed Care - HMO | Source: Ambulatory Visit | Attending: Emergency Medicine | Admitting: Emergency Medicine

## 2013-04-21 DIAGNOSIS — R928 Other abnormal and inconclusive findings on diagnostic imaging of breast: Secondary | ICD-10-CM

## 2013-05-05 ENCOUNTER — Ambulatory Visit (INDEPENDENT_AMBULATORY_CARE_PROVIDER_SITE_OTHER): Payer: Medicare HMO | Admitting: Internal Medicine

## 2013-05-05 VITALS — BP 152/70 | HR 67 | Temp 97.9°F | Resp 16 | Ht 66.0 in | Wt 187.0 lb

## 2013-05-05 DIAGNOSIS — M545 Low back pain, unspecified: Secondary | ICD-10-CM

## 2013-05-05 MED ORDER — HYDROCODONE-ACETAMINOPHEN 5-325 MG PO TABS: ORAL_TABLET | ORAL | Status: DC

## 2013-05-05 MED ORDER — CYCLOBENZAPRINE HCL 10 MG PO TABS
10.0000 mg | ORAL_TABLET | Freq: Every day | ORAL | Status: DC
Start: 2013-05-05 — End: 2013-06-12

## 2013-05-05 NOTE — Progress Notes (Signed)
Subjective:    Patient ID: [name], female    DOB: 1950-09-09, 63 y.o.   MRN: [id number]  HPI This chart was scribed for [facility], by Lovena Le Day, Scribe. This patient was seen in room 3 and the patient's care was started at 10:37 AM.  HPI Comments: [name] is a 63 y.o. female who presents to the [facility] for medication refill. She reports has been followed by Dr. Saintclair Halsted, status post left hip replacement and has been having referred pain to her left lateral thigh which Dr. Saintclair Halsted believes is result from her back instead of her hip. She has been taking pain medicine for this problem. She denies any numbness. She is also scheduled to have an MRI of her back, but Dr Saintclair Halsted wants pain meds to be done by PCP which is [facility]   Past Medical History  Diagnosis Date  . Chronic bronchitis     "when I get a cold I normally get bronchitis" (01/30/2013)  . Hepatitis C     No Known Allergies  No orders of the defined types were placed in this encounter.    Review of Systems  Constitutional: Negative for fever and chills.  Respiratory: Negative for cough and shortness of breath.   Cardiovascular: Negative for chest pain.  Gastrointestinal: Negative for abdominal pain.  Musculoskeletal: Positive for back pain.      Objective:   Physical Exam  Nursing note and vitals reviewed. Constitutional: She is oriented to person, place, and time. She appears well-developed and well-nourished. No distress.  HENT:  Head: Normocephalic and atraumatic.  Eyes: Conjunctivae are normal. Right eye exhibits no discharge. Left eye exhibits no discharge.  Neck: Normal range of motion.  Cardiovascular: Normal rate.   Pulmonary/Chest: Effort normal. No respiratory distress.  Musculoskeletal: She exhibits no edema.  Tender L Lumbar and L lat hip SLR pos L at 85degr Hip rom =incr pain with Ext Rot No mot.sens losses   Neurological: She is alert and oriented to person,  place, and time.  Skin: Skin is warm and dry.  Psychiatric: She has a normal mood and affect. Thought content normal.   Triage Vitals: BP 152/70  Pulse 67  Temp(Src) 97.9 F (36.6 C) (Oral)  Resp 16  Ht 5\' 6"  (1.676 m)  Wt 187 lb (84.823 kg)  BMI 30.20 kg/m2  SpO2 98%      Assessment & Plan:  DIAGNOSTIC STUDIES: Oxygen Saturation is 98% on room air, normal by my interpretation.    COORDINATION OF CARE: At 1055 AM Discussed treatment plan with patient which includes muscle relaxer . Patient agrees.   I personally performed the services described in this documentation, which was scribed in my presence. The recorded information has been reviewed and is accurate. I have completed the patient encounter in its entirety as documented by the scribe, with editing by me where necessary. Trypp Heckmann P. Laney Pastor, M.D.  Lumbar pain chronic s/p 5 surgeries  Will prescr meds til Dr Saintclair Halsted decides if there is a surgical or PT intervention and if not will ref for chr pain mgmt Meds ordered this encounter  Medications  . HYDROcodone-acetaminophen (NORCO/VICODIN) 5-325 MG per tablet    Sig: 1 at bedtime for pain as needed    Dispense:  30 tablet    Refill:  0  . cyclobenzaprine (FLEXERIL) 10 MG tablet    Sig: Take 1 tablet (10 mg total) by mouth at bedtime.    Dispense:  30 tablet    Refill:  0   To see if improves am pain/stiffness

## 2013-05-16 ENCOUNTER — Other Ambulatory Visit: Payer: Self-pay | Admitting: Neurosurgery

## 2013-05-16 DIAGNOSIS — M5126 Other intervertebral disc displacement, lumbar region: Secondary | ICD-10-CM

## 2013-05-19 ENCOUNTER — Ambulatory Visit
Admission: RE | Admit: 2013-05-19 | Discharge: 2013-05-19 | Disposition: A | Discharge status: Home / Self Care | Payer: Commercial Managed Care - HMO | Source: Ambulatory Visit | Attending: Neurosurgery | Admitting: Neurosurgery

## 2013-05-19 DIAGNOSIS — M5126 Other intervertebral disc displacement, lumbar region: Secondary | ICD-10-CM

## 2013-06-11 ENCOUNTER — Other Ambulatory Visit: Payer: Self-pay | Admitting: Internal Medicine

## 2013-06-12 MED ORDER — CYCLOBENZAPRINE HCL 10 MG PO TABS: 10.0000 mg | ORAL_TABLET | Freq: Every day | ORAL | Status: DC

## 2013-07-06 ENCOUNTER — Ambulatory Visit (INDEPENDENT_AMBULATORY_CARE_PROVIDER_SITE_OTHER): Payer: Medicare HMO | Admitting: Family Medicine

## 2013-07-06 VITALS — BP 126/76 | HR 70 | Temp 98.2°F | Resp 18 | Ht 67.0 in | Wt 187.0 lb

## 2013-07-06 DIAGNOSIS — M545 Low back pain, unspecified: Secondary | ICD-10-CM

## 2013-07-06 MED ORDER — CYCLOBENZAPRINE HCL 10 MG PO TABS: 10.0000 mg | ORAL_TABLET | Freq: Two times a day (BID) | ORAL | Status: DC | PRN

## 2013-07-06 NOTE — Progress Notes (Signed)
Subjective:  This chart was scribed for Delman Cheadle, MD by Delphia Grates, ED Scribe. This patient was seen in room Room/bed 5 and the patient's care was started at 10:34 AM.   Patient ID: [name], female    DOB: Aug 01, 1950, 63 y.o.   MRN: [id number]  Chief Complaint  Patient presents with   Medication Refill    Flexeril     HPI HPI Comments: [name] is a 63 y.o. female who presents to the [facility] for medication refill. [name] states she needs a refill of Flexeril for her back. Patient states she is no longer taking hydrocodone. She states Dr. Laney Pastor changed her medication from hydrocodone to Flexeril and she reports the Flexeril is more effective. She reports she sometimes has to take a dose during the day although she is prescribed to take it at night. She states she still does some physical therapy on her own. Patient reports she was working at [employer] for 14 years and has not been able to return to work due to her back.   F/u from Dr. Saintclair Halsted for left hip replacement with referred pain to left lateral thigh which may be from her back therefore she underwent xray and ct imaging on 05/16/13, which showed loosening of the left L1 pedicle screw, and that the left L1-L2 facet joint was not infused as intended. Dr. Saintclair Halsted has asked that her pain medicine be prescribed by her PCP here at [facility]. She has had 5 surgeries for her chronic lumbar pain and is waiting to see if Dr. Saintclair Halsted decides whether she needs surgery or PT intervention. She may need referral to chronic pain management in the future. [name] has been receiving her pain medication through providers at [facility] and Dr. Saintclair Halsted at [place] of hydrocodone 7.5mg  Number 60 from [facility] and hydrocodone 10mg  number 90 from [facility] and she has been alternating between the two which each refill.   PCP: Dr. Everlene Farrier   Past Medical History  Diagnosis Date    Chronic bronchitis     "when I get a cold I normally get bronchitis" (01/30/2013)   Hepatitis C    Current Outpatient Prescriptions on File Prior to Visit  Medication Sig Dispense Refill   cyclobenzaprine (FLEXERIL) 10 MG tablet Take 1 tablet (10 mg total) by mouth at bedtime.  30 tablet  0   No current facility-administered medications on file prior to visit.   No Known Allergies  Review of Systems  Constitutional: Negative for fever, chills and appetite change.  Musculoskeletal: Positive for back pain.      BP 126/76   Pulse 70   Temp(Src) 98.2 F (36.8 C) (Oral)   Resp 18   Ht 5\' 7"  (1.702 m)   Wt 187 lb (84.823 kg)   BMI 29.28 kg/m2   SpO2 99%  Objective:   Physical Exam  Nursing note and vitals reviewed. Constitutional: She is oriented to person, place, and time. She appears well-developed and well-nourished. No distress.  HENT:  Head: Normocephalic and atraumatic.  Eyes: EOM are normal.  Neck: Neck supple. No tracheal deviation present.  Cardiovascular: Normal rate.   Pulmonary/Chest: Effort normal. No respiratory distress.  Musculoskeletal: Normal range of motion.  Neurological: She is alert and oriented to person, place, and time.  Skin: Skin is warm and dry.  Psychiatric: She has a normal mood and affect. Her behavior is normal.  Assessment & Plan:   Lumbar back pain  Meds ordered this encounter  Medications   cyclobenzaprine (FLEXERIL) 10 MG tablet    Sig: Take 1 tablet (10 mg total) by mouth 2 (two) times daily as needed for muscle spasms.    Dispense:  60 tablet    Refill:  1    I personally performed the services described in this documentation, which was scribed in my presence. The recorded information has been reviewed and considered, and addended by me as needed.  Delman Cheadle, MD MPH

## 2013-07-17 ENCOUNTER — Emergency Department (HOSPITAL_COMMUNITY)
Admission: EM | Admit: 2013-07-17 | Discharge: 2013-07-17 | Disposition: A | Discharge status: Home / Self Care | Payer: No Typology Code available for payment source | Attending: Emergency Medicine | Admitting: Emergency Medicine

## 2013-07-17 ENCOUNTER — Emergency Department (HOSPITAL_COMMUNITY): Payer: No Typology Code available for payment source

## 2013-07-17 ENCOUNTER — Encounter (HOSPITAL_COMMUNITY): Payer: Self-pay | Admitting: Emergency Medicine

## 2013-07-17 DIAGNOSIS — Y9389 Activity, other specified: Secondary | ICD-10-CM | POA: Insufficient documentation

## 2013-07-17 DIAGNOSIS — Y92410 Unspecified street and highway as the place of occurrence of the external cause: Secondary | ICD-10-CM | POA: Insufficient documentation

## 2013-07-17 DIAGNOSIS — Z8709 Personal history of other diseases of the respiratory system: Secondary | ICD-10-CM | POA: Insufficient documentation

## 2013-07-17 DIAGNOSIS — S39012A Strain of muscle, fascia and tendon of lower back, initial encounter: Secondary | ICD-10-CM

## 2013-07-17 DIAGNOSIS — S139XXA Sprain of joints and ligaments of unspecified parts of neck, initial encounter: Secondary | ICD-10-CM | POA: Insufficient documentation

## 2013-07-17 DIAGNOSIS — F17200 Nicotine dependence, unspecified, uncomplicated: Secondary | ICD-10-CM | POA: Insufficient documentation

## 2013-07-17 DIAGNOSIS — S161XXA Strain of muscle, fascia and tendon at neck level, initial encounter: Secondary | ICD-10-CM

## 2013-07-17 DIAGNOSIS — Z9889 Other specified postprocedural states: Secondary | ICD-10-CM | POA: Insufficient documentation

## 2013-07-17 DIAGNOSIS — R55 Syncope and collapse: Secondary | ICD-10-CM

## 2013-07-17 DIAGNOSIS — S335XXA Sprain of ligaments of lumbar spine, initial encounter: Secondary | ICD-10-CM | POA: Insufficient documentation

## 2013-07-17 DIAGNOSIS — Z8619 Personal history of other infectious and parasitic diseases: Secondary | ICD-10-CM | POA: Insufficient documentation

## 2013-07-17 MED ORDER — KETOROLAC TROMETHAMINE 30 MG/ML IJ SOLN
30.0000 mg | Freq: Once | INTRAMUSCULAR | Status: AC
  Administered 2013-07-17: 30 mg via INTRAMUSCULAR
  Filled 2013-07-17: qty 1

## 2013-07-17 MED ORDER — IBUPROFEN 800 MG PO TABS: 800.0000 mg | ORAL_TABLET | Freq: Three times a day (TID) | ORAL | Status: DC

## 2013-07-17 MED ORDER — CYCLOBENZAPRINE HCL 10 MG PO TABS: 10.0000 mg | ORAL_TABLET | Freq: Every day | ORAL | Status: DC

## 2013-07-17 MED ORDER — HYDROCODONE-ACETAMINOPHEN 5-325 MG PO TABS: 1.0000 | ORAL_TABLET | ORAL | Status: DC | PRN

## 2013-07-17 MED ORDER — OXYCODONE-ACETAMINOPHEN 5-325 MG PO TABS
2.0000 | ORAL_TABLET | Freq: Once | ORAL | Status: AC
  Administered 2013-07-17: 2 via ORAL
  Filled 2013-07-17: qty 2

## 2013-07-17 NOTE — ED Notes (Signed)
Pt able to walk to wheelchair. AVS in hand and explained thoroughly. Pt knows not to take any tylenol due to discharge Norco ordered. Family and patient acknowledges understanding.
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
## 2013-07-19 NOTE — ED Provider Notes (Signed)
Medical screening examination/treatment/procedure(s) were performed by non-physician practitioner and as supervising physician I was immediately available for consultation/collaboration.   EKG Interpretation None        Ephraim Hamburger, MD 07/19/13 1640

## 2013-07-24 ENCOUNTER — Emergency Department (HOSPITAL_COMMUNITY)
Admission: EM | Admit: 2013-07-24 | Discharge: 2013-07-24 | Disposition: A | Discharge status: Home / Self Care | Payer: No Typology Code available for payment source | Attending: Emergency Medicine | Admitting: Emergency Medicine

## 2013-07-24 ENCOUNTER — Encounter (HOSPITAL_COMMUNITY): Payer: Self-pay | Admitting: Emergency Medicine

## 2013-07-24 DIAGNOSIS — Z791 Long term (current) use of non-steroidal anti-inflammatories (NSAID): Secondary | ICD-10-CM | POA: Insufficient documentation

## 2013-07-24 DIAGNOSIS — Z8619 Personal history of other infectious and parasitic diseases: Secondary | ICD-10-CM | POA: Insufficient documentation

## 2013-07-24 DIAGNOSIS — F17200 Nicotine dependence, unspecified, uncomplicated: Secondary | ICD-10-CM | POA: Insufficient documentation

## 2013-07-24 DIAGNOSIS — G8911 Acute pain due to trauma: Secondary | ICD-10-CM | POA: Insufficient documentation

## 2013-07-24 DIAGNOSIS — Z9889 Other specified postprocedural states: Secondary | ICD-10-CM | POA: Insufficient documentation

## 2013-07-24 DIAGNOSIS — M549 Dorsalgia, unspecified: Secondary | ICD-10-CM | POA: Insufficient documentation

## 2013-07-24 DIAGNOSIS — Z8709 Personal history of other diseases of the respiratory system: Secondary | ICD-10-CM | POA: Insufficient documentation

## 2013-07-24 MED ORDER — KETOROLAC TROMETHAMINE 60 MG/2ML IM SOLN
60.0000 mg | Freq: Once | INTRAMUSCULAR | Status: AC
  Administered 2013-07-24: 60 mg via INTRAMUSCULAR
  Filled 2013-07-24: qty 2
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
## 2013-07-24 NOTE — Discharge Instructions (Signed)
Back Pain, Adult Low back pain is very common. About 1 in 5 people have back pain.The cause of low back pain is rarely dangerous. The pain often gets better over time.About half of people with a sudden onset of back pain feel better in just 2 weeks. About 8 in 10 people feel better by 6 weeks.  CAUSES Some common causes of back pain include:  Strain of the muscles or ligaments supporting the spine.  Wear and tear (degeneration) of the spinal discs.  Arthritis.  Direct injury to the back. DIAGNOSIS Most of the time, the direct cause of low back pain is not known.However, back pain can be treated effectively even when the exact cause of the pain is unknown.Answering your caregiver's questions about your overall health and symptoms is one of the most accurate ways to make sure the cause of your pain is not dangerous. If your caregiver needs more information, he or she may order lab work or imaging tests (X-rays or MRIs).However, even if imaging tests show changes in your back, this usually does not require surgery. HOME CARE INSTRUCTIONS For many people, back pain returns.Since low back pain is rarely dangerous, it is often a condition that people can learn to manageon their own.   Remain active. It is stressful on the back to sit or stand in one place. Do not sit, drive, or stand in one place for more than 30 minutes at a time. Take short walks on level surfaces as soon as pain allows.Try to increase the length of time you walk each day.  Do not stay in bed.Resting more than 1 or 2 days can delay your recovery.  Do not avoid exercise or work.Your body is made to move.It is not dangerous to be active, even though your back may hurt.Your back will likely heal faster if you return to being active before your pain is gone.  Pay attention to your body when you bend and lift. Many people have less discomfortwhen lifting if they bend their knees, keep the load close to their bodies,and  avoid twisting. Often, the most comfortable positions are those that put less stress on your recovering back.  Find a comfortable position to sleep. Use a firm mattress and lie on your side with your knees slightly bent. If you lie on your back, put a pillow under your knees.  Only take over-the-counter or prescription medicines as directed by your caregiver. Over-the-counter medicines to reduce pain and inflammation are often the most helpful.Your caregiver may prescribe muscle relaxant drugs.These medicines help dull your pain so you can more quickly return to your normal activities and healthy exercise.  Put ice on the injured area.  Put ice in a plastic bag.  Place a towel between your skin and the bag.  Leave the ice on for 15-20 minutes, 03-04 times a day for the first 2 to 3 days. After that, ice and heat may be alternated to reduce pain and spasms.  Ask your caregiver about trying back exercises and gentle massage. This may be of some benefit.  Avoid feeling anxious or stressed.Stress increases muscle tension and can worsen back pain.It is important to recognize when you are anxious or stressed and learn ways to manage it.Exercise is a great option. SEEK MEDICAL CARE IF:  You have pain that is not relieved with rest or medicine.  You have pain that does not improve in 1 week.  You have new symptoms.  You are generally not feeling well. SEEK   IMMEDIATE MEDICAL CARE IF:   You have pain that radiates from your back into your legs.  You develop new bowel or bladder control problems.  You have unusual weakness or numbness in your arms or legs.  You develop nausea or vomiting.  You develop abdominal pain.  You feel faint. Document Released: 01/26/2005 Document Revised: 07/28/2011 Document Reviewed: 06/16/2010 Exit[employer] Patient Information 2014 [employer].  
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
## 2013-07-24 NOTE — ED Provider Notes (Signed)
  Medical screening examination/treatment/procedure(s) were performed by non-physician practitioner and as supervising physician I was immediately available for consultation/collaboration.   EKG Interpretation None         Carmin Muskrat, MD 07/24/13 1626
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
## 2013-07-28 ENCOUNTER — Ambulatory Visit (INDEPENDENT_AMBULATORY_CARE_PROVIDER_SITE_OTHER): Payer: Medicare HMO | Admitting: Emergency Medicine

## 2013-07-28 VITALS — BP 122/80 | HR 82 | Temp 97.9°F | Resp 18 | Ht 66.0 in | Wt 189.0 lb

## 2013-07-28 DIAGNOSIS — M545 Low back pain, unspecified: Secondary | ICD-10-CM

## 2013-07-28 DIAGNOSIS — M542 Cervicalgia: Secondary | ICD-10-CM
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
## 2013-08-04 DIAGNOSIS — M419 Scoliosis, unspecified: Secondary | ICD-10-CM | POA: Insufficient documentation

## 2013-08-08 ENCOUNTER — Ambulatory Visit: Payer: Medicare HMO | Admitting: Emergency Medicine

## 2013-10-14 ENCOUNTER — Ambulatory Visit (INDEPENDENT_AMBULATORY_CARE_PROVIDER_SITE_OTHER): Payer: Medicare HMO

## 2013-10-14 ENCOUNTER — Ambulatory Visit (INDEPENDENT_AMBULATORY_CARE_PROVIDER_SITE_OTHER): Payer: Medicare HMO | Admitting: Emergency Medicine

## 2013-10-14 VITALS — BP 126/74 | HR 74 | Temp 98.0°F | Resp 16 | Ht 65.0 in | Wt 191.6 lb

## 2013-10-14 DIAGNOSIS — R9389 Abnormal findings on diagnostic imaging of other specified body structures: Secondary | ICD-10-CM

## 2013-10-14 DIAGNOSIS — R05 Cough: Secondary | ICD-10-CM

## 2013-10-14 DIAGNOSIS — IMO0001 Reserved for inherently not codable concepts without codable children: Secondary | ICD-10-CM

## 2013-10-14 DIAGNOSIS — R059 Cough, unspecified: Secondary | ICD-10-CM

## 2013-10-14 DIAGNOSIS — R062 Wheezing: Secondary | ICD-10-CM

## 2013-10-14 DIAGNOSIS — F17200 Nicotine dependence, unspecified, uncomplicated: Secondary | ICD-10-CM

## 2013-10-14 MED ORDER — BUDESONIDE-FORMOTEROL FUMARATE 160-4.5 MCG/ACT IN AERO: 2.0000 | INHALATION_SPRAY | Freq: Two times a day (BID) | RESPIRATORY_TRACT | Status: DC

## 2013-10-14 MED ORDER — CEFDINIR 300 MG PO CAPS: 600.0000 mg | ORAL_CAPSULE | Freq: Every day | ORAL | Status: DC

## 2013-10-14 MED ORDER — IPRATROPIUM BROMIDE 0.02 % IN SOLN
0.5000 mg | Freq: Once | RESPIRATORY_TRACT | Status: AC
  Administered 2013-10-14: 0.5 mg via RESPIRATORY_TRACT

## 2013-10-14 MED ORDER — ALBUTEROL SULFATE (2.5 MG/3ML) 0.083% IN NEBU
2.5000 mg | INHALATION_SOLUTION | Freq: Once | RESPIRATORY_TRACT | Status: AC
Start: 2013-10-14 — End: 2013-10-14
  Administered 2013-10-14: 2.5 mg via RESPIRATORY_TRACT

## 2013-10-14 MED ORDER — CYCLOBENZAPRINE HCL 10 MG PO TABS: 10.0000 mg | ORAL_TABLET | Freq: Two times a day (BID) | ORAL | Status: DC | PRN

## 2013-10-14 NOTE — Patient Instructions (Signed)
Smoking Cessation Quitting smoking is important to your health and has many advantages. However, it is not always easy to quit since nicotine is a very addictive drug. Oftentimes, people try 3 times or more before being able to quit. This document explains the best ways for you to prepare to quit smoking. Quitting takes hard work and a lot of effort, but you can do it. ADVANTAGES OF QUITTING SMOKING  You will live longer, feel better, and live better.  Your body will feel the impact of quitting smoking almost immediately.  Within 20 minutes, blood pressure decreases. Your pulse returns to its normal level.  After 8 hours, carbon monoxide levels in the blood return to normal. Your oxygen level increases.  After 24 hours, the chance of having a heart attack starts to decrease. Your breath, hair, and body stop smelling like smoke.  After 48 hours, damaged nerve endings begin to recover. Your sense of taste and smell improve.  After 72 hours, the body is virtually free of nicotine. Your bronchial tubes relax and breathing becomes easier.  After 2 to 12 weeks, lungs can hold more air. Exercise becomes easier and circulation improves.  The risk of having a heart attack, stroke, cancer, or lung disease is greatly reduced.  After 1 year, the risk of coronary heart disease is cut in half.  After 5 years, the risk of stroke falls to the same as a nonsmoker.  After 10 years, the risk of lung cancer is cut in half and the risk of other cancers decreases significantly.  After 15 years, the risk of coronary heart disease drops, usually to the level of a nonsmoker.  If you are pregnant, quitting smoking will improve your chances of having a healthy baby.  The people you live with, especially any children, will be healthier.  You will have extra money to spend on things other than cigarettes. QUESTIONS TO THINK ABOUT BEFORE ATTEMPTING TO QUIT You may want to talk about your answers with your  health care provider.  Why do you want to quit?  If you tried to quit in the past, what helped and what did not?  What will be the most difficult situations for you after you quit? How will you plan to handle them?  Who can help you through the tough times? Your family? Friends? A health care provider?  What pleasures do you get from smoking? What ways can you still get pleasure if you quit? Here are some questions to ask your health care provider:  How can you help me to be successful at quitting?  What medicine do you think would be best for me and how should I take it?  What should I do if I need more help?  What is smoking withdrawal like? How can I get information on withdrawal? GET READY  Set a quit date.  Change your environment by getting rid of all cigarettes, ashtrays, matches, and lighters in your home, car, or work. Do not let people smoke in your home.  Review your past attempts to quit. Think about what worked and what did not. GET SUPPORT AND ENCOURAGEMENT You have a better chance of being successful if you have help. You can get support in many ways.  Tell your family, friends, and coworkers that you are going to quit and need their support. Ask them not to smoke around you.  Get individual, group, or telephone counseling and support. Programs are available at local hospitals and health centers. Call   your local health department for information about programs in your area.  Spiritual beliefs and practices may help some smokers quit.  Download a "quit meter" on your computer to keep track of quit statistics, such as how long you have gone without smoking, cigarettes not smoked, and money saved.  Get a self-help book about quitting smoking and staying off tobacco. LEARN NEW SKILLS AND BEHAVIORS  Distract yourself from urges to smoke. Talk to someone, go for a walk, or occupy your time with a task.  Change your normal routine. Take a different route to work.  Drink tea instead of coffee. Eat breakfast in a different place.  Reduce your stress. Take a hot bath, exercise, or read a book.  Plan something enjoyable to do every day. Reward yourself for not smoking.  Explore interactive web-based programs that specialize in helping you quit. GET MEDICINE AND USE IT CORRECTLY Medicines can help you stop smoking and decrease the urge to smoke. Combining medicine with the above behavioral methods and support can greatly increase your chances of successfully quitting smoking.  Nicotine replacement therapy helps deliver nicotine to your body without the negative effects and risks of smoking. Nicotine replacement therapy includes nicotine gum, lozenges, inhalers, nasal sprays, and skin patches. Some may be available over-the-counter and others require a prescription.  Antidepressant medicine helps people abstain from smoking, but how this works is unknown. This medicine is available by prescription.  Nicotinic receptor partial agonist medicine simulates the effect of nicotine in your brain. This medicine is available by prescription. Ask your health care provider for advice about which medicines to use and how to use them based on your health history. Your health care provider will tell you what side effects to look out for if you choose to be on a medicine or therapy. Carefully read the information on the package. Do not use any other product containing nicotine while using a nicotine replacement product.  RELAPSE OR DIFFICULT SITUATIONS Most relapses occur within the first 3 months after quitting. Do not be discouraged if you start smoking again. Remember, most people try several times before finally quitting. You may have symptoms of withdrawal because your body is used to nicotine. You may crave cigarettes, be irritable, feel very hungry, cough often, get headaches, or have difficulty concentrating. The withdrawal symptoms are only temporary. They are strongest  when you first quit, but they will go away within 10-14 days. To reduce the chances of relapse, try to:  Avoid drinking alcohol. Drinking lowers your chances of successfully quitting.  Reduce the amount of caffeine you consume. Once you quit smoking, the amount of caffeine in your body increases and can give you symptoms, such as a rapid heartbeat, sweating, and anxiety.  Avoid smokers because they can make you want to smoke.  Do not let weight gain distract you. Many smokers will gain weight when they quit, usually less than 10 pounds. Eat a healthy diet and stay active. You can always lose the weight gained after you quit.  Find ways to improve your mood other than smoking. FOR MORE INFORMATION  www.smokefree.gov  Document Released: 01/20/2001 Document Revised: 06/12/2013 Document Reviewed: 05/07/2011 [employer] Patient Information 2015 [employer]. This information is not intended to replace advice given to you by your health care provider. Make sure you discuss any questions you have with your health care provider.  

## 2013-10-14 NOTE — Progress Notes (Addendum)
Subjective:    Patient ID: [name], female    DOB: 1950/11/17, 62 y.o.   MRN: [id number]  This chart was scribed for Kimberly Queen, MD by Erling Conte, Medical Scribe. This patient was seen in Room 1 and the patient's care was started at 12:12 PM.  Chief Complaint  Patient presents with  . Medication Refill    Flexeril  . Cough    x3 weeks    HPI HPI Comments: [name] is a 63 y.o. female with a h.o SBO, tobacco abuse, degenerative arthritis of hip, and s/p THR who presents to the [facility] for a refill of her Flexeril medication. She has had 5 surgeries on her back in the past. Pt notes her back hurts her mostly at night and in the morning when she wakes up. She also reports she has had an intermittent, moderate, gradually worsening, cough for 3 weeks. Cough is productive of clear and yellow sputum. She is a current every day smoker, smokes 1 pack per day. Her last CXR was in April 2015. She denies any heartburn, flatulence, or reflux. She does note that she has been having some bowel incontinence and she strains when she tries to walk. She sees Dr. Kary Kos for her back issues but he does not believe the bowel incontinence is related to her back issues. She denies any recent colonoscopies or recent visit to GI specialist.  She denies any shortness of breath, wheezing, chest pain, fever, or chills.    Patient Active Problem List   Diagnosis Date Noted  . SBO (small bowel obstruction) 01/30/2013  . Tobacco abuse 01/30/2013  . Degenerative arthritis of left hip 01/24/2013  . Status post THR (total hip replacement) 01/24/2013   Past Medical History  Diagnosis Date  . Chronic bronchitis     "when I get a cold I normally get bronchitis" (01/30/2013)  . Hepatitis C    Past Surgical History  Procedure Laterality Date  . Back surgery      x5  . Colonoscopy    . Joint replacement Left 01/24/2013    hip  . Total hip arthroplasty Left 01/24/2013    Procedure: LEFT TOTAL HIP ARTHROPLASTY ANTERIOR APPROACH;  Surgeon: Mcarthur Rossetti, MD;  Location: [facility];  Service: Orthopedics;  Laterality: Left;  . Posterior lumbar fusion  2006 X 2; 2008  . Lumbar spine surgery  2010; 2011    "tightened up some screws" (01/30/2013)  . Abdominal hysterectomy  1990's  . Tubal ligation  1970's  . Cholecystectomy  1990's   No Known Allergies Prior to Admission medications   Medication Sig Start Date End Date Taking? Authorizing Provider  cyclobenzaprine (FLEXERIL) 10 MG tablet Take 1 tablet (10 mg total) by mouth 2 (two) times daily as needed for muscle spasms. 07/06/13  Yes Shawnee Knapp, MD  HYDROcodone-acetaminophen (NORCO/VICODIN) 5-325 MG per tablet Take 1 tablet by mouth every 4 (four) hours as needed for moderate pain or severe pain. 07/17/13  Yes Harvie Heck, PA-C  ibuprofen (ADVIL,MOTRIN) 800 MG tablet Take 1 tablet (800 mg total) by mouth 3 (three) times daily. Take with food 07/17/13  Yes Harvie Heck, PA-C   History   Social History  . Marital Status: Married    Spouse Name: N/A    Number of Children: N/A  . Years of Education: N/A   Occupational History  . Not on file.   Social History Main Topics  . Smoking status:  Current Every Day Smoker -- 1.00 packs/day for 45 years    Types: Cigarettes  . Smokeless tobacco: Never Used  . Alcohol Use: Yes     Comment: 01/30/2013 "used to drink beer; none since ~ 1989"  . Drug Use: No  . Sexual Activity: Yes    Birth Control/ Protection: Post-menopausal   Other Topics Concern  . Not on file   Social History Narrative  . No narrative on file      Review of Systems  Constitutional: Negative for fever and chills.  Respiratory: Positive for cough. Negative for shortness of breath and wheezing.   Cardiovascular: Negative for chest pain.  Gastrointestinal: Negative for diarrhea and constipation.       Bowel incontinence  Musculoskeletal: Positive for back pain (chronic).         Objective:   Physical Exam CONSTITUTIONAL: Well developed/well nourished HEAD: Normocephalic/atraumatic EYES: EOMI/PERRL ENMT: Mucous membranes moist NECK: supple no meningeal signs SPINE:entire spine nontender CV: S1/S2 noted, no murmurs/rubs/gallops noted LUNGS: Good air exchange and bilateral significant wheezing with prolonged expiration. ABDOMEN: soft, nontender, no rebound or guarding GU:no cva tenderness NEURO: Pt is awake/alert, moves all extremitiesx4 EXTREMITIES: pulses normal, full ROM SKIN: warm, color normal PSYCH: no abnormalities of mood noted [facility] reading (PRIMARY) by  Dr.Zaylen Susman please comment on the lateral x-ray. Is there a retrocardiac density.. Results for orders placed in visit on 02/07/13  POCT CBC      Result Value Ref Range   WBC 10.0  4.6 - 10.2 K/uL   Lymph, poc 2.9  0.6 - 3.4   POC LYMPH PERCENT 29.3  10 - 50 %L   MID (cbc) 0.6  0 - 0.9   POC MID % 5.9  0 - 12 %M   POC Granulocyte 6.5  2 - 6.9   Granulocyte percent 64.8  37 - 80 %G   RBC 3.85 (*) 4.04 - 5.48 M/uL   Hemoglobin 11.5 (*) 12.2 - 16.2 g/dL   HCT, POC 38.0  37.7 - 47.9 %   MCV 98.7 (*) 80 - 97 fL   MCH, POC 29.9  27 - 31.2 pg   MCHC 30.3 (*) 31.8 - 35.4 g/dL   RDW, POC 14.2     Platelet Count, POC 439 (*) 142 - 424 K/uL   MPV 8.5  0 - 99.8 fL         Assessment & Plan:  Peak flow before albuterol and atrovent treatment was 375; should be 350. Peak flow after albuterol and atrovent treatment was 475. She definitely has bronchospasm. Her chest x-ray is abnormal with 2 opacities seen on the lateral. She will be treated with Symbicort 2 puffs twice a day as her inhaler she is placed on Omnicef 600 mg a day CT of the chest ordered information regarding smoking cessation was also given.

## 2013-10-18 ENCOUNTER — Other Ambulatory Visit: Payer: Self-pay | Admitting: *Deleted

## 2013-10-26 ENCOUNTER — Ambulatory Visit
Admission: RE | Admit: 2013-10-26 | Discharge: 2013-10-26 | Disposition: A | Discharge status: Home / Self Care | Payer: Commercial Managed Care - HMO | Source: Ambulatory Visit | Attending: Emergency Medicine | Admitting: Emergency Medicine

## 2013-10-26 DIAGNOSIS — R9389 Abnormal findings on diagnostic imaging of other specified body structures: Secondary | ICD-10-CM

## 2013-10-31 ENCOUNTER — Other Ambulatory Visit: Payer: Self-pay | Admitting: Radiology

## 2013-10-31 DIAGNOSIS — I2584 Coronary atherosclerosis due to calcified coronary lesion: Principal | ICD-10-CM

## 2013-10-31 DIAGNOSIS — I2510 Atherosclerotic heart disease of native coronary artery without angina pectoris: Secondary | ICD-10-CM

## 2013-11-20 ENCOUNTER — Other Ambulatory Visit: Payer: Self-pay | Admitting: Emergency Medicine

## 2013-11-20 ENCOUNTER — Ambulatory Visit (HOSPITAL_COMMUNITY)
Admission: RE | Admit: 2013-11-20 | Discharge: 2013-11-20 | Disposition: A | Discharge status: Home / Self Care | Payer: Medicare HMO | Source: Ambulatory Visit | Attending: Cardiology | Admitting: Cardiology

## 2013-11-20 ENCOUNTER — Ambulatory Visit (INDEPENDENT_AMBULATORY_CARE_PROVIDER_SITE_OTHER): Payer: Medicare HMO | Admitting: Emergency Medicine

## 2013-11-20 ENCOUNTER — Ambulatory Visit (INDEPENDENT_AMBULATORY_CARE_PROVIDER_SITE_OTHER): Payer: Medicare HMO

## 2013-11-20 VITALS — BP 182/90 | HR 71 | Temp 98.2°F | Resp 16 | Ht 64.5 in | Wt 194.0 lb

## 2013-11-20 DIAGNOSIS — M25562 Pain in left knee: Secondary | ICD-10-CM

## 2013-11-20 DIAGNOSIS — M79662 Pain in left lower leg: Secondary | ICD-10-CM

## 2013-11-20 DIAGNOSIS — M7989 Other specified soft tissue disorders: Secondary | ICD-10-CM

## 2013-11-20 DIAGNOSIS — M79669 Pain in unspecified lower leg: Secondary | ICD-10-CM | POA: Diagnosis present

## 2013-11-20 MED ORDER — HYDROCODONE-ACETAMINOPHEN 5-325 MG PO TABS: 1.0000 | ORAL_TABLET | ORAL | Status: DC | PRN

## 2013-11-20 NOTE — Progress Notes (Signed)
*  PRELIMINARY RESULTS* Vascular Ultrasound Left lower extremity venous duplex has been completed.  Preliminary findings: No evidence of DVT.   Called results to Dr. Everlene Farrier.   Landry Mellow, RDMS, RVT  11/20/2013, 4:14 PM

## 2013-11-20 NOTE — Progress Notes (Signed)
   Subjective:    Patient ID: [name], female    DOB: 1950/12/04, 63 y.o.   MRN: [id number] This chart was scribed for [name], MD by Marti Sleigh, Medical Scribe. This patient was seen in Room 4 and the patient's care was started at 10:50 AM.    HPI HPI Comments: [name] is a 63 y.o. female with a past hx of hip replacement and multiple back surgeries who presents to [facility] complaining of left leg and knee pain. Pt endorses knee and leg swelling as an associated symptom. Dr. Ninfa Linden performed her hip surgery at [facility]. Pt denies hx of blood clots, or calf pain. Pt is a smoker 1ppd for 45 years.    Review of Systems  Constitutional: Negative for fatigue.  Musculoskeletal: Positive for arthralgias.  Skin: Negative for rash.       Objective:   Physical Exam  Nursing note and vitals reviewed. Constitutional: She is oriented to person, place, and time. She appears well-developed and well-nourished.  HENT:  Head: Normocephalic and atraumatic.  Eyes: Pupils are equal, round, and reactive to light.  Neck: No JVD present.  Cardiovascular: Normal rate, regular rhythm and normal heart sounds.   Pulmonary/Chest: Effort normal and breath sounds normal. No respiratory distress.  Musculoskeletal: She exhibits tenderness.  Mild diffuse TPP over left knee, negative drawer sign, Lockman, McMurray's. Mild tenderness over mid to lower calf without swelling.  Neurological: She is alert and oriented to person, place, and time.  Skin: Skin is warm and dry.  Psychiatric: She has a normal mood and affect. Her behavior is normal.   [facility] reading (PRIMARY) by  Dr.Daub knee films are normal.        Assessment & Plan:  Knee films are normal. Referral made to Dr. Ninfa Linden for his evaluation. We'll go ahead and schedule a venous Doppler left leg rule out clot I personally performed the services described in this documentation, which was scribed in my presence. The  recorded information has been reviewed and is accurate.

## 2013-11-23 DIAGNOSIS — M51379 Other intervertebral disc degeneration, lumbosacral region without mention of lumbar back pain or lower extremity pain: Secondary | ICD-10-CM | POA: Insufficient documentation

## 2013-12-01 ENCOUNTER — Encounter: Payer: Self-pay | Admitting: Emergency Medicine

## 2013-12-02 ENCOUNTER — Other Ambulatory Visit: Payer: Self-pay | Admitting: Emergency Medicine

## 2013-12-02 DIAGNOSIS — M25562 Pain in left knee: Secondary | ICD-10-CM

## 2013-12-02 MED ORDER — HYDROCODONE-ACETAMINOPHEN 5-325 MG PO TABS: 1.0000 | ORAL_TABLET | ORAL | Status: DC | PRN

## 2013-12-26 ENCOUNTER — Telehealth: Payer: Self-pay | Admitting: Radiology

## 2013-12-26 NOTE — Telephone Encounter (Signed)
LMVM to CB to schedule appt with Dr. Everlene Farrier.

## 2013-12-26 NOTE — Telephone Encounter (Signed)
Dr Everlene Farrier wants patient to make follow up appointment, next available,

## 2013-12-27 ENCOUNTER — Encounter: Payer: Self-pay | Admitting: Emergency Medicine

## 2013-12-29 NOTE — Telephone Encounter (Signed)
Pt called back, Dr. Caren Griffins next available appt. Wasn't until 12/17 Pt states she would come in this Saturday to the walk in center.

## 2013-12-30 ENCOUNTER — Ambulatory Visit (INDEPENDENT_AMBULATORY_CARE_PROVIDER_SITE_OTHER): Payer: Medicare HMO | Admitting: Emergency Medicine

## 2013-12-30 VITALS — BP 146/82 | HR 86 | Temp 97.7°F | Resp 18 | Ht 65.0 in | Wt 194.2 lb

## 2013-12-30 DIAGNOSIS — E785 Hyperlipidemia, unspecified: Secondary | ICD-10-CM

## 2013-12-30 DIAGNOSIS — B1710 Acute hepatitis C without hepatic coma: Secondary | ICD-10-CM

## 2013-12-30 DIAGNOSIS — Z1211 Encounter for screening for malignant neoplasm of colon: Secondary | ICD-10-CM

## 2013-12-30 DIAGNOSIS — M25562 Pain in left knee: Secondary | ICD-10-CM

## 2013-12-30 LAB — POCT CBC
GRANULOCYTE PERCENT: 55.7 % (ref 37–80)
HCT, POC: 40.9 % (ref 37.7–47.9)
Hemoglobin: 13.4 g/dL (ref 12.2–16.2)
Lymph, poc: 2.8 (ref 0.6–3.4)
MCH, POC: 29.9 pg (ref 27–31.2)
MCHC: 32.8 g/dL (ref 31.8–35.4)
MCV: 91 fL (ref 80–97)
MID (cbc): 0.4 (ref 0–0.9)
MPV: 7.6 fL (ref 0–99.8)
PLATELET COUNT, POC: 212 10*3/uL (ref 142–424)
POC Granulocyte: 4.1 (ref 2–6.9)
POC LYMPH PERCENT: 38.4 %L (ref 10–50)
POC MID %: 5.9 % (ref 0–12)
RBC: 4.5 M/uL (ref 4.04–5.48)
RDW, POC: 13.8 %
WBC: 7.4 10*3/uL (ref 4.6–10.2)

## 2013-12-30 LAB — COMPLETE METABOLIC PANEL WITH GFR
ALBUMIN: 4.2 g/dL (ref 3.5–5.2)
ALT: 23 U/L (ref 0–35)
AST: 24 U/L (ref 0–37)
Alkaline Phosphatase: 105 U/L (ref 39–117)
BUN: 17 mg/dL (ref 6–23)
CHLORIDE: 107 meq/L (ref 96–112)
CO2: 24 mEq/L (ref 19–32)
Calcium: 9.5 mg/dL (ref 8.4–10.5)
Creat: 0.6 mg/dL (ref 0.50–1.10)
GFR, Est African American: >89 mL/min
GFR, Est Non African American: >89 mL/min
Glucose, Bld: 85 mg/dL (ref 70–99)
POTASSIUM: 4.1 meq/L (ref 3.5–5.3)
SODIUM: 140 meq/L (ref 135–145)
Total Bilirubin: 0.4 mg/dL (ref 0.2–1.2)
Total Protein: 7.4 g/dL (ref 6.0–8.3)

## 2013-12-30 MED ORDER — HYDROCODONE-ACETAMINOPHEN 5-325 MG PO TABS: 1.0000 | ORAL_TABLET | ORAL | Status: DC | PRN

## 2013-12-30 NOTE — Progress Notes (Signed)
Subjective:    Patient ID: [name], female    DOB: 12-16-1950, 63 y.o.   MRN: [id number] This chart was scribed for [name], MD by Cathie Hoops, ED Scribe. The patient was seen in Room 3. The patient's care was started at 10:59 AM.   12/30/2013  Chief Complaint  Patient presents with  . Follow-up  . Hep C    HPI HPI Comments: [name] is a 63 y.o. female who presents to the [facility] complaining of chronic Hepatitis C. Pt completed a treatment program in [place] approximately 2 years ago. Pt notes she did oral treatment for 6 months. She denies following-up with them. Pt was told to follow-up with Dr. Everlene Farrier to check her Hepatitis C viral load. Pt went to her cardiologist and was put on aspirin daily due to high. She notes her stress test was normal. Pt notes her chronic back pain has recently increased due to her medication losing it's effectiveness. She last saw Dr. Saintclair Halsted one month ago. Pt has previously had 5 surgeries for her back. She has previously had a colonoscopy and notes they saw small polyps but they were removed. Pt denies having a colonoscopy. Pt denies having any family members with history of colon cancer. She denies ever seeing a GI doctor. Pt has received her flu vaccine, shingles vaccine and pneumonia vaccine. She denies any other symptoms at this time.  Review of Systems  Constitutional: Negative for fever and chills.  Musculoskeletal: Positive for back pain.    Past Medical History  Diagnosis Date  . Chronic bronchitis     "when I get a cold I normally get bronchitis" (01/30/2013)  . Hepatitis C    Past Surgical History  Procedure Laterality Date  . Back surgery      x5  . Colonoscopy    . Joint replacement Left 01/24/2013    hip  . Total hip arthroplasty Left 01/24/2013    Procedure: LEFT TOTAL HIP ARTHROPLASTY ANTERIOR APPROACH;  Surgeon: Mcarthur Rossetti, MD;  Location: [facility];  Service: Orthopedics;   Laterality: Left;  . Posterior lumbar fusion  2006 X 2; 2008  . Lumbar spine surgery  2010; 2011    "tightened up some screws" (01/30/2013)  . Abdominal hysterectomy  1990's  . Tubal ligation  1970's  . Cholecystectomy  1990's   No Known Allergies Current Outpatient Prescriptions  Medication Sig Dispense Refill  . aspirin 81 MG tablet Take 81 mg by mouth daily.    . diclofenac (VOLTAREN) 75 MG EC tablet Take 75 mg by mouth 2 (two) times daily.    . simvastatin (ZOCOR) 40 MG tablet Take 40 mg by mouth daily.    . budesonide-formoterol (SYMBICORT) 160-4.5 MCG/ACT inhaler Inhale 2 puffs into the lungs 2 (two) times daily. 1 Inhaler 5  . cyclobenzaprine (FLEXERIL) 10 MG tablet Take 1 tablet (10 mg total) by mouth 2 (two) times daily as needed for muscle spasms. 60 tablet 1  . HYDROcodone-acetaminophen (NORCO/VICODIN) 5-325 MG per tablet Take 1 tablet by mouth every 4 (four) hours as needed for moderate pain or severe pain. 15 tablet 0  . ibuprofen (ADVIL,MOTRIN) 800 MG tablet Take 1 tablet (800 mg total) by mouth 3 (three) times daily. Take with food 21 tablet 0   No current facility-administered medications for this visit.       Objective:  Triage Vitals: BP 146/82 mmHg  Pulse 86  Temp(Src) 97.7 F (36.5 C) (  Oral)  Resp 18  Ht 5\' 5"  (1.651 m)  Wt 194 lb 3.2 oz (88.089 kg)  BMI 32.32 kg/m2  SpO2 99%  Physical Exam  Constitutional: She is oriented to person, place, and time. She appears well-developed and well-nourished. No distress.  HENT:  Head: Normocephalic and atraumatic.  Eyes: Conjunctivae and EOM are normal.  Neck: Neck supple. No tracheal deviation present.  Cardiovascular: Normal rate.   Pulmonary/Chest: Effort normal. No respiratory distress.  Musculoskeletal: Normal range of motion.  Neurological: She is alert and oriented to person, place, and time.  Skin: Skin is warm and dry.  Psychiatric: She has a normal mood and affect. Her behavior is normal.  Nursing note  and vitals reviewed.  Results for orders placed or performed in visit on 12/30/13  POCT CBC  Result Value Ref Range   WBC 7.4 4.6 - 10.2 K/uL   Lymph, poc 2.8 0.6 - 3.4   POC LYMPH PERCENT 38.4 10 - 50 %L   MID (cbc) 0.4 0 - 0.9   POC MID % 5.9 0 - 12 %M   POC Granulocyte 4.1 2 - 6.9   Granulocyte percent 55.7 37 - 80 %G   RBC 4.50 4.04 - 5.48 M/uL   Hemoglobin 13.4 12.2 - 16.2 g/dL   HCT, POC 40.9 37.7 - 47.9 %   MCV 91.0 80 - 97 fL   MCH, POC 29.9 27 - 31.2 pg   MCHC 32.8 31.8 - 35.4 g/dL   RDW, POC 13.8 %   Platelet Count, POC 212 142 - 424 K/uL   MPV 7.6 0 - 99.8 fL    Assessment & Plan:  11:04 AM- Patient informed of current plan for treatment and evaluation and agrees with plan at this time. Routine labs were done today. I gave her #10 hydrocodone to have until she follows up with Dr. Weston Settle. Patient had her flu shot at [facility]. Referral made back to Dr. Lu Duffel guide in that the last note says colonoscopy was done in 2008 to be repeated in 5 years. She was given #10 hydrocodone to take sparingly until she discusses her situation with Dr. Weston Settle

## 2014-01-02 LAB — HEPATITIS C RNA QUANTITATIVE: HCV Quantitative: NOT DETECTED IU/mL (ref <15)

## 2014-03-08 DIAGNOSIS — M5137 Other intervertebral disc degeneration, lumbosacral region: Secondary | ICD-10-CM | POA: Diagnosis not present

## 2014-03-08 DIAGNOSIS — R030 Elevated blood-pressure reading, without diagnosis of hypertension: Secondary | ICD-10-CM | POA: Diagnosis not present

## 2014-03-08 DIAGNOSIS — Z6831 Body mass index (BMI) 31.0-31.9, adult: Secondary | ICD-10-CM | POA: Diagnosis not present

## 2014-04-26 ENCOUNTER — Other Ambulatory Visit: Payer: Self-pay

## 2014-04-26 DIAGNOSIS — Z1231 Encounter for screening mammogram for malignant neoplasm of breast: Secondary | ICD-10-CM

## 2014-05-22 ENCOUNTER — Ambulatory Visit
Admission: RE | Admit: 2014-05-22 | Discharge: 2014-05-22 | Disposition: A | Discharge status: Home / Self Care | Payer: Commercial Managed Care - HMO | Source: Ambulatory Visit

## 2014-05-22 DIAGNOSIS — Z1231 Encounter for screening mammogram for malignant neoplasm of breast: Secondary | ICD-10-CM | POA: Diagnosis not present

## 2014-06-07 ENCOUNTER — Telehealth: Payer: Self-pay | Admitting: Emergency Medicine

## 2014-06-07 DIAGNOSIS — M48061 Spinal stenosis, lumbar region without neurogenic claudication: Secondary | ICD-10-CM | POA: Insufficient documentation

## 2014-06-07 DIAGNOSIS — M4806 Spinal stenosis, lumbar region: Secondary | ICD-10-CM | POA: Diagnosis not present

## 2014-06-07 DIAGNOSIS — M5136 Other intervertebral disc degeneration, lumbar region: Secondary | ICD-10-CM | POA: Diagnosis not present

## 2014-06-28 DIAGNOSIS — H5210 Myopia, unspecified eye: Secondary | ICD-10-CM | POA: Diagnosis not present

## 2014-06-28 DIAGNOSIS — H52203 Unspecified astigmatism, bilateral: Secondary | ICD-10-CM | POA: Diagnosis not present

## 2014-07-20 ENCOUNTER — Encounter: Payer: Self-pay | Admitting: Emergency Medicine

## 2014-07-20 ENCOUNTER — Other Ambulatory Visit: Payer: Self-pay | Admitting: Emergency Medicine

## 2014-07-20 DIAGNOSIS — M21612 Bunion of left foot: Principal | ICD-10-CM

## 2014-07-20 DIAGNOSIS — M21611 Bunion of right foot: Secondary | ICD-10-CM

## 2014-08-03 ENCOUNTER — Ambulatory Visit (INDEPENDENT_AMBULATORY_CARE_PROVIDER_SITE_OTHER): Payer: Commercial Managed Care - HMO | Admitting: Podiatry

## 2014-08-03 DIAGNOSIS — Q667 Congenital pes cavus: Secondary | ICD-10-CM | POA: Diagnosis not present

## 2014-08-03 DIAGNOSIS — L84 Corns and callosities: Secondary | ICD-10-CM | POA: Diagnosis not present

## 2014-08-03 DIAGNOSIS — M2010 Hallux valgus (acquired), unspecified foot: Secondary | ICD-10-CM | POA: Diagnosis not present

## 2014-08-03 DIAGNOSIS — M216X9 Other acquired deformities of unspecified foot: Secondary | ICD-10-CM

## 2014-08-03 NOTE — Progress Notes (Signed)
   Subjective:    Patient ID: [name], female    DOB: 09-Mar-1950, 64 y.o.   MRN: [id number]  HPI Pt presents with painful right foot callus on lateral side and great toe   Review of Systems  All other systems reviewed and are negative.      Objective:   Physical Exam        Assessment & Plan:

## 2014-08-04 NOTE — Progress Notes (Signed)
Subjective:     Patient ID: [name], female   DOB: September 30, 1950, 64 y.o.   MRN: [id number]  HPI patient presents with structural deformity right over left foot medial aspect first metatarsal head with keratotic lesion noted that's painful on the medial side of the big toe bilateral right over left with pain when palpated   Review of Systems  All other systems reviewed and are negative.      Objective:   Physical Exam  Constitutional: She is oriented to person, place, and time.  Cardiovascular: Intact distal pulses.   Musculoskeletal: Normal range of motion.  Neurological: She is oriented to person, place, and time.  Skin: Skin is warm.  Nursing note and vitals reviewed.  neurovascular status intact muscle strength adequate with range of motion subtalar midtarsal joint within normal limits. Patient's found to have good digital perfusion is well oriented 3 with hyperostosis medial aspect first metatarsal head right over left with pain when palpated and keratotic lesion on the hallux bilateral     Assessment:     Structural deformity noted with digital deformities and painful callus formation along with plantarflexed metatarsal    Plan:     H&P and x-rays reviewed. Debrided lesions and applied padding and instructed on supportive shoe and do not recommend surgery currently and less symptoms were to worsen

## 2014-09-05 IMAGING — CR DG ABDOMEN 1V
1 series · 1 of 1 positions shown · non-contrast
Comparison: January 31, 2013

CLINICAL DATA: Bowel obstruction

EXAM:
ABDOMEN - 1 VIEW

[t abdomen supine]
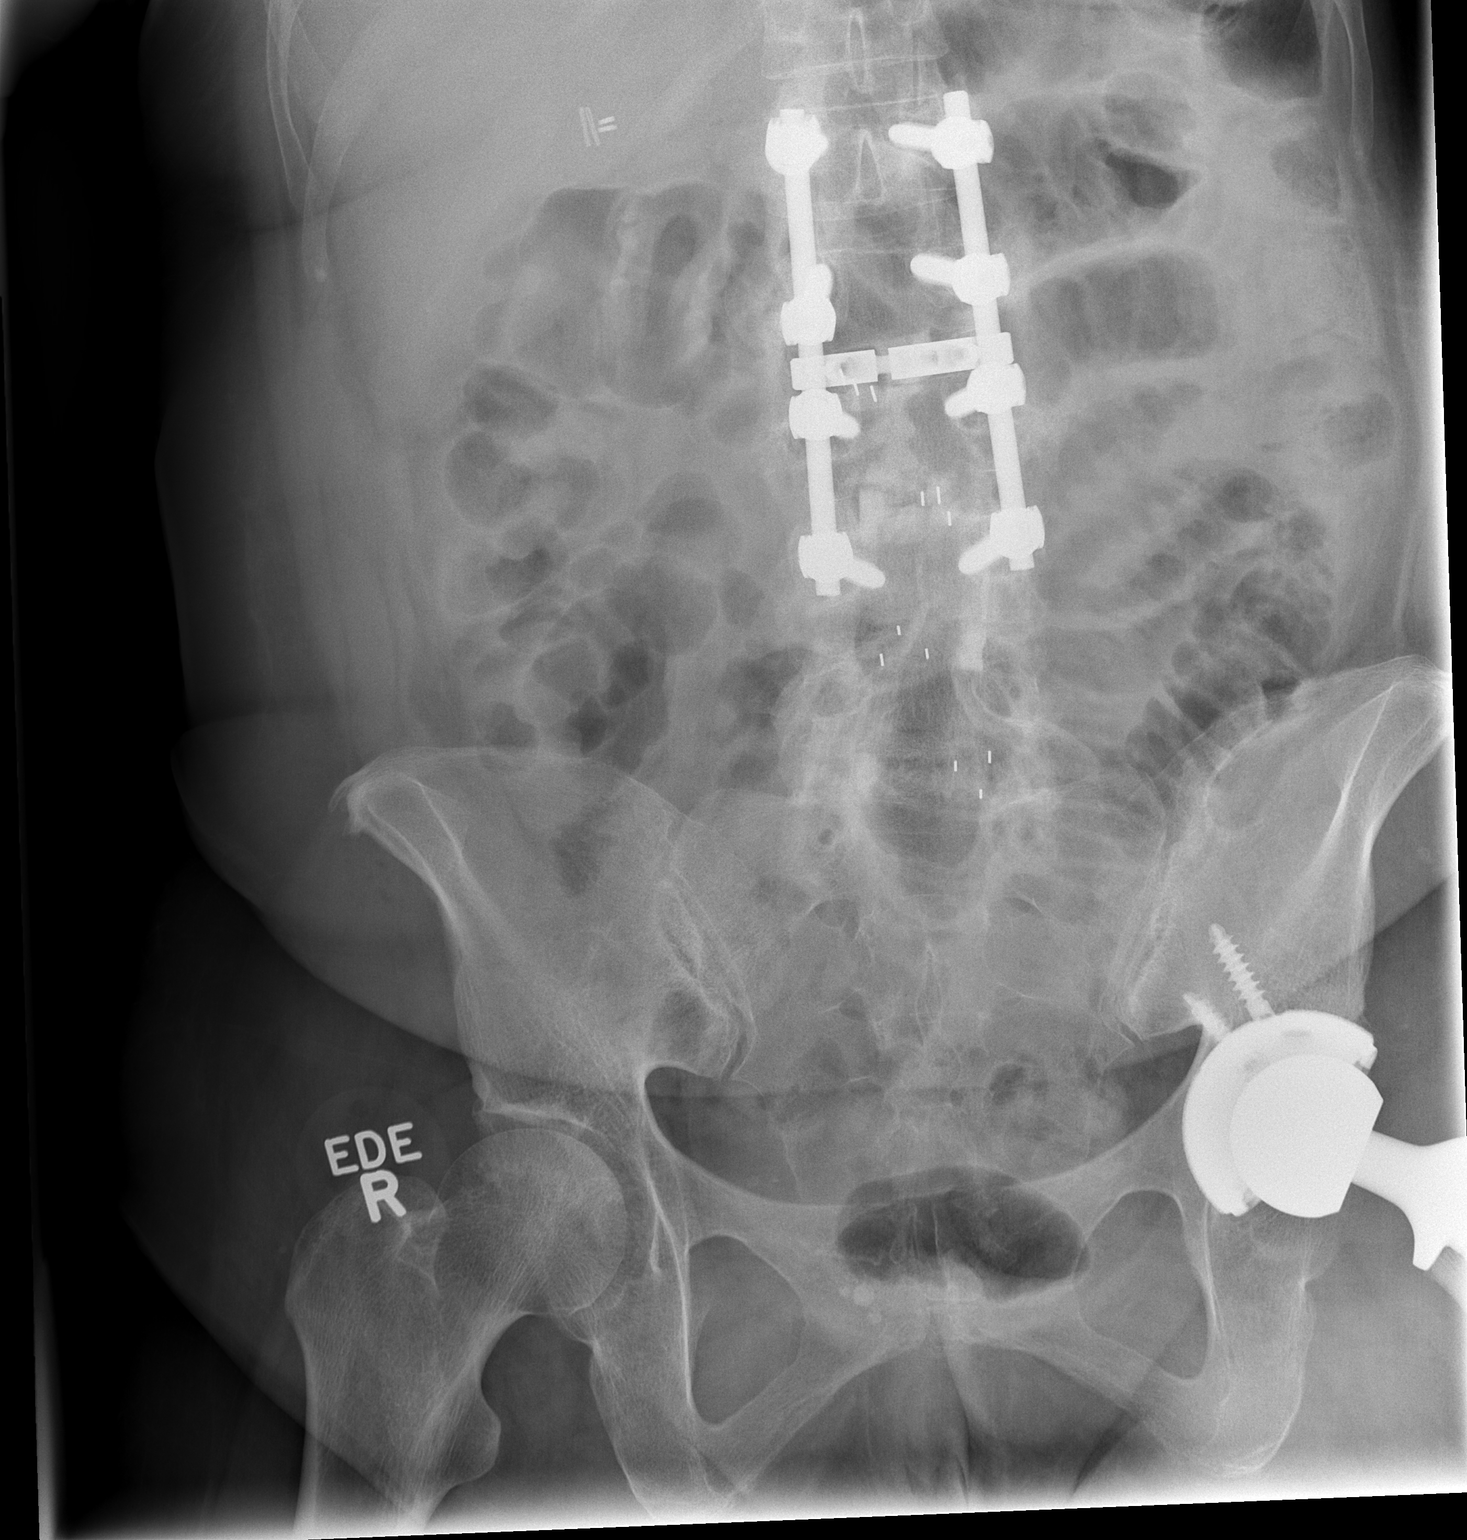

[1 of 1 positions shown; findings below may reference images not displayed]

FINDINGS: There is less bowel dilatation compared to 1 day prior. Multiple
small bowel loops do appear mildly thickened. No free air is seen.
No intramural air or portal venous air is seen on this examination.
There is a total hip prosthesis on the left. There is postoperative
change in the lumbar spine. There are also surgical clips in the
gallbladder fossa region.
IMPRESSION: Slightly less bowel dilatation compared to 1 day prior. Several
small bowel loops appear mildly thickened. This is a finding that
can be associated with bowel ischemia; close clinical and imaging
surveillance remain advised given this possibility. No free air is
seen on this supine examination. Multiple areas of postoperative
change.

## 2014-09-11 ENCOUNTER — Ambulatory Visit: Payer: Self-pay | Admitting: Emergency Medicine

## 2014-09-11 DIAGNOSIS — M5137 Other intervertebral disc degeneration, lumbosacral region: Secondary | ICD-10-CM | POA: Diagnosis not present

## 2014-10-18 ENCOUNTER — Telehealth: Payer: Self-pay

## 2014-10-18 NOTE — Telephone Encounter (Signed)
Pt needs a referral from Brigitte Pulse so she can go to her neurologist ?  Please advise [phone]

## 2014-11-02 ENCOUNTER — Encounter: Payer: Self-pay | Admitting: *Deleted

## 2014-11-13 ENCOUNTER — Encounter: Payer: Self-pay | Admitting: Emergency Medicine

## 2014-11-19 ENCOUNTER — Encounter: Payer: Self-pay | Admitting: Emergency Medicine

## 2014-11-20 ENCOUNTER — Encounter: Payer: Self-pay | Admitting: Emergency Medicine

## 2014-11-21 ENCOUNTER — Ambulatory Visit (INDEPENDENT_AMBULATORY_CARE_PROVIDER_SITE_OTHER): Payer: Commercial Managed Care - HMO | Admitting: Emergency Medicine

## 2014-11-21 VITALS — BP 148/78 | HR 70 | Temp 98.2°F | Resp 18 | Wt 189.0 lb

## 2014-11-21 DIAGNOSIS — I10 Essential (primary) hypertension: Secondary | ICD-10-CM | POA: Diagnosis not present

## 2014-11-21 DIAGNOSIS — E785 Hyperlipidemia, unspecified: Secondary | ICD-10-CM | POA: Diagnosis not present

## 2014-11-21 DIAGNOSIS — M549 Dorsalgia, unspecified: Secondary | ICD-10-CM | POA: Diagnosis not present

## 2014-11-21 LAB — POCT CBC
Granulocyte percent: 48.5 %G (ref 37–80)
HEMATOCRIT: 38.5 % (ref 37.7–47.9)
Hemoglobin: 13.3 g/dL (ref 12.2–16.2)
Lymph, poc: 4.1 — AB (ref 0.6–3.4)
MCH, POC: 30.7 pg (ref 27–31.2)
MCHC: 34.6 g/dL (ref 31.8–35.4)
MCV: 88.7 fL (ref 80–97)
MID (cbc): 0.4 (ref 0–0.9)
MPV: 7.3 fL (ref 0–99.8)
POC GRANULOCYTE: 4.3 (ref 2–6.9)
POC LYMPH PERCENT: 46.5 %L (ref 10–50)
POC MID %: 5 %M (ref 0–12)
Platelet Count, POC: 237 10*3/uL (ref 142–424)
RBC: 4.34 M/uL (ref 4.04–5.48)
RDW, POC: 13.3 %
WBC: 8.9 10*3/uL (ref 4.6–10.2)

## 2014-11-21 MED ORDER — SIMVASTATIN 40 MG PO TABS: 40.0000 mg | ORAL_TABLET | Freq: Every day | ORAL | Status: DC

## 2014-11-21 MED ORDER — HYDROCHLOROTHIAZIDE 12.5 MG PO CAPS: 12.5000 mg | ORAL_CAPSULE | Freq: Every day | ORAL | Status: DC

## 2014-11-21 NOTE — Progress Notes (Addendum)
Patient ID: [name], female   DOB: 08/25/1950, 64 y.o.   MRN: [id number]    By signing my name below, I, [name], attest that this documentation has been prepared under the direction and in the presence of [name], MD Electronically Signed: Ladene Artist, ED Scribe 11/21/2014 at 5:17 PM.  Chief Complaint:  Chief Complaint  Patient presents with  . Follow-up  . Hypertension  . Hyperlipidemia  . Referral    home health    HPI: ASA FATH is a 64 y.o. female, with a h/o Hepatitis C, who reports to [facility] today complaining of elevated bloop pressure for the past 6 months. Pt reports associated diaphoresis and hot flashes. She states that she has taken her husband's metoprolol 50 since she has never been on BP medications herself. Pt denies dietary changes and leg swelling.   Ambulation  Pt reports 5 previous back surgeries and a total left hip replacement in December 2014. She ambulates with a cane sometimes and a walker at other times. Pt states that she has been able to drive, dress and feed herself but needs assistance with reaching. She requests a referral for home health at this visit.   Past Medical History  Diagnosis Date  . Chronic bronchitis ([place])     "when I get a cold I normally get bronchitis" (01/30/2013)  . Hepatitis C    Past Surgical History  Procedure Laterality Date  . Back surgery      x5  . Colonoscopy    . Joint replacement Left 01/24/2013    hip  . Total hip arthroplasty Left 01/24/2013    Procedure: LEFT TOTAL HIP ARTHROPLASTY ANTERIOR APPROACH;  Surgeon: Mcarthur Rossetti, MD;  Location: [facility];  Service: Orthopedics;  Laterality: Left;  . Posterior lumbar fusion  2006 X 2; 2008  . Lumbar spine surgery  2010; 2011    "tightened up some screws" (01/30/2013)  . Abdominal hysterectomy  1990's  . Tubal ligation  1970's  . Cholecystectomy  1990's   Social History   Social History  . Marital Status: Married    Spouse Name: N/A    . Number of Children: N/A  . Years of Education: N/A   Social History Main Topics  . Smoking status: Current Every Day Smoker -- 1.00 packs/day for 45 years    Types: Cigarettes  . Smokeless tobacco: Never Used  . Alcohol Use: 0.0 oz/week    0 Standard drinks or equivalent per week     Comment: 01/30/2013 "used to drink beer; none since ~ 1989"  . Drug Use: No  . Sexual Activity: Yes    Birth Control/ Protection: Post-menopausal   Other Topics Concern  . None   Social History Narrative   History reviewed. No pertinent family history. No Known Allergies Prior to Admission medications   Medication Sig Start Date End Date Taking? Authorizing Provider  aspirin 81 MG tablet Take 81 mg by mouth daily.   Yes Historical Provider, MD  HYDROcodone-acetaminophen (NORCO/VICODIN) 5-325 MG per tablet Take 1 tablet by mouth every 4 (four) hours as needed for moderate pain or severe pain. 12/30/13  Yes [name], MD  budesonide-formoterol (SYMBICORT) 160-4.5 MCG/ACT inhaler Inhale 2 puffs into the lungs 2 (two) times daily. Patient not taking: Reported on 11/21/2014 10/14/13   [name], MD  cyclobenzaprine (FLEXERIL) 10 MG tablet Take 1 tablet (10 mg total) by mouth 2 (two) times daily as needed for muscle spasms. Patient  not taking: Reported on 11/21/2014 10/14/13   [name], MD  diclofenac (VOLTAREN) 75 MG EC tablet Take 75 mg by mouth 2 (two) times daily.    Historical Provider, MD  ibuprofen (ADVIL,MOTRIN) 800 MG tablet Take 1 tablet (800 mg total) by mouth 3 (three) times daily. Take with food Patient not taking: Reported on 11/21/2014 07/17/13   Harvie Heck, PA-C  simvastatin (ZOCOR) 40 MG tablet Take 40 mg by mouth daily.    Historical Provider, MD     ROS: The patient denies fevers, chills, night sweats, unintentional weight loss, chest pain, palpitations, wheezing, dyspnea on exertion, leg swelling, nausea, vomiting, abdominal pain, dysuria, hematuria, melena, numbness,  weakness, or tingling. +diaphoresis, +hot flashes  All other systems have been reviewed and were otherwise negative with the exception of those mentioned in the HPI and as above.    PHYSICAL EXAM: Filed Vitals:   11/21/14 1651  BP: 148/78  Pulse: 70  Temp: 98.2 F (36.8 C)  Resp: 18   Body mass index is 31.45 kg/(m^2).  General: Alert, no acute distress. Pt is seated but has a walker for assistance.  HEENT:  Normocephalic, atraumatic, oropharynx patent. Eye: Juliette Mangle [facility] Cardiovascular:  Regular rate and rhythm. 1/6 systolic murmur in L sternal border. No rubs or gallops. No Carotid bruits, radial pulse intact. No pedal edema.  Respiratory: Clear to auscultation bilaterally. No wheezes, rales, or rhonchi. No cyanosis, no use of accessory musculature Abdominal: No organomegaly, abdomen is soft and non-tender, positive bowel sounds. No masses. Musculoskeletal: Gait intact. No edema in LE. No tenderness.  Skin: No rashes. Neurologic: Facial musculature symmetric. Psychiatric: Patient acts appropriately throughout our interaction. Lymphatic: No cervical or submandibular lymphadenopathy  LABS:  EKG/XRAY:   Primary read interpreted by Dr. Everlene Farrier at [facility]. Sinus bradycardia no acute changes  ASSESSMENT/PLAN: Home health referral made. I refilled her simvastatin. I started her on HCTZ 1 a day.I personally performed the services described in this documentation, which was scribed in my presence. The recorded information has been reviewed and is accurate.  Gross sideeffects, risk and benefits, and alternatives of medications d/w patient. Patient is aware that all medications have potential sideeffects and we are unable to predict every sideeffect or drug-drug interaction that may occur.  [name] MD 11/21/2014 5:17 PM

## 2014-11-21 NOTE — Patient Instructions (Addendum)
Take your blood pressure pill every morning. Take your cholesterol medication as prescribed. I will contact home health about trying to get you some assistance at home. DASH Eating Plan DASH stands for "Dietary Approaches to Stop Hypertension." The DASH eating plan is a healthy eating plan that has been shown to reduce high blood pressure (hypertension). Additional health benefits may include reducing the risk of type 2 diabetes mellitus, heart disease, and stroke. The DASH eating plan may also help with weight loss. WHAT DO I NEED TO KNOW ABOUT THE DASH EATING PLAN? For the DASH eating plan, you will follow these general guidelines:  Choose foods with a percent daily value for sodium of less than 5% (as listed on the food label).  Use salt-free seasonings or herbs instead of table salt or sea salt.  Check with your health care provider or pharmacist before using salt substitutes.  Eat lower-sodium products, often labeled as "lower sodium" or "no salt added."  Eat fresh foods.  Eat more vegetables, fruits, and low-fat dairy products.  Choose whole grains. Look for the word "whole" as the first word in the ingredient list.  Choose fish and skinless chicken or [place] more often than red meat. Limit fish, poultry, and meat to 6 oz (170 g) each day.  Limit sweets, desserts, sugars, and sugary drinks.  Choose heart-healthy fats.  Limit cheese to 1 oz (28 g) per day.  Eat more home-cooked food and less restaurant, buffet, and fast food.  Limit fried foods.  Cook foods using methods other than frying.  Limit canned vegetables. If you do use them, rinse them well to decrease the sodium.  When eating at a restaurant, ask that your food be prepared with less salt, or no salt if possible. WHAT FOODS CAN I EAT? Seek help from a dietitian for individual calorie needs. Grains Whole grain or whole wheat bread. Brown rice. Whole grain or whole wheat pasta. Quinoa, bulgur, and whole grain  cereals. Low-sodium cereals. Corn or whole wheat flour tortillas. Whole grain cornbread. Whole grain crackers. Low-sodium crackers. Vegetables Fresh or frozen vegetables (raw, steamed, roasted, or grilled). Low-sodium or reduced-sodium tomato and vegetable juices. Low-sodium or reduced-sodium tomato sauce and paste. Low-sodium or reduced-sodium canned vegetables.  Fruits All fresh, canned (in natural juice), or frozen fruits. Meat and Other Protein Products Ground beef (85% or leaner), grass-fed beef, or beef trimmed of fat. Skinless chicken or [place]. Ground chicken or [place]. Pork trimmed of fat. All fish and seafood. Eggs. Dried beans, peas, or lentils. Unsalted nuts and seeds. Unsalted canned beans. Dairy Low-fat dairy products, such as skim or 1% milk, 2% or reduced-fat cheeses, low-fat ricotta or cottage cheese, or plain low-fat yogurt. Low-sodium or reduced-sodium cheeses. Fats and Oils Tub margarines without trans fats. Light or reduced-fat mayonnaise and salad dressings (reduced sodium). Avocado. Safflower, olive, or canola oils. Natural peanut or almond butter. Other Unsalted popcorn and pretzels. The items listed above may not be a complete list of recommended foods or beverages. Contact your dietitian for more options. WHAT FOODS ARE NOT RECOMMENDED? Grains White bread. White pasta. White rice. Refined cornbread. Bagels and croissants. Crackers that contain trans fat. Vegetables Creamed or fried vegetables. Vegetables in a cheese sauce. Regular canned vegetables. Regular canned tomato sauce and paste. Regular tomato and vegetable juices. Fruits Dried fruits. Canned fruit in light or heavy syrup. Fruit juice. Meat and Other Protein Products Fatty cuts of meat. Ribs, chicken wings, bacon, sausage, bologna, salami, chitterlings, fatback, hot dogs, bratwurst,  and packaged luncheon meats. Salted nuts and seeds. Canned beans with salt. Dairy Whole or 2% milk, cream, half-and-half, and  cream cheese. Whole-fat or sweetened yogurt. Full-fat cheeses or blue cheese. Nondairy creamers and whipped toppings. Processed cheese, cheese spreads, or cheese curds. Condiments Onion and garlic salt, seasoned salt, table salt, and sea salt. Canned and packaged gravies. Worcestershire sauce. Tartar sauce. Barbecue sauce. Teriyaki sauce. Soy sauce, including reduced sodium. Steak sauce. Fish sauce. Oyster sauce. Cocktail sauce. Horseradish. Ketchup and mustard. Meat flavorings and tenderizers. Bouillon cubes. Hot sauce. Tabasco sauce. Marinades. Taco seasonings. Relishes. Fats and Oils Butter, stick margarine, lard, shortening, ghee, and bacon fat. Coconut, palm kernel, or palm oils. Regular salad dressings. Other Pickles and olives. Salted popcorn and pretzels. The items listed above may not be a complete list of foods and beverages to avoid. Contact your dietitian for more information. WHERE CAN I FIND MORE INFORMATION? National Heart, Lung, and Blood Institute: travelstabloid.com   This information is not intended to replace advice given to you by your health care provider. Make sure you discuss any questions you have with your health care provider.   Document Released: 01/15/2011 Document Revised: 02/16/2014 Document Reviewed: 11/30/2012 Elsevier Interactive Patient Education [employer].

## 2014-11-22 LAB — COMPLETE METABOLIC PANEL WITH GFR
ALT: 20 U/L (ref 6–29)
AST: 23 U/L (ref 10–35)
Albumin: 4.5 g/dL (ref 3.6–5.1)
Alkaline Phosphatase: 112 U/L (ref 33–130)
BUN: 18 mg/dL (ref 7–25)
CHLORIDE: 108 mmol/L (ref 98–110)
CO2: 29 mmol/L (ref 20–31)
CREATININE: 0.64 mg/dL (ref 0.50–0.99)
Calcium: 9.5 mg/dL (ref 8.6–10.4)
GFR, Est African American: >89 mL/min (ref >=60)
GFR, Est Non African American: >89 mL/min (ref >=60)
Glucose, Bld: 89 mg/dL (ref 65–99)
Potassium: 4.3 mmol/L (ref 3.5–5.3)
Sodium: 140 mmol/L (ref 135–146)
Total Bilirubin: 0.3 mg/dL (ref 0.2–1.2)
Total Protein: 7.8 g/dL (ref 6.1–8.1)

## 2014-11-22 LAB — LIPID PANEL
Cholesterol: 168 mg/dL (ref 125–200)
HDL: 40 mg/dL — ABNORMAL LOW (ref >=46)
LDL CALC: 103 mg/dL (ref <130)
Total CHOL/HDL Ratio: 4.2 Ratio (ref <=5.0)
Triglycerides: 127 mg/dL (ref <150)
VLDL: 25 mg/dL (ref <30)

## 2014-11-23 DIAGNOSIS — R2689 Other abnormalities of gait and mobility: Secondary | ICD-10-CM | POA: Diagnosis not present

## 2014-11-23 DIAGNOSIS — Z96642 Presence of left artificial hip joint: Secondary | ICD-10-CM | POA: Diagnosis not present

## 2014-11-23 DIAGNOSIS — Z9181 History of falling: Secondary | ICD-10-CM | POA: Diagnosis not present

## 2014-11-23 DIAGNOSIS — J42 Unspecified chronic bronchitis: Secondary | ICD-10-CM | POA: Diagnosis not present

## 2014-11-23 DIAGNOSIS — F17210 Nicotine dependence, cigarettes, uncomplicated: Secondary | ICD-10-CM | POA: Diagnosis not present

## 2014-11-23 DIAGNOSIS — M549 Dorsalgia, unspecified: Secondary | ICD-10-CM | POA: Diagnosis not present

## 2014-11-23 DIAGNOSIS — I10 Essential (primary) hypertension: Secondary | ICD-10-CM | POA: Diagnosis not present

## 2014-11-28 DIAGNOSIS — M549 Dorsalgia, unspecified: Secondary | ICD-10-CM | POA: Diagnosis not present

## 2014-11-28 DIAGNOSIS — R2689 Other abnormalities of gait and mobility: Secondary | ICD-10-CM | POA: Diagnosis not present

## 2014-11-28 DIAGNOSIS — Z9181 History of falling: Secondary | ICD-10-CM | POA: Diagnosis not present

## 2014-11-28 DIAGNOSIS — F17210 Nicotine dependence, cigarettes, uncomplicated: Secondary | ICD-10-CM | POA: Diagnosis not present

## 2014-11-28 DIAGNOSIS — J42 Unspecified chronic bronchitis: Secondary | ICD-10-CM | POA: Diagnosis not present

## 2014-11-28 DIAGNOSIS — I10 Essential (primary) hypertension: Secondary | ICD-10-CM | POA: Diagnosis not present

## 2014-11-28 DIAGNOSIS — Z96642 Presence of left artificial hip joint: Secondary | ICD-10-CM | POA: Diagnosis not present

## 2014-11-29 DIAGNOSIS — M549 Dorsalgia, unspecified: Secondary | ICD-10-CM | POA: Diagnosis not present

## 2014-11-29 DIAGNOSIS — I10 Essential (primary) hypertension: Secondary | ICD-10-CM | POA: Diagnosis not present

## 2014-11-29 DIAGNOSIS — J42 Unspecified chronic bronchitis: Secondary | ICD-10-CM | POA: Diagnosis not present

## 2014-11-29 DIAGNOSIS — Z9181 History of falling: Secondary | ICD-10-CM | POA: Diagnosis not present

## 2014-11-29 DIAGNOSIS — Z96642 Presence of left artificial hip joint: Secondary | ICD-10-CM | POA: Diagnosis not present

## 2014-11-29 DIAGNOSIS — R2689 Other abnormalities of gait and mobility: Secondary | ICD-10-CM | POA: Diagnosis not present

## 2014-11-29 DIAGNOSIS — F17210 Nicotine dependence, cigarettes, uncomplicated: Secondary | ICD-10-CM | POA: Diagnosis not present

## 2014-11-30 DIAGNOSIS — R2689 Other abnormalities of gait and mobility: Secondary | ICD-10-CM | POA: Diagnosis not present

## 2014-11-30 DIAGNOSIS — M549 Dorsalgia, unspecified: Secondary | ICD-10-CM | POA: Diagnosis not present

## 2014-11-30 DIAGNOSIS — J42 Unspecified chronic bronchitis: Secondary | ICD-10-CM | POA: Diagnosis not present

## 2014-11-30 DIAGNOSIS — I10 Essential (primary) hypertension: Secondary | ICD-10-CM | POA: Diagnosis not present

## 2014-11-30 DIAGNOSIS — F17210 Nicotine dependence, cigarettes, uncomplicated: Secondary | ICD-10-CM | POA: Diagnosis not present

## 2014-11-30 DIAGNOSIS — Z96642 Presence of left artificial hip joint: Secondary | ICD-10-CM | POA: Diagnosis not present

## 2014-11-30 DIAGNOSIS — Z9181 History of falling: Secondary | ICD-10-CM | POA: Diagnosis not present

## 2014-12-05 DIAGNOSIS — F17210 Nicotine dependence, cigarettes, uncomplicated: Secondary | ICD-10-CM | POA: Diagnosis not present

## 2014-12-05 DIAGNOSIS — J42 Unspecified chronic bronchitis: Secondary | ICD-10-CM | POA: Diagnosis not present

## 2014-12-05 DIAGNOSIS — Z9181 History of falling: Secondary | ICD-10-CM | POA: Diagnosis not present

## 2014-12-05 DIAGNOSIS — I10 Essential (primary) hypertension: Secondary | ICD-10-CM | POA: Diagnosis not present

## 2014-12-05 DIAGNOSIS — Z96642 Presence of left artificial hip joint: Secondary | ICD-10-CM | POA: Diagnosis not present

## 2014-12-05 DIAGNOSIS — M549 Dorsalgia, unspecified: Secondary | ICD-10-CM | POA: Diagnosis not present

## 2014-12-05 DIAGNOSIS — R2689 Other abnormalities of gait and mobility: Secondary | ICD-10-CM | POA: Diagnosis not present

## 2014-12-07 DIAGNOSIS — M549 Dorsalgia, unspecified: Secondary | ICD-10-CM | POA: Diagnosis not present

## 2014-12-07 DIAGNOSIS — F17210 Nicotine dependence, cigarettes, uncomplicated: Secondary | ICD-10-CM | POA: Diagnosis not present

## 2014-12-07 DIAGNOSIS — I10 Essential (primary) hypertension: Secondary | ICD-10-CM | POA: Diagnosis not present

## 2014-12-07 DIAGNOSIS — J42 Unspecified chronic bronchitis: Secondary | ICD-10-CM | POA: Diagnosis not present

## 2014-12-07 DIAGNOSIS — R2689 Other abnormalities of gait and mobility: Secondary | ICD-10-CM | POA: Diagnosis not present

## 2014-12-07 DIAGNOSIS — Z96642 Presence of left artificial hip joint: Secondary | ICD-10-CM | POA: Diagnosis not present

## 2014-12-07 DIAGNOSIS — Z9181 History of falling: Secondary | ICD-10-CM | POA: Diagnosis not present

## 2014-12-08 DIAGNOSIS — J42 Unspecified chronic bronchitis: Secondary | ICD-10-CM | POA: Diagnosis not present

## 2014-12-08 DIAGNOSIS — I10 Essential (primary) hypertension: Secondary | ICD-10-CM | POA: Diagnosis not present

## 2014-12-08 DIAGNOSIS — F17210 Nicotine dependence, cigarettes, uncomplicated: Secondary | ICD-10-CM | POA: Diagnosis not present

## 2014-12-08 DIAGNOSIS — M549 Dorsalgia, unspecified: Secondary | ICD-10-CM | POA: Diagnosis not present

## 2014-12-08 DIAGNOSIS — R2689 Other abnormalities of gait and mobility: Secondary | ICD-10-CM | POA: Diagnosis not present

## 2014-12-08 DIAGNOSIS — Z9181 History of falling: Secondary | ICD-10-CM | POA: Diagnosis not present

## 2014-12-08 DIAGNOSIS — Z96642 Presence of left artificial hip joint: Secondary | ICD-10-CM | POA: Diagnosis not present

## 2014-12-11 DIAGNOSIS — J42 Unspecified chronic bronchitis: Secondary | ICD-10-CM | POA: Diagnosis not present

## 2014-12-11 DIAGNOSIS — F17210 Nicotine dependence, cigarettes, uncomplicated: Secondary | ICD-10-CM | POA: Diagnosis not present

## 2014-12-11 DIAGNOSIS — M549 Dorsalgia, unspecified: Secondary | ICD-10-CM | POA: Diagnosis not present

## 2014-12-11 DIAGNOSIS — I10 Essential (primary) hypertension: Secondary | ICD-10-CM | POA: Diagnosis not present

## 2014-12-11 DIAGNOSIS — R2689 Other abnormalities of gait and mobility: Secondary | ICD-10-CM | POA: Diagnosis not present

## 2014-12-11 DIAGNOSIS — Z9181 History of falling: Secondary | ICD-10-CM | POA: Diagnosis not present

## 2014-12-11 DIAGNOSIS — Z96642 Presence of left artificial hip joint: Secondary | ICD-10-CM | POA: Diagnosis not present

## 2014-12-12 DIAGNOSIS — Z96642 Presence of left artificial hip joint: Secondary | ICD-10-CM | POA: Diagnosis not present

## 2014-12-12 DIAGNOSIS — R2689 Other abnormalities of gait and mobility: Secondary | ICD-10-CM | POA: Diagnosis not present

## 2014-12-12 DIAGNOSIS — M549 Dorsalgia, unspecified: Secondary | ICD-10-CM | POA: Diagnosis not present

## 2014-12-12 DIAGNOSIS — F17210 Nicotine dependence, cigarettes, uncomplicated: Secondary | ICD-10-CM | POA: Diagnosis not present

## 2014-12-12 DIAGNOSIS — J42 Unspecified chronic bronchitis: Secondary | ICD-10-CM | POA: Diagnosis not present

## 2014-12-12 DIAGNOSIS — I10 Essential (primary) hypertension: Secondary | ICD-10-CM | POA: Diagnosis not present

## 2014-12-12 DIAGNOSIS — Z9181 History of falling: Secondary | ICD-10-CM | POA: Diagnosis not present

## 2014-12-14 DIAGNOSIS — Z96642 Presence of left artificial hip joint: Secondary | ICD-10-CM | POA: Diagnosis not present

## 2014-12-14 DIAGNOSIS — R2689 Other abnormalities of gait and mobility: Secondary | ICD-10-CM | POA: Diagnosis not present

## 2014-12-14 DIAGNOSIS — F17210 Nicotine dependence, cigarettes, uncomplicated: Secondary | ICD-10-CM | POA: Diagnosis not present

## 2014-12-14 DIAGNOSIS — J42 Unspecified chronic bronchitis: Secondary | ICD-10-CM | POA: Diagnosis not present

## 2014-12-14 DIAGNOSIS — Z9181 History of falling: Secondary | ICD-10-CM | POA: Diagnosis not present

## 2014-12-14 DIAGNOSIS — M549 Dorsalgia, unspecified: Secondary | ICD-10-CM | POA: Diagnosis not present

## 2014-12-14 DIAGNOSIS — I10 Essential (primary) hypertension: Secondary | ICD-10-CM | POA: Diagnosis not present

## 2014-12-17 DIAGNOSIS — Z9181 History of falling: Secondary | ICD-10-CM | POA: Diagnosis not present

## 2014-12-17 DIAGNOSIS — M549 Dorsalgia, unspecified: Secondary | ICD-10-CM | POA: Diagnosis not present

## 2014-12-17 DIAGNOSIS — R2689 Other abnormalities of gait and mobility: Secondary | ICD-10-CM | POA: Diagnosis not present

## 2014-12-17 DIAGNOSIS — F17210 Nicotine dependence, cigarettes, uncomplicated: Secondary | ICD-10-CM | POA: Diagnosis not present

## 2014-12-17 DIAGNOSIS — I10 Essential (primary) hypertension: Secondary | ICD-10-CM | POA: Diagnosis not present

## 2014-12-17 DIAGNOSIS — J42 Unspecified chronic bronchitis: Secondary | ICD-10-CM | POA: Diagnosis not present

## 2014-12-17 DIAGNOSIS — Z96642 Presence of left artificial hip joint: Secondary | ICD-10-CM | POA: Diagnosis not present

## 2014-12-19 ENCOUNTER — Encounter: Payer: Self-pay | Admitting: Emergency Medicine

## 2014-12-19 DIAGNOSIS — J42 Unspecified chronic bronchitis: Secondary | ICD-10-CM | POA: Diagnosis not present

## 2014-12-19 DIAGNOSIS — M549 Dorsalgia, unspecified: Secondary | ICD-10-CM | POA: Diagnosis not present

## 2014-12-19 DIAGNOSIS — I10 Essential (primary) hypertension: Secondary | ICD-10-CM | POA: Diagnosis not present

## 2014-12-19 DIAGNOSIS — F17210 Nicotine dependence, cigarettes, uncomplicated: Secondary | ICD-10-CM | POA: Diagnosis not present

## 2014-12-19 DIAGNOSIS — Z9181 History of falling: Secondary | ICD-10-CM | POA: Diagnosis not present

## 2014-12-19 DIAGNOSIS — R2689 Other abnormalities of gait and mobility: Secondary | ICD-10-CM | POA: Diagnosis not present

## 2014-12-19 DIAGNOSIS — Z96642 Presence of left artificial hip joint: Secondary | ICD-10-CM | POA: Diagnosis not present

## 2014-12-20 DIAGNOSIS — M5137 Other intervertebral disc degeneration, lumbosacral region: Secondary | ICD-10-CM | POA: Diagnosis not present

## 2014-12-21 ENCOUNTER — Other Ambulatory Visit: Payer: Self-pay | Admitting: Emergency Medicine

## 2014-12-21 DIAGNOSIS — I10 Essential (primary) hypertension: Secondary | ICD-10-CM

## 2014-12-21 MED ORDER — HYDROCHLOROTHIAZIDE 12.5 MG PO CAPS: ORAL_CAPSULE | ORAL | Status: DC

## 2014-12-22 ENCOUNTER — Encounter: Payer: Self-pay | Admitting: Emergency Medicine

## 2014-12-25 DIAGNOSIS — F17210 Nicotine dependence, cigarettes, uncomplicated: Secondary | ICD-10-CM | POA: Diagnosis not present

## 2014-12-25 DIAGNOSIS — I10 Essential (primary) hypertension: Secondary | ICD-10-CM | POA: Diagnosis not present

## 2014-12-25 DIAGNOSIS — R2689 Other abnormalities of gait and mobility: Secondary | ICD-10-CM | POA: Diagnosis not present

## 2014-12-25 DIAGNOSIS — M549 Dorsalgia, unspecified: Secondary | ICD-10-CM | POA: Diagnosis not present

## 2014-12-25 DIAGNOSIS — Z96642 Presence of left artificial hip joint: Secondary | ICD-10-CM | POA: Diagnosis not present

## 2014-12-25 DIAGNOSIS — J42 Unspecified chronic bronchitis: Secondary | ICD-10-CM | POA: Diagnosis not present

## 2014-12-25 DIAGNOSIS — Z9181 History of falling: Secondary | ICD-10-CM | POA: Diagnosis not present

## 2014-12-29 ENCOUNTER — Encounter: Payer: Self-pay | Admitting: Emergency Medicine

## 2015-01-18 ENCOUNTER — Encounter: Payer: Self-pay | Admitting: Emergency Medicine

## 2015-01-18 ENCOUNTER — Ambulatory Visit (INDEPENDENT_AMBULATORY_CARE_PROVIDER_SITE_OTHER): Payer: Commercial Managed Care - HMO

## 2015-01-18 ENCOUNTER — Ambulatory Visit (INDEPENDENT_AMBULATORY_CARE_PROVIDER_SITE_OTHER): Payer: Commercial Managed Care - HMO | Admitting: Emergency Medicine

## 2015-01-18 VITALS — BP 134/76 | HR 77 | Temp 98.0°F | Resp 16 | Ht 65.0 in | Wt 189.2 lb

## 2015-01-18 DIAGNOSIS — F17200 Nicotine dependence, unspecified, uncomplicated: Secondary | ICD-10-CM

## 2015-01-18 DIAGNOSIS — M25512 Pain in left shoulder: Secondary | ICD-10-CM | POA: Diagnosis not present

## 2015-01-18 DIAGNOSIS — I10 Essential (primary) hypertension: Secondary | ICD-10-CM

## 2015-01-18 DIAGNOSIS — Z23 Encounter for immunization: Secondary | ICD-10-CM | POA: Diagnosis not present

## 2015-01-18 DIAGNOSIS — Z720 Tobacco use: Secondary | ICD-10-CM

## 2015-01-18 DIAGNOSIS — M542 Cervicalgia: Secondary | ICD-10-CM

## 2015-01-18 DIAGNOSIS — E785 Hyperlipidemia, unspecified: Secondary | ICD-10-CM | POA: Diagnosis not present

## 2015-01-18 MED ORDER — GABAPENTIN 100 MG PO CAPS: ORAL_CAPSULE | ORAL | Status: DC

## 2015-01-18 NOTE — Progress Notes (Addendum)
By signing my name below, I, Raven Small, attest that this documentation has been prepared under the direction and in the presence of [name], MD.  Electronically Signed: Thea Alken, ED Scribe. 01/18/2015. 10:42 AM.  Chief Complaint:  Chief Complaint  Patient presents with  . pain in left arm    x 2 mos, "comes down from the neck", throbs  . Headache    "better today"    HPI: [name] is a 64 y.o. female who reports to [facility] today complaining of neck pain and headaches.   Neck pain  Pt c/o sharp left sided neck pain with movement. Pt states neck pain last for a couple seconds and radiates to left arm, described as throbbing pain. She has been on Neurontin in the past for back pain, but states medication did not help with pain. She had CT cervical spine,07/2012, which showed no cspine fx or traumatic subluxation.   Headaches Pt has hx of migraines. Her last migraine was 1-2 month ago and states it lasted for a a couple days. Pt reports she has not had a headache that bad in a couple years.   Immunization Immunization History  Administered Date(s) Administered  . Influenza Split 11/10/2011, 10/25/2014  . Influenza,inj,Quad PF,36+ Mos 12/06/2012, 11/10/2014  . Influenza-Unspecified 10/10/2013  . Zoster 11/25/2012  Tdap- she declines Tdap today due to cost, but plans on returning to clinic for this in a couples month.  Prevnar vacc- pt states she has had pneumonial vaccine several years ago  and agrees receive prevnar today.    Tobacco abuse Pt smokes a pack a day. States she quit for 3 months but restarted after her sister passed away 1 year ago.   Past Medical History  Diagnosis Date  . Chronic bronchitis ([place])     "when I get a cold I normally get bronchitis" (01/30/2013)  . Hepatitis C    Past Surgical History  Procedure Laterality Date  . Back surgery      x5  . Colonoscopy    . Joint replacement Left 01/24/2013    hip  . 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 Posterior lumbar fusion  2006 X 2; 2008  . Lumbar spine surgery  2010; 2011    "tightened up some screws" (01/30/2013)  . Abdominal hysterectomy  1990's  . Tubal ligation  1970's  . Cholecystectomy  1990's   Social History   Social History  . Marital Status: Married    Spouse Name: N/A  . Number of Children: N/A  . Years of Education: N/A   Social History Main Topics  . Smoking status: Current Every Day Smoker -- 1.00 packs/day for 45 years    Types: Cigarettes  . Smokeless tobacco: Never Used  . Alcohol Use: 0.0 oz/week    0 Standard drinks or equivalent per week     Comment: 01/30/2013 "used to drink beer; none since ~ 1989"  . Drug Use: No  . Sexual Activity: Yes    Birth Control/ Protection: Post-menopausal   Other Topics Concern  . Not on file   Social History Narrative   No family history on file. No Known Allergies Prior to Admission medications   Medication Sig Start Date End Date Taking? Authorizing Provider  aspirin 81 MG tablet Take 81 mg by mouth daily.  Historical Provider, MD  budesonide-formoterol (SYMBICORT) 160-4.5 MCG/ACT inhaler Inhale 2 puffs into the lungs 2 (two) times daily. Patient not taking: Reported on 11/21/2014 10/14/13   [name], MD  cyclobenzaprine (FLEXERIL) 10 MG tablet Take 1 tablet (10 mg total) by mouth 2 (two) times daily as needed for muscle spasms. Patient not taking: Reported on 11/21/2014 10/14/13   [name], MD  diclofenac (VOLTAREN) 75 MG EC tablet Take 75 mg by mouth 2 (two) times daily.    Historical Provider, MD  hydrochlorothiazide (MICROZIDE) 12.5 MG capsule Take 2 capsules daily 12/21/14   [name], MD  HYDROcodone-acetaminophen (NORCO/VICODIN) 5-325 MG per tablet Take 1 tablet by mouth every 4 (four) hours as needed for moderate pain or  severe pain. 12/30/13   [name], MD  ibuprofen (ADVIL,MOTRIN) 800 MG tablet Take 1 tablet (800 mg total) by mouth 3 (three) times daily. Take with food Patient not taking: Reported on 11/21/2014 07/17/13   Harvie Heck, PA-C  simvastatin (ZOCOR) 40 MG tablet Take 1 tablet (40 mg total) by mouth daily. 11/21/14   [name], MD     ROS: The patient denies fevers, chills, night sweats, unintentional weight loss, chest pain, palpitations, wheezing, dyspnea on exertion, nausea, vomiting, abdominal pain, dysuria, hematuria, melena, numbness, weakness, or tingling.   All other systems have been reviewed and were otherwise negative with the exception of those mentioned in the HPI and as above.    PHYSICAL EXAM: Filed Vitals:   01/18/15 1041  BP: 134/76  Pulse: 77  Temp: 98 F (36.7 C)  Resp: 16   Body mass index is 31.48 kg/(m^2).   General: Alert, no acute distress HEENT:  Normocephalic, atraumatic, oropharynx patent. Tender left neck.  Eye: Juliette Mangle [facility] Cardiovascular:  Regular rate and rhythm, no rubs murmurs or gallops.  No Carotid bruits, radial pulse intact. No pedal edema.  Respiratory: Clear to auscultation bilaterally.  No wheezes, rales, or rhonchi.  No cyanosis, no use of accessory musculature Abdominal: No organomegaly, abdomen is soft and non-tender, positive bowel sounds.  No masses. Musculoskeletal: Gait intact. No edema, tenderness Skin: No rashes. Neurologic: Facial musculature symmetric. Reflexes and strength in UE are normal Psychiatric: Patient acts appropriately throughout our interaction. Lymphatic: No cervical or submandibular lymphadenopathy   EKG/XRAY:   Primary read interpreted by Dr. Everlene Farrier at [facility]. 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 Mild narrowing of C4-5 Left shoulder - normal  ASSESSMENT/PLAN: We'll try patient on Neurontin for her cervical radiculopathy. Shoulder films appeared normal. No change in medications.that should a  Prevnar . To be given today. Her pneumonia 23 vaccine was given in 2006 I personally performed the services described in this documentation, which was scribed in my presence. The recorded information has been reviewed and is accurate.   Gross sideeffects, risk and benefits, and alternatives of medications d/w patient. Patient is aware that all medications have potential sideeffects and we are unable to predict every sideeffect or drug-drug interaction that may occur.  [name] MD 01/18/2015 10:30 AM

## 2015-01-18 NOTE — Patient Instructions (Signed)

## 2015-01-23 ENCOUNTER — Other Ambulatory Visit: Payer: Self-pay | Admitting: Emergency Medicine

## 2015-01-23 ENCOUNTER — Encounter: Payer: Self-pay | Admitting: Emergency Medicine

## 2015-01-23 DIAGNOSIS — M5010 Cervical disc disorder with radiculopathy, unspecified cervical region: Secondary | ICD-10-CM

## 2015-01-24 ENCOUNTER — Encounter: Payer: Self-pay | Admitting: Emergency Medicine

## 2015-03-28 DIAGNOSIS — M5137 Other intervertebral disc degeneration, lumbosacral region: Secondary | ICD-10-CM | POA: Diagnosis not present

## 2015-03-28 DIAGNOSIS — Z6829 Body mass index (BMI) 29.0-29.9, adult: Secondary | ICD-10-CM | POA: Diagnosis not present

## 2015-03-28 DIAGNOSIS — I10 Essential (primary) hypertension: Secondary | ICD-10-CM | POA: Diagnosis not present

## 2015-04-12 ENCOUNTER — Encounter: Payer: Self-pay | Admitting: Emergency Medicine

## 2015-04-18 ENCOUNTER — Ambulatory Visit: Payer: Self-pay | Admitting: Emergency Medicine

## 2015-04-30 ENCOUNTER — Other Ambulatory Visit: Payer: Self-pay

## 2015-04-30 DIAGNOSIS — Z1231 Encounter for screening mammogram for malignant neoplasm of breast: Secondary | ICD-10-CM

## 2015-05-01 ENCOUNTER — Telehealth: Payer: Self-pay

## 2015-05-01 DIAGNOSIS — Z1211 Encounter for screening for malignant neoplasm of colon: Secondary | ICD-10-CM

## 2015-05-01 NOTE — Telephone Encounter (Signed)
Order pended

## 2015-05-01 NOTE — Telephone Encounter (Signed)
PATIENT WOULD LIKE DR. DAUB TO  KNOW THAT SHE IS READY FOR [place] TO MAKE HER APPOINTMENT TO HAVE A COLONOSCOPY DONE. BEST PHONE [phone] (CELL)  [place]

## 2015-05-03 ENCOUNTER — Encounter: Payer: Self-pay | Admitting: Internal Medicine

## 2015-05-27 ENCOUNTER — Ambulatory Visit
Admission: RE | Admit: 2015-05-27 | Discharge: 2015-05-27 | Disposition: A | Discharge status: Home / Self Care | Payer: Commercial Managed Care - HMO | Source: Ambulatory Visit

## 2015-05-27 DIAGNOSIS — Z1231 Encounter for screening mammogram for malignant neoplasm of breast: Secondary | ICD-10-CM | POA: Diagnosis not present

## 2015-06-10 HISTORY — PX: COLONOSCOPY: SHX174

## 2015-06-18 ENCOUNTER — Ambulatory Visit (AMBULATORY_SURGERY_CENTER): Payer: Self-pay

## 2015-06-18 VITALS — Ht 66.0 in | Wt 184.6 lb

## 2015-06-18 DIAGNOSIS — Z1211 Encounter for screening for malignant neoplasm of colon: Secondary | ICD-10-CM

## 2015-06-18 MED ORDER — NA SULFATE-K SULFATE-MG SULF 17.5-3.13-1.6 GM/177ML PO SOLN
ORAL | Status: DC
Start: 2015-06-18 — End: 2015-07-02

## 2015-06-18 NOTE — Progress Notes (Signed)
Per pt, no allergies to soy or egg products.Pt not taking any weight loss meds or using  O2 at home. 

## 2015-06-19 ENCOUNTER — Encounter: Payer: Self-pay | Admitting: Internal Medicine

## 2015-07-02 ENCOUNTER — Ambulatory Visit (AMBULATORY_SURGERY_CENTER): Payer: Commercial Managed Care - HMO | Admitting: Internal Medicine

## 2015-07-02 ENCOUNTER — Encounter: Payer: Self-pay | Admitting: Internal Medicine

## 2015-07-02 VITALS — BP 134/68 | HR 60 | Temp 98.4°F | Resp 15 | Ht 66.0 in | Wt 184.0 lb

## 2015-07-02 DIAGNOSIS — D120 Benign neoplasm of cecum: Secondary | ICD-10-CM

## 2015-07-02 DIAGNOSIS — Z1211 Encounter for screening for malignant neoplasm of colon: Secondary | ICD-10-CM

## 2015-07-02 DIAGNOSIS — D125 Benign neoplasm of sigmoid colon: Secondary | ICD-10-CM | POA: Diagnosis not present

## 2015-07-02 DIAGNOSIS — K635 Polyp of colon: Secondary | ICD-10-CM | POA: Diagnosis not present

## 2015-07-02 DIAGNOSIS — I10 Essential (primary) hypertension: Secondary | ICD-10-CM | POA: Diagnosis not present

## 2015-07-02 DIAGNOSIS — B1920 Unspecified viral hepatitis C without hepatic coma: Secondary | ICD-10-CM | POA: Diagnosis not present

## 2015-07-02 DIAGNOSIS — E669 Obesity, unspecified: Secondary | ICD-10-CM | POA: Diagnosis not present

## 2015-07-02 MED ORDER — SODIUM CHLORIDE 0.9 % IV SOLN: 500.0000 mL | INTRAVENOUS | Status: DC

## 2015-07-02 NOTE — Op Note (Signed)
[place] Patient Name: [name] Procedure Date: 07/02/2015 10:54 AM MRN: [id number] Endoscopist: Docia Chuck. Henrene Pastor , MD Age: 65 Referring MD:  Date of Birth: 19-May-1950 Gender: Female Procedure:                Colonoscopy, with snare polypectomy X3 Indications:              Screening for colorectal malignant neoplasm.                            Previous examination 2002 without neoplasia Medicines:                Monitored Anesthesia Care Procedure:                Pre-Anesthesia Assessment:                           - Prior to the procedure, a History and Physical                            was performed, and patient medications and                            allergies were reviewed. The patient's tolerance of                            previous anesthesia was also reviewed. The risks                            and benefits of the procedure and the sedation                            options and risks were discussed with the patient.                            All questions were answered, and informed consent                            was obtained. Prior Anticoagulants: The patient has                            taken no previous anticoagulant or antiplatelet                            agents. ASA Grade Assessment: II - A patient with                            mild systemic disease. After reviewing the risks                            and benefits, the patient was deemed in                            satisfactory condition to undergo the procedure.  After obtaining informed consent, the colonoscope                            was passed under direct vision. Throughout the                            procedure, the patient's blood pressure, pulse, and                            oxygen saturations were monitored continuously. The                            Model CF-HQ190L [id number]) scope was introduced                            through the anus  and advanced to the the cecum,                            identified by appendiceal orifice and ileocecal                            valve. The ileocecal valve, appendiceal orifice,                            and rectum were photographed. The quality of the                            bowel preparation was excellent. The colonoscopy                            was performed without difficulty. The patient                            tolerated the procedure well. The bowel preparation                            used was SUPREP. Scope In: 11:02:32 AM Scope Out: 11:20:12 AM Scope Withdrawal Time: 0 hours 13 minutes 45 seconds  Total Procedure Duration: 0 hours 17 minutes 40 seconds  Findings:                 Three polyps were found in the sigmoid colon and                            cecum. The polyps were 2 to 3 mm in size. These                            polyps were removed with a cold snare. Resection                            and retrieval were complete.                           A few small-mouthed diverticula were found in the  sigmoid colon.                           The exam was otherwise without abnormality on                            direct and retroflexion views. Complications:            No immediate complications. Estimated blood loss:                            None. Estimated Blood Loss:     Estimated blood loss: none. Impression:               - Three 2 to 3 mm polyps in the sigmoid colon and                            in the cecum, removed with a cold snare. Resected                            and retrieved.                           - Diverticulosis in the sigmoid colon.                           - The examination was otherwise normal on direct                            and retroflexion views. Recommendation:           - Repeat colonoscopy in 3 - 5 years for                            surveillance, based on pathology results.                            - Patient has a contact number available for                            emergencies. The signs and symptoms of potential                            delayed complications were discussed with the                            patient. Return to normal activities tomorrow.                            Written discharge instructions were provided to the                            patient.                           - Resume previous diet.                           -  Continue present medications.                           - Await pathology results. Docia Chuck. Henrene Pastor, MD 07/02/2015 11:26:30 AM This report has been signed electronically.

## 2015-07-02 NOTE — Progress Notes (Signed)
Report to PACU, RN, vss, BBS= Clear.  

## 2015-07-02 NOTE — Progress Notes (Signed)
Called to room to assist during endoscopic procedure.  Patient ID and intended procedure confirmed with present staff. Received instructions for my participation in the procedure from the performing physician.  

## 2015-07-02 NOTE — Patient Instructions (Signed)
YOU HAD AN ENDOSCOPIC PROCEDURE TODAY AT [facility]:   Refer to the procedure report that was given to you for any specific questions about what was found during the examination.  If the procedure report does not answer your questions, please call your gastroenterologist to clarify.  If you requested that your care partner not be given the details of your procedure findings, then the procedure report has been included in a sealed envelope for you to review at your convenience later.  YOU SHOULD EXPECT: Some feelings of bloating in the abdomen. Passage of more gas than usual.  Walking can help get rid of the air that was put into your GI tract during the procedure and reduce the bloating. If you had a lower endoscopy (such as a colonoscopy or flexible sigmoidoscopy) you may notice spotting of blood in your stool or on the toilet paper. If you underwent a bowel prep for your procedure, you may not have a normal bowel movement for a few days.  Please Note:  You might notice some irritation and congestion in your nose or some drainage.  This is from the oxygen used during your procedure.  There is no need for concern and it should clear up in a day or so.  SYMPTOMS TO REPORT IMMEDIATELY:   Following lower endoscopy (colonoscopy or flexible sigmoidoscopy):  Excessive amounts of blood in the stool  Significant tenderness or worsening of abdominal pains  Swelling of the abdomen that is new, acute  Fever of 100F or higher   For urgent or emergent issues, a gastroenterologist can be reached at any hour by calling [phone].   DIET: Your first meal following the procedure should be a small meal and then it is ok to progress to your normal diet. Heavy or fried foods are harder to digest and may make you feel nauseous or bloated.  Likewise, meals heavy in dairy and vegetables can increase bloating.  Drink plenty of fluids but you should avoid alcoholic beverages for 24  hours.  ACTIVITY:  You should plan to take it easy for the rest of today and you should NOT DRIVE or use heavy machinery until tomorrow (because of the sedation medicines used during the test).    FOLLOW UP: Our staff will call the number listed on your records the next business day following your procedure to check on you and address any questions or concerns that you may have regarding the information given to you following your procedure. If we do not reach you, we will leave a message.  However, if you are feeling well and you are not experiencing any problems, there is no need to return our call.  We will assume that you have returned to your regular daily activities without incident.  If any biopsies were taken you will be contacted by phone or by letter within the next 1-3 weeks.  Please call us at [phone] if you have not heard about the biopsies in 3 weeks.    SIGNATURES/CONFIDENTIALITY: You and/or your care partner have signed paperwork which will be entered into your electronic medical record.  These signatures attest to the fact that that the information above on your After Visit Summary has been reviewed and is understood.  Full responsibility of the confidentiality of this discharge information lies with you and/or your care-partner.  Polyp, diverticulosis, high fiber information  Given.Marland Kitchen

## 2015-07-03 ENCOUNTER — Telehealth: Payer: Self-pay

## 2015-07-03 NOTE — Telephone Encounter (Signed)
  Follow up Call-  Call back number 07/02/2015  Post procedure Call Back phone  # [phone]  Permission to leave phone message Yes     Patient questions:  Do you have a fever, pain , or abdominal swelling? No. Pain Score  0 *  Have you tolerated food without any problems? Yes.    Have you been able to return to your normal activities? Yes.    Do you have any questions about your discharge instructions: Diet   No. Medications  No. Follow up visit  No.  Do you have questions or concerns about your Care? No.  Actions: * If pain score is 4 or above: No action needed, pain <4.

## 2015-07-08 ENCOUNTER — Encounter: Payer: Self-pay | Admitting: Internal Medicine

## 2015-07-16 ENCOUNTER — Other Ambulatory Visit: Payer: Self-pay

## 2015-07-16 DIAGNOSIS — I10 Essential (primary) hypertension: Secondary | ICD-10-CM

## 2015-07-16 MED ORDER — SIMVASTATIN 40 MG PO TABS: 40.0000 mg | ORAL_TABLET | Freq: Every day | ORAL | Status: DC

## 2015-07-16 MED ORDER — HYDROCHLOROTHIAZIDE 12.5 MG PO CAPS: ORAL_CAPSULE | ORAL | Status: DC

## 2015-07-19 ENCOUNTER — Telehealth: Payer: Self-pay

## 2015-07-19 NOTE — Telephone Encounter (Signed)
Patient is requesting a referral for her hip pain. She wants to see Dr. Philipp Ovens at [place]. Her appointment is on Monday June 12 but she understands she might have to cancel depending on how long it takes. Patient states that she is in extreme pain. Patient phone: [phone]

## 2015-07-22 ENCOUNTER — Other Ambulatory Visit: Payer: Self-pay | Admitting: Emergency Medicine

## 2015-07-22 DIAGNOSIS — M25559 Pain in unspecified hip: Secondary | ICD-10-CM

## 2015-07-22 NOTE — Telephone Encounter (Signed)
Dr. Everlene Farrier,  Please see comments below.. Patient would like to know if she can have an ortho referral she would like to see Dr. Philipp Ovens. Please advise   Thanks   Nan Maya

## 2015-07-22 NOTE — Telephone Encounter (Signed)
Call patient. I put in the referral today.

## 2015-07-23 NOTE — Telephone Encounter (Signed)
Pt advised.

## 2015-07-26 DIAGNOSIS — M7071 Other bursitis of hip, right hip: Secondary | ICD-10-CM | POA: Diagnosis not present

## 2015-07-26 DIAGNOSIS — M25551 Pain in right hip: Secondary | ICD-10-CM | POA: Diagnosis not present

## 2015-09-02 DIAGNOSIS — I10 Essential (primary) hypertension: Secondary | ICD-10-CM | POA: Diagnosis not present

## 2015-09-02 DIAGNOSIS — M5137 Other intervertebral disc degeneration, lumbosacral region: Secondary | ICD-10-CM | POA: Diagnosis not present

## 2015-09-10 DIAGNOSIS — M5137 Other intervertebral disc degeneration, lumbosacral region: Secondary | ICD-10-CM | POA: Diagnosis not present

## 2015-11-30 ENCOUNTER — Other Ambulatory Visit: Payer: Self-pay | Admitting: Emergency Medicine

## 2015-12-12 DIAGNOSIS — M5137 Other intervertebral disc degeneration, lumbosacral region: Secondary | ICD-10-CM | POA: Diagnosis not present

## 2015-12-12 DIAGNOSIS — M5126 Other intervertebral disc displacement, lumbar region: Secondary | ICD-10-CM | POA: Diagnosis not present

## 2015-12-19 ENCOUNTER — Other Ambulatory Visit: Payer: Self-pay | Admitting: Emergency Medicine

## 2016-02-06 ENCOUNTER — Other Ambulatory Visit: Payer: Self-pay | Admitting: Urgent Care

## 2016-02-07 ENCOUNTER — Other Ambulatory Visit: Payer: Self-pay | Admitting: Urgent Care

## 2016-03-12 ENCOUNTER — Other Ambulatory Visit: Payer: Self-pay | Admitting: Emergency Medicine

## 2016-03-12 ENCOUNTER — Telehealth: Payer: Self-pay | Admitting: *Deleted

## 2016-03-12 DIAGNOSIS — I10 Essential (primary) hypertension: Secondary | ICD-10-CM

## 2016-03-12 NOTE — Telephone Encounter (Signed)
Spoke with patient (see phone message). She has scheduled a visit for Monday 03/17/2016 and has medication to last until then.

## 2016-03-12 NOTE — Telephone Encounter (Signed)
Left message for patient to return call.  Last seen 01/2015. Needs to establish with new provider, as Dr. Everlene Farrier has retired. OK to provide 30-day supply to allow patient to make arrangements.

## 2016-03-12 NOTE — Telephone Encounter (Signed)
Spoke with patient. She schedule a visit for Monday 03/17/2016. She has enough medication until then.

## 2016-03-12 NOTE — Telephone Encounter (Signed)
Patient called requesting a refill on her blood pressure medication and cholesterol medication pt would like for it to be sent to [facility] on [address], pt is scheduled for an appointment Monday 03/16/16 at 8:30

## 2016-03-16 ENCOUNTER — Ambulatory Visit (INDEPENDENT_AMBULATORY_CARE_PROVIDER_SITE_OTHER): Payer: PPO | Admitting: Family Medicine

## 2016-03-16 VITALS — BP 130/82 | HR 70 | Temp 98.3°F | Resp 16 | Ht 64.75 in | Wt 192.8 lb

## 2016-03-16 DIAGNOSIS — Z8619 Personal history of other infectious and parasitic diseases: Secondary | ICD-10-CM | POA: Diagnosis not present

## 2016-03-16 DIAGNOSIS — E785 Hyperlipidemia, unspecified: Secondary | ICD-10-CM | POA: Diagnosis not present

## 2016-03-16 DIAGNOSIS — M5412 Radiculopathy, cervical region: Secondary | ICD-10-CM

## 2016-03-16 DIAGNOSIS — I10 Essential (primary) hypertension: Secondary | ICD-10-CM | POA: Diagnosis not present

## 2016-03-16 MED ORDER — GABAPENTIN 100 MG PO CAPS: 100.0000 mg | ORAL_CAPSULE | Freq: Three times a day (TID) | ORAL | 1 refills | Status: DC | PRN

## 2016-03-16 MED ORDER — SIMVASTATIN 40 MG PO TABS: 40.0000 mg | ORAL_TABLET | Freq: Every day | ORAL | 1 refills | Status: DC

## 2016-03-16 MED ORDER — HYDROCHLOROTHIAZIDE 25 MG PO TABS: 25.0000 mg | ORAL_TABLET | Freq: Every day | ORAL | 1 refills | Status: DC

## 2016-03-16 NOTE — Patient Instructions (Addendum)
  I will check bloodwork today, but if numbers elevated - may need to repeat that testing after fasting for a full 8 hours.   If arm/neck symptoms flair - can restart the gabapentin.  If that does not help your symptoms - return to discuss other tests.   I changed your blood pressure medication to 25 mg pill, so just one pill per day. Bring your blood pressure meter to the next visit to see if it is obtaining accurate numbers. Let me know if you have questions in the meantime, otherwise schedule a physical within the next 3-6 months.   IF you received an x-ray today, you will receive an invoice from [facility]. Please contact [facility] at [phone] with questions or concerns regarding your invoice.   IF you received labwork today, you will receive an invoice from [place] [facility]. Please contact [facility] at [phone] with questions or concerns regarding your invoice.   Our billing staff will not be able to assist you with questions regarding bills from these companies.  You will be contacted with the lab results as soon as they are available. The fastest way to get your results is to activate your My Chart account. Instructions are located on the last page of this paperwork. If you have not heard from [place] regarding the results in 2 weeks, please contact this office.

## 2016-03-16 NOTE — Progress Notes (Addendum)
By signing my name below, I, Mesha Guinyard, attest that this documentation has been prepared under the direction and in the presence of Merri Ray, MD.  Electronically Signed: Verlee Monte, Medical Scribe. 03/16/16. 9:17 AM.  Subjective:    Patient ID: [name], female    DOB: 1950-12-13, 66 y.o.   MRN: [id number]  HPI Chief Complaint  Patient presents with  . Establish Care  . Medication Refill    Gabapentin,simvastatin and hctz    HPI Comments: [name] is a 66 y.o. female with a PMHx of hip arthritis, tobacco abuse, HLD, and HTN who presents to the [facility] to establish care. Is a former pt of Dr. Everlene Farrier. Prev note reviewed with Dr. Everlene Farrier. The last office visit with Dr. Everlene Farrier was December 2016, discussed neck pain, and migraines at that time. She was treated with gabapentin for a cervical radiculopathy.  Pt drank half a pepsi around 7:30 am (about 2 hours ago).  HTN: Takes HCTZ. Her bp ranges a little above 130/82 when checked at home. Pt notes she exercises in a sitting bike 3x a week. Denies experiencing recent HAs, light-headedness, dizziness, chest pain, or other negative side effects from her medication. Lab Results  Component Value Date   CREATININE 0.64 11/21/2014   BP Readings from Last 3 Encounters:  03/16/16 130/82  07/02/15 134/68  01/18/15 134/76   HLD: Takes simvastatin. Denies experiencing negative side effects from her medication. Lab Results  Component Value Date   ALT 20 11/21/2014   AST 23 11/21/2014   ALKPHOS 112 11/21/2014   BILITOT 0.3 11/21/2014   Lab Results  Component Value Date   CHOL 168 11/21/2014   HDL 40 (L) 11/21/2014   LDLCALC 103 11/21/2014   TRIG 127 11/21/2014   CHOLHDL 4.2 11/21/2014   Neck Pain: Takes gabapentin for her cervical radiculopathy only during her flare ups - reports 2 flares since gabapentin has been prescribed to her. She has radiating arm pain during her flares.  Back Spasms:  Reports more frequent back spasms at times. Resolves fairly quickly.   Hep C Screening: Pt had Hep C in 2006 and went through treatment. She wants to know if there are any remaining traces left.last tested few years ago - ok.   Patient Active Problem List   Diagnosis Date Noted  . Essential hypertension 03/16/2016  . Hyperlipidemia 03/16/2016  . SBO (small bowel obstruction) 01/30/2013  . Tobacco abuse 01/30/2013  . Degenerative arthritis of left hip 01/24/2013  . Status post THR (total hip replacement) 01/24/2013   Past Medical History:  Diagnosis Date  . Chronic bronchitis ([place])    "when I get a cold I normally get bronchitis" (01/30/2013)  . Hepatitis C   . Hyperlipidemia   . Hypertension    Past Surgical History:  Procedure Laterality Date  . ABDOMINAL HYSTERECTOMY  1990's  . BACK SURGERY     x5  . CHOLECYSTECTOMY  1990's  . COLONOSCOPY    . JOINT REPLACEMENT Left 01/24/2013   hip  . LUMBAR SPINE SURGERY  2010; 2011   "tightened up some screws" (01/30/2013)  . POSTERIOR LUMBAR FUSION  2006 X 2; 2008  . TOTAL HIP ARTHROPLASTY Left 01/24/2013   Procedure: LEFT TOTAL HIP ARTHROPLASTY ANTERIOR APPROACH;  Surgeon: Mcarthur Rossetti, MD;  Location: [place];  Service: Orthopedics;  Laterality: Left;  . TUBAL LIGATION  1970's   No Known Allergies Prior to Admission medications   Medication Sig  Start Date End Date Taking? Authorizing Provider  aspirin 81 MG tablet Take 81 mg by mouth daily.   Yes Historical Provider, MD  gabapentin (NEURONTIN) 100 MG capsule take 1-2 capsules by mouth UP TO 3 TIMES A DAY AS NEEDED FOR NECK SHOUDER DISCOMFORT 12/20/15  Yes Jaynee Eagles, PA-C  hydrochlorothiazide (MICROZIDE) 12.5 MG capsule Take 2 capsules daily 07/16/15  Yes [name], MD  HYDROcodone-acetaminophen (NORCO/VICODIN) 5-325 MG per tablet Take 1 tablet by mouth every 4 (four) hours as needed for moderate pain or severe pain. Patient taking differently: Take 1 tablet by mouth.  Norco 10/325mg -Take as needed 12/30/13  Yes [name], MD  ibuprofen (ADVIL,MOTRIN) 800 MG tablet Take 1 tablet (800 mg total) by mouth 3 (three) times daily. Take with food 07/17/13  Yes Harvie Heck, PA-C  simvastatin (ZOCOR) 40 MG tablet take 1 tablet by mouth once daily 12/01/15  Yes Chelle Jeffery, PA-C  budesonide-formoterol (SYMBICORT) 160-4.5 MCG/ACT inhaler Inhale 2 puffs into the lungs 2 (two) times daily. Patient not taking: Reported on 11/21/2014 10/14/13   [name], MD  cyclobenzaprine (FLEXERIL) 10 MG tablet Take 1 tablet (10 mg total) by mouth 2 (two) times daily as needed for muscle spasms. Patient not taking: Reported on 11/21/2014 10/14/13   [name], MD  diclofenac (VOLTAREN) 75 MG EC tablet Take 75 mg by mouth 2 (two) times daily. Reported on 06/18/2015    Historical Provider, MD   Social History   Social History  . Marital status: Married    Spouse name: N/A  . Number of children: N/A  . Years of education: N/A   Occupational History  . Not on file.   Social History Main Topics  . Smoking status: Former Smoker    Packs/day: 1.00    Years: 45.00    Types: Cigarettes    Quit date: 05/25/2015  . Smokeless tobacco: Never Used  . Alcohol use No     Comment: 01/30/2013 "used to drink beer; none since ~ 1989"  . Drug use: No  . Sexual activity: Yes    Birth control/ protection: Post-menopausal   Other Topics Concern  . Not on file   Social History Narrative  . No narrative on file   Review of Systems  Constitutional: Negative for fatigue and unexpected weight change.  Respiratory: Negative for chest tightness and shortness of breath.   Cardiovascular: Negative for chest pain, palpitations and leg swelling.  Gastrointestinal: Negative for abdominal pain and blood in stool.  Musculoskeletal: Positive for arthralgias, back pain and neck pain.  Neurological: Negative for dizziness, syncope, light-headedness and headaches.   Objective:  Physical Exam    Constitutional: She is oriented to person, place, and time. She appears well-developed and well-nourished.  HENT:  Head: Normocephalic and atraumatic.  Eyes: Conjunctivae and EOM are normal. Pupils are equal, round, and reactive to light.  Neck: Carotid bruit is not present.  Cardiovascular: Normal rate, regular rhythm, normal heart sounds and intact distal pulses.  Exam reveals no friction rub.   No murmur heard. Pulmonary/Chest: Effort normal and breath sounds normal. No respiratory distress. She has no wheezes. She has no rales.  Abdominal: Soft. She exhibits no pulsatile midline mass. There is no tenderness.  Musculoskeletal:  Shoulders: Slight decrease in extension, flexion, and rotation but pain free Equal rotation of the shoulders Equal strength upper extermitites  Neurological: She is alert and oriented to person, place, and time.  Skin: Skin is warm and dry.  Psychiatric: She has a normal mood and affect. Her behavior is normal.  Vitals reviewed.  BP 130/82 (BP Location: Right Arm, Patient Position: Sitting, Cuff Size: Large)   Pulse 70   Temp 98.3 F (36.8 C) (Oral)   Resp 16   Ht 5' 4.75" (1.645 m)   Wt 192 lb 12.8 oz (87.5 kg)   SpO2 95%   BMI 32.33 kg/m  Assessment & Plan:   [name] is a 66 y.o. female Essential hypertension - Plan: Comprehensive metabolic panel, hydrochlorothiazide (HYDRODIURIL) 25 MG tablet  - Stable, labs pending, change to 25 mg tablet as that is the effective dose she is taking. No changes otherwise.  Hyperlipidemia, unspecified hyperlipidemia type - Plan: Comprehensive metabolic panel, Lipid panel, simvastatin (ZOCOR) 40 MG tablet  - Tolerating Zocor, continue same dose, labs pending  History of hepatitis C virus infection - Plan: HCV RNA quant  - Check HCV RNA with history of hep C treatment. Asymptomatic.  Cervical radiculopathy - Plan: gabapentin (NEURONTIN) 100 MG capsule  - rare symptoms, overall controlled with episodic  gabapentin dosing, no new side effects of that medication. Refilled.  Return for physical within the next 6 months.   Meds ordered this encounter  Medications  . simvastatin (ZOCOR) 40 MG tablet    Sig: Take 1 tablet (40 mg total) by mouth daily.    Dispense:  90 tablet    Refill:  1  . gabapentin (NEURONTIN) 100 MG capsule    Sig: Take 1-2 capsules (100-200 mg total) by mouth 3 (three) times daily as needed.    Dispense:  90 capsule    Refill:  1  . hydrochlorothiazide (HYDRODIURIL) 25 MG tablet    Sig: Take 1 tablet (25 mg total) by mouth daily.    Dispense:  90 tablet    Refill:  1   Patient Instructions    I will check bloodwork today, but if numbers elevated - may need to repeat that testing after fasting for a full 8 hours.   If arm/neck symptoms flair - can restart the gabapentin.  If that does not help your symptoms - return to discuss other tests.   I changed your blood pressure medication to 25 mg pill, so just one pill per day. Bring your blood pressure meter to the next visit to see if it is obtaining accurate numbers. Let me know if you have questions in the meantime, otherwise schedule a physical within the next 3-6 months.   IF you received an x-ray today, you will receive an invoice from [facility]. Please contact [facility] at [phone] with questions or concerns regarding your invoice.   IF you received labwork today, you will receive an invoice from [facility]. Please contact [facility] at [phone] with questions or concerns regarding your invoice.   Our billing staff will not be able to assist you with questions regarding bills from these companies.  You will be contacted with the lab results as soon as they are available. The fastest way to get your results is to activate your My Chart account. Instructions are located on the last page of this paperwork. If you have not heard from [place] regarding the results in 2 weeks, please contact this  office.      I personally performed the services described in this documentation, which was scribed in my presence. The recorded information has been reviewed and considered for accuracy and completeness, addended by me as needed, and agree with information above.  Signed,   Merri Ray, MD [facility].  03/16/16 9:36 AM

## 2016-03-17 LAB — COMPREHENSIVE METABOLIC PANEL
A/G RATIO: 1.4 (ref 1.2–2.2)
ALBUMIN: 4.7 g/dL (ref 3.6–4.8)
ALK PHOS: 82 IU/L (ref 39–117)
ALT: 25 IU/L (ref 0–32)
AST: 30 IU/L (ref 0–40)
BILIRUBIN TOTAL: 0.4 mg/dL (ref 0.0–1.2)
BUN / CREAT RATIO: 19 (ref 12–28)
BUN: 14 mg/dL (ref 8–27)
CHLORIDE: 102 mmol/L (ref 96–106)
CO2: 27 mmol/L (ref 18–29)
Calcium: 10 mg/dL (ref 8.7–10.3)
Creatinine, Ser: 0.74 mg/dL (ref 0.57–1.00)
GFR calc non Af Amer: 86 mL/min/{1.73_m2} (ref >59)
GFR, EST AFRICAN AMERICAN: 99 mL/min/{1.73_m2} (ref >59)
GLUCOSE: 105 mg/dL — AB (ref 65–99)
Globulin, Total: 3.3 g/dL (ref 1.5–4.5)
POTASSIUM: 4.1 mmol/L (ref 3.5–5.2)
SODIUM: 142 mmol/L (ref 134–144)
Total Protein: 8 g/dL (ref 6.0–8.5)

## 2016-03-17 LAB — LIPID PANEL
CHOLESTEROL TOTAL: 127 mg/dL (ref 100–199)
Chol/HDL Ratio: 2.3 ratio units (ref 0.0–4.4)
HDL: 56 mg/dL (ref >39)
LDL Calculated: 58 mg/dL (ref 0–99)
Triglycerides: 67 mg/dL (ref 0–149)
VLDL Cholesterol Cal: 13 mg/dL (ref 5–40)

## 2016-03-17 LAB — HCV RNA QUANT: HEPATITIS C QUANTITATION: NOT DETECTED [IU]/mL

## 2016-04-08 DIAGNOSIS — M961 Postlaminectomy syndrome, not elsewhere classified: Secondary | ICD-10-CM | POA: Diagnosis not present

## 2016-06-02 ENCOUNTER — Other Ambulatory Visit: Payer: Self-pay | Admitting: Emergency Medicine

## 2016-06-02 DIAGNOSIS — Z1231 Encounter for screening mammogram for malignant neoplasm of breast: Secondary | ICD-10-CM

## 2016-06-22 DIAGNOSIS — M961 Postlaminectomy syndrome, not elsewhere classified: Secondary | ICD-10-CM | POA: Diagnosis not present

## 2016-06-22 DIAGNOSIS — I10 Essential (primary) hypertension: Secondary | ICD-10-CM | POA: Diagnosis not present

## 2016-06-22 DIAGNOSIS — Z6831 Body mass index (BMI) 31.0-31.9, adult: Secondary | ICD-10-CM | POA: Diagnosis not present

## 2016-06-22 DIAGNOSIS — M5126 Other intervertebral disc displacement, lumbar region: Secondary | ICD-10-CM | POA: Diagnosis not present

## 2016-06-29 ENCOUNTER — Telehealth: Payer: Self-pay | Admitting: General Practice

## 2016-06-29 DIAGNOSIS — I10 Essential (primary) hypertension: Secondary | ICD-10-CM

## 2016-06-29 MED ORDER — HYDROCHLOROTHIAZIDE 25 MG PO TABS: 25.0000 mg | ORAL_TABLET | Freq: Every day | ORAL | 0 refills | Status: DC

## 2016-06-29 NOTE — Telephone Encounter (Signed)
Pt is needing a refill on her blood pressure medication   Best number [phone]

## 2016-06-30 ENCOUNTER — Ambulatory Visit
Admission: RE | Admit: 2016-06-30 | Discharge: 2016-06-30 | Disposition: A | Discharge status: Home / Self Care | Payer: PPO | Source: Ambulatory Visit | Attending: Emergency Medicine | Admitting: Emergency Medicine

## 2016-06-30 DIAGNOSIS — Z1231 Encounter for screening mammogram for malignant neoplasm of breast: Secondary | ICD-10-CM | POA: Diagnosis not present

## 2016-07-02 ENCOUNTER — Ambulatory Visit (INDEPENDENT_AMBULATORY_CARE_PROVIDER_SITE_OTHER): Payer: PPO | Admitting: Orthopaedic Surgery

## 2016-07-02 ENCOUNTER — Ambulatory Visit (INDEPENDENT_AMBULATORY_CARE_PROVIDER_SITE_OTHER): Payer: PPO

## 2016-07-02 DIAGNOSIS — M7062 Trochanteric bursitis, left hip: Secondary | ICD-10-CM

## 2016-07-02 DIAGNOSIS — M25552 Pain in left hip: Secondary | ICD-10-CM

## 2016-07-02 MED ORDER — DICLOFENAC SODIUM 1.5 % TD SOLN: 2.0000 [drp] | Freq: Three times a day (TID) | TRANSDERMAL | 2 refills | Status: DC

## 2016-07-02 MED ORDER — METHYLPREDNISOLONE 4 MG PO TABS: ORAL_TABLET | ORAL | 0 refills | Status: DC

## 2016-07-02 NOTE — Progress Notes (Signed)
Office Visit Note   Patient: [name]           Date of Birth: November 09, 1950           MRN: [id number] Visit Date: 07/02/2016              Requested by: [name], MD [address] [place], [place] [address] PCP: [name], MD   Assessment & Plan: Visit Diagnoses:  1. Pain in left hip   2. Trochanteric bursitis, left hip     Plan: I feel that what she herself is feeling is trochanteric bursitis of her hip. Offered a steroid injection in this area which she denies this. I then showed her stretching exercises and eager try that will help. We'll send in for a topical anti-inflammatory a steroid taper. I want physical therapy but she'll try the stretching she says on her own. I told her this bothers her enough to come back and see [place] we could always inject this area. All questions were encouraged and answered.  Follow-Up Instructions: Return if symptoms worsen or fail to improve.   Orders:  Orders Placed This Encounter  Procedures  . XR HIP UNILAT W OR W/O PELVIS 1V LEFT   Meds ordered this encounter  Medications  . methylPREDNISolone (MEDROL) 4 MG tablet    Sig: Medrol dose pack. Take as instructed    Dispense:  21 tablet    Refill:  0  . Diclofenac Sodium (PENNSAID) 1.5 % SOLN    Sig: Place 0.1 mLs onto the skin 3 (three) times daily.    Dispense:  1 Bottle    Refill:  2      Procedures: No procedures performed   Clinical Data: No additional findings.   Subjective: No chief complaint on file. Patient is a 66 year old female that I performed a left total hip arthroplasty on some time ago. She's been now having some pain around her hip. She has chronic back issues but her neurosurgeon said that her right lower extremity and thigh pain is not related to her back. She says it is on the side of her hip that hurts some in her thigh but also a little bit in the groin. Is been slowly getting worse with time. She is on a lot of medications but nothing seems to  work. She's been trying stretching exercises she does ambulate with a rolling walker.  HPI  Review of Systems She denies any chest pain, head ache, shortness of breath, fever, chills, nausea, vomiting.  Objective: Vital Signs: There were no vitals taken for this visit.  Physical Exam She is alert or 3 and in no acute distress Ortho Exam Examination of her left hip does show fluid range of motion actively and passively left hip. She is point tender to palpation of the trochanteric area. There is no significant pain around her groin when I examine her but she seems to be uncomfortable. Her leg lengths are near equal. Specialty Comments:  No specialty comments available.  Imaging: Xr Hip Unilat W Or W/o Pelvis 1v Left  Result Date: 07/02/2016 An AP pelvis lateral left hip show well-seated implant with no, getting features. There is good bone ingrowth acetabular component and the femoral component. There is no evidence of loosening. There is no effusion. There is no cortical irregularities to suggest ostial lysis. I see no, getting features with the hip replacement.    PMFS History: Patient Active Problem List   Diagnosis Date Noted  .  Trochanteric bursitis, left hip 07/02/2016  . Essential hypertension 03/16/2016  . Hyperlipidemia 03/16/2016  . SBO (small bowel obstruction) ([place]) 01/30/2013  . Tobacco abuse 01/30/2013  . Degenerative arthritis of left hip 01/24/2013  . Status post THR (total hip replacement) 01/24/2013   Past Medical History:  Diagnosis Date  . Chronic bronchitis ([place])    "when I get a cold I normally get bronchitis" (01/30/2013)  . Hepatitis C   . Hyperlipidemia   . Hypertension     Family History  Problem Relation Age of Onset  . Throat cancer Mother   . Breast cancer Sister   . Prostate cancer Brother     Past Surgical History:  Procedure Laterality Date  . ABDOMINAL HYSTERECTOMY  1990's  . BACK SURGERY     x5  . CHOLECYSTECTOMY  1990's  .  COLONOSCOPY    . JOINT REPLACEMENT Left 01/24/2013   hip  . LUMBAR SPINE SURGERY  2010; 2011   "tightened up some screws" (01/30/2013)  . POSTERIOR LUMBAR FUSION  2006 X 2; 2008  . TOTAL HIP ARTHROPLASTY Left 01/24/2013   Procedure: LEFT TOTAL HIP ARTHROPLASTY ANTERIOR APPROACH;  Surgeon: Mcarthur Rossetti, MD;  Location: [place];  Service: Orthopedics;  Laterality: Left;  . TUBAL LIGATION  1970's   Social History   Occupational History  . Not on file.   Social History Main Topics  . Smoking status: Former Smoker    Packs/day: 1.00    Years: 45.00    Types: Cigarettes    Quit date: 05/25/2015  . Smokeless tobacco: Never Used  . Alcohol use No     Comment: 01/30/2013 "used to drink beer; none since ~ 1989"  . Drug use: No  . Sexual activity: Yes    Birth control/ protection: Post-menopausal

## 2016-08-21 ENCOUNTER — Telehealth: Payer: Self-pay | Admitting: Family Medicine

## 2016-08-21 NOTE — Telephone Encounter (Signed)
Pt is needing to talk with someone about getting a wheel chair   Best number [phone]

## 2016-08-25 NOTE — Telephone Encounter (Signed)
Please advise 

## 2016-08-26 NOTE — Telephone Encounter (Signed)
More info please. I saw her once in February for HTN, cervical radiculopathy, and back spasms. It appears she has been seen by ortho for hip pain. What is reason for wheelchair request?

## 2016-08-27 MED ORDER — WHEELCHAIR MISC: 1.0000 | 0 refills | Status: AC | PRN

## 2016-08-27 NOTE — Telephone Encounter (Signed)
Pt states she is unable to walk long distances. Currently using rollator but unable to get around effective without taking multiple breaks due to bursitis and back pain. Recommended by Dr. Saintclair Halsted at ortho to have PCP order w/c.

## 2016-08-27 NOTE — Telephone Encounter (Signed)
Prescription printed. Let me know if this needs to be ordered differently. This can be faxed to [facility] supply or location of patient's choice.

## 2016-08-28 NOTE — Telephone Encounter (Signed)
Faxed to [facility] medical supply

## 2016-08-31 ENCOUNTER — Telehealth: Payer: Self-pay

## 2016-08-31 NOTE — Telephone Encounter (Signed)
Advised pt that we have [facility]. [facility] does not file insurance claims.

## 2016-09-14 DIAGNOSIS — M5126 Other intervertebral disc displacement, lumbar region: Secondary | ICD-10-CM | POA: Diagnosis not present

## 2016-09-14 DIAGNOSIS — M961 Postlaminectomy syndrome, not elsewhere classified: Secondary | ICD-10-CM | POA: Diagnosis not present

## 2016-09-14 DIAGNOSIS — M62838 Other muscle spasm: Secondary | ICD-10-CM | POA: Diagnosis not present

## 2016-09-14 DIAGNOSIS — M5137 Other intervertebral disc degeneration, lumbosacral region: Secondary | ICD-10-CM | POA: Diagnosis not present

## 2016-09-23 ENCOUNTER — Other Ambulatory Visit: Payer: Self-pay | Admitting: Family Medicine

## 2016-09-23 DIAGNOSIS — I10 Essential (primary) hypertension: Secondary | ICD-10-CM

## 2016-09-30 NOTE — Telephone Encounter (Signed)
Please call and schedule patient and appt within 1 month with Dr Carlota Raspberry.

## 2016-10-01 DIAGNOSIS — M5137 Other intervertebral disc degeneration, lumbosacral region: Secondary | ICD-10-CM | POA: Diagnosis not present

## 2016-10-01 DIAGNOSIS — M545 Low back pain: Secondary | ICD-10-CM | POA: Diagnosis not present

## 2016-10-01 DIAGNOSIS — I10 Essential (primary) hypertension: Secondary | ICD-10-CM | POA: Diagnosis not present

## 2016-10-01 DIAGNOSIS — M7062 Trochanteric bursitis, left hip: Secondary | ICD-10-CM | POA: Diagnosis not present

## 2016-10-01 NOTE — Telephone Encounter (Signed)
mychart message sent to pt about making an apt for more refills °

## 2016-10-15 ENCOUNTER — Other Ambulatory Visit: Payer: Self-pay | Admitting: Family Medicine

## 2016-10-15 ENCOUNTER — Ambulatory Visit (INDEPENDENT_AMBULATORY_CARE_PROVIDER_SITE_OTHER): Payer: PPO

## 2016-10-15 VITALS — BP 130/75 | HR 68 | Temp 97.5°F | Ht 65.0 in | Wt 189.0 lb

## 2016-10-15 DIAGNOSIS — Z0000 Encounter for general adult medical examination without abnormal findings: Secondary | ICD-10-CM | POA: Diagnosis not present

## 2016-10-15 DIAGNOSIS — E2839 Other primary ovarian failure: Secondary | ICD-10-CM

## 2016-10-15 DIAGNOSIS — Z1159 Encounter for screening for other viral diseases: Secondary | ICD-10-CM | POA: Diagnosis not present

## 2016-10-15 DIAGNOSIS — Z23 Encounter for immunization: Secondary | ICD-10-CM

## 2016-10-15 DIAGNOSIS — Z780 Asymptomatic menopausal state: Secondary | ICD-10-CM | POA: Diagnosis not present

## 2016-10-15 NOTE — Progress Notes (Signed)
Subjective:   [name] is a 66 y.o. female who presents for an Initial Medicare Annual Wellness Visit.  Review of Systems    N/A  Cardiac Risk Factors include: advanced age (>50men, >35 women);hypertension;dyslipidemia     Objective:    Today's Vitals   10/15/16 1023  BP: 130/75  Pulse: 68  Temp: (!) 97.5 F (36.4 C)  TempSrc: Oral  Weight: 189 lb (85.7 kg)  Height: 5\' 5"  (1.651 m)   Body mass index is 31.45 kg/m.   Current Medications (verified) Outpatient Encounter Prescriptions as of 10/15/2016  Medication Sig  . aspirin 81 MG tablet Take 81 mg by mouth daily.  Marland Kitchen gabapentin (NEURONTIN) 100 MG capsule Take 1-2 capsules (100-200 mg total) by mouth 3 (three) times daily as needed.  . hydrochlorothiazide (HYDRODIURIL) 25 MG tablet Take 1 tablet (25 mg total) by mouth daily.  Marland Kitchen HYDROcodone-acetaminophen (NORCO) 10-325 MG tablet Take 1 tablet by mouth every 8 (eight) hours as needed.  . Misc. Devices [facility]) MISC 1 Device by Does not apply route as needed. Needed for low back pain, history of lumbar surgery, rollator insufficient for walking due to bursitis and back pain.  . simvastatin (ZOCOR) 40 MG tablet Take 1 tablet (40 mg total) by mouth daily.  Marland Kitchen tiZANidine (ZANAFLEX) 4 MG tablet Take 4 mg by mouth every 8 (eight) hours as needed for muscle spasms.  . [DISCONTINUED] diclofenac (VOLTAREN) 75 MG EC tablet Take 75 mg by mouth 2 (two) times daily. Reported on 06/18/2015  . [DISCONTINUED] Diclofenac Sodium (PENNSAID) 1.5 % SOLN Place 0.1 mLs onto the skin 3 (three) times daily.  . [DISCONTINUED] hydrochlorothiazide (HYDRODIURIL) 25 MG tablet take 1 tablet by mouth once daily  . [DISCONTINUED] HYDROcodone-acetaminophen (NORCO/VICODIN) 5-325 MG per tablet Take 1 tablet by mouth every 4 (four) hours as needed for moderate pain or severe pain. (Patient taking differently: Take 1 tablet by mouth. Norco 10/325mg -Take as needed)  . [DISCONTINUED] ibuprofen (ADVIL,MOTRIN)  800 MG tablet Take 1 tablet (800 mg total) by mouth 3 (three) times daily. Take with food  . [DISCONTINUED] methylPREDNISolone (MEDROL) 4 MG tablet Medrol dose pack. Take as instructed   No facility-administered encounter medications on file as of 10/15/2016.     Allergies (verified) Patient has no known allergies.   History: Past Medical History:  Diagnosis Date  . Chronic bronchitis ([place])    "when I get a cold I normally get bronchitis" (01/30/2013)  . Hepatitis C   . Hyperlipidemia   . Hypertension    Past Surgical History:  Procedure Laterality Date  . ABDOMINAL HYSTERECTOMY  1990's  . BACK SURGERY     x5  . CHOLECYSTECTOMY  1990's  . COLONOSCOPY    . JOINT REPLACEMENT Left 01/24/2013   hip  . LUMBAR SPINE SURGERY  2010; 2011   "tightened up some screws" (01/30/2013)  . POSTERIOR LUMBAR FUSION  2006 X 2; 2008  . TOTAL HIP ARTHROPLASTY Left 01/24/2013   Procedure: LEFT TOTAL HIP ARTHROPLASTY ANTERIOR APPROACH;  Surgeon: Mcarthur Rossetti, MD;  Location: [place];  Service: Orthopedics;  Laterality: Left;  . TUBAL LIGATION  1970's   Family History  Problem Relation Age of Onset  . Throat cancer Mother   . Breast cancer Sister   . Prostate cancer Brother    Social History   Occupational History  . Not on file.   Social History Main Topics  . Smoking status: Former Smoker    Packs/day: 1.00  Years: 45.00    Types: Cigarettes    Quit date: 05/25/2015  . Smokeless tobacco: Never Used  . Alcohol use No     Comment: 01/30/2013 "used to drink beer; none since ~ 1989"  . Drug use: No  . Sexual activity: Yes    Birth control/ protection: Post-menopausal    Tobacco Counseling Counseling given: Not Answered   Activities of Daily Living In your present state of health, do you have any difficulty performing the following activities: 10/15/2016  Hearing? N  Vision? N  Difficulty concentrating or making decisions? N  Walking or climbing stairs? N  Dressing or  bathing? N  Doing errands, shopping? N  Preparing Food and eating ? N  Using the Toilet? N  In the past six months, have you accidently leaked urine? N  Do you have problems with loss of bowel control? N  Managing your Medications? N  Managing your Finances? N  Housekeeping or managing your Housekeeping? N  Some recent data might be hidden    Immunizations and Health Maintenance Immunization History  Administered Date(s) Administered  . Influenza Split 11/10/2011, 10/25/2014  . Influenza,inj,Quad PF,6+ Mos 12/06/2012, 11/10/2014, 10/15/2016  . Influenza-Unspecified 10/10/2013, 11/05/2015  . Pneumococcal Conjugate-13 01/18/2015  . Pneumococcal-Unspecified 12/15/2004  . Tdap 09/02/2006, 11/05/2015  . Zoster 11/25/2012  . Zoster Recombinat (Shingrix) 08/06/2016   Health Maintenance Due  Topic Date Due  . HIV Screening  06/07/1966  . PAP SMEAR  06/06/1972  . DEXA SCAN  06/06/2016  . PNA vac Low Risk Adult (2 of 2 - PPSV23) 06/06/2016    Patient Care Team: Wendie Agreste, MD as PCP - General (Family Medicine) Webb Laws, [place] as Referring Physician (Optometry) Chyrl Civatte Thane Edu, PA-C as Physician Assistant (Neurosurgery)  Indicate any recent Medical Services you may have received from other than [facility] providers in the past year (date may be approximate).     Assessment:   This is a routine wellness examination for [name].   Hearing/Vision screen Vision Screening Comments: Patient sees Dr. Einar Gip for her regular eye exams. Patient will be calling this month to schedule appointment.   Dietary issues and exercise activities discussed: Current Exercise Habits: Structured exercise class, Time (Minutes): 50, Frequency (Times/Week): 4, Weekly Exercise (Minutes/Week): 200, Intensity: Moderate, Exercise limited by: None identified  Goals    . Increase water intake          Patient will try to increase her water intake daily and decrease her soda intake.        Depression Screen PHQ 2/9 Scores 10/15/2016 03/16/2016 11/21/2014  PHQ - 2 Score 0 0 0    Fall Risk Fall Risk  10/15/2016 03/16/2016  Falls in the past year? No No    Cognitive Function:     6CIT Screen 10/15/2016  What Year? 0 points  What month? 0 points  What time? 0 points  Count back from 20 0 points  Months in reverse 0 points  Repeat phrase 0 points  Total Score 0    Screening Tests Health Maintenance  Topic Date Due  . HIV Screening  06/07/1966  . PAP SMEAR  06/06/1972  . DEXA SCAN  06/06/2016  . PNA vac Low Risk Adult (2 of 2 - PPSV23) 06/06/2016  . MAMMOGRAM  07/01/2018  . COLONOSCOPY  07/01/2020  . TETANUS/TDAP  11/04/2025  . INFLUENZA VACCINE  Completed  . Hepatitis C Screening  Completed      Plan:   I have personally  reviewed and noted the following in the patient's chart:   . Medical and social history . Use of alcohol, tobacco or illicit drugs  . Current medications and supplements . Functional ability and status . Nutritional status . Physical activity . Advanced directives . List of other physicians . Hospitalizations, surgeries, and ER visits in previous 12 months . Vitals . Screenings to include cognitive, depression, and falls . Referrals and appointments  In addition, I have reviewed and discussed with patient certain preventive protocols, quality metrics, and best practice recommendations. A written personalized care plan for preventive services as well as general preventive health recommendations were provided to patient.  HIV screening ordered. Bone density ordered.   Andrez Grime, LPN   09/16/8674

## 2016-10-15 NOTE — Patient Instructions (Addendum)
[name] , Thank you for taking time to come for your Medicare Wellness Visit. I appreciate your ongoing commitment to your health goals. Please review the following plan we discussed and let me know if I can assist you in the future.   Screening recommendations/referrals: Colonoscopy: up to date, next due 07/01/2025 Mammogram: up to date, next due 07/02/2018 Bone Density: due, scheduled today Recommended yearly ophthalmology/optometry visit for glaucoma screening and checkup Recommended yearly dental visit for hygiene and checkup  Vaccinations: Influenza vaccine: administered today Pneumococcal vaccine: up to date Tdap vaccine: up to date, next due 11/04/2025 Shingles vaccine: up to date    Advanced directives: Advance directive discussed with you today. Even though you declined this today please call our office should you change your mind and we can give you the proper paperwork for you to fill out.   Conditions/risks identified: Patient will try to increase her water intake daily and decrease her soda intake  Next appointment: 10/20/16 @ 10:20 am with Dr. Carlota Raspberry   Preventive Care 65 Years and Older, Female Preventive care refers to lifestyle choices and visits with your health care provider that can promote health and wellness. What does preventive care include?  A yearly physical exam. This is also called an annual well check.  Dental exams once or twice a year.  Routine eye exams. Ask your health care provider how often you should have your eyes checked.  Personal lifestyle choices, including:  Daily care of your teeth and gums.  Regular physical activity.  Eating a healthy diet.  Avoiding tobacco and drug use.  Limiting alcohol use.  Practicing safe sex.  Taking low-dose aspirin every day.  Taking vitamin and mineral supplements as recommended by your health care provider. What happens during an annual well check? The services and screenings done by your health  care provider during your annual well check will depend on your age, overall health, lifestyle risk factors, and family history of disease. Counseling  Your health care provider may ask you questions about your:  Alcohol use.  Tobacco use.  Drug use.  Emotional well-being.  Home and relationship well-being.  Sexual activity.  Eating habits.  History of falls.  Memory and ability to understand (cognition).  Work and work Statistician.  Reproductive health. Screening  You may have the following tests or measurements:  Height, weight, and BMI.  Blood pressure.  Lipid and cholesterol levels. These may be checked every 5 years, or more frequently if you are over 62 years old.  Skin check.  Lung cancer screening. You may have this screening every year starting at age 53 if you have a 30-pack-year history of smoking and currently smoke or have quit within the past 15 years.  Fecal occult blood test (FOBT) of the stool. You may have this test every year starting at age 16.  Flexible sigmoidoscopy or colonoscopy. You may have a sigmoidoscopy every 5 years or a colonoscopy every 10 years starting at age 46.  Hepatitis C blood test.  Hepatitis B blood test.  Sexually transmitted disease (STD) testing.  Diabetes screening. This is done by checking your blood sugar (glucose) after you have not eaten for a while (fasting). You may have this done every 1-3 years.  Bone density scan. This is done to screen for osteoporosis. You may have this done starting at age 11.  Mammogram. This may be done every 1-2 years. Talk to your health care provider about how often you should have regular mammograms.  Talk with your health care provider about your test results, treatment options, and if necessary, the need for more tests. Vaccines  Your health care provider may recommend certain vaccines, such as:  Influenza vaccine. This is recommended every year.  Tetanus, diphtheria, and  acellular pertussis (Tdap, Td) vaccine. You may need a Td booster every 10 years.  Zoster vaccine. You may need this after age 73.  Pneumococcal 13-valent conjugate (PCV13) vaccine. One dose is recommended after age 100.  Pneumococcal polysaccharide (PPSV23) vaccine. One dose is recommended after age 66. Talk to your health care provider about which screenings and vaccines you need and how often you need them. This information is not intended to replace advice given to you by your health care provider. Make sure you discuss any questions you have with your health care provider. Document Released: 02/22/2015 Document Revised: 10/16/2015 Document Reviewed: 11/27/2014 Elsevier Interactive Patient Education  2017 [place] Prevention in the Home Falls can cause injuries. They can happen to people of all ages. There are many things you can do to make your home safe and to help prevent falls. What can I do on the outside of my home?  Regularly fix the edges of walkways and driveways and fix any cracks.  Remove anything that might make you trip as you walk through a door, such as a raised step or threshold.  Trim any bushes or trees on the path to your home.  Use bright outdoor lighting.  Clear any walking paths of anything that might make someone trip, such as rocks or tools.  Regularly check to see if handrails are loose or broken. Make sure that both sides of any steps have handrails.  Any raised decks and porches should have guardrails on the edges.  Have any leaves, snow, or ice cleared regularly.  Use sand or salt on walking paths during winter.  Clean up any spills in your garage right away. This includes oil or grease spills. What can I do in the bathroom?  Use night lights.  Install grab bars by the toilet and in the tub and shower. Do not use towel bars as grab bars.  Use non-skid mats or decals in the tub or shower.  If you need to sit down in the shower, use  a plastic, non-slip stool.  Keep the floor dry. Clean up any water that spills on the floor as soon as it happens.  Remove soap buildup in the tub or shower regularly.  Attach bath mats securely with double-sided non-slip rug tape.  Do not have throw rugs and other things on the floor that can make you trip. What can I do in the bedroom?  Use night lights.  Make sure that you have a light by your bed that is easy to reach.  Do not use any sheets or blankets that are too big for your bed. They should not hang down onto the floor.  Have a firm chair that has side arms. You can use this for support while you get dressed.  Do not have throw rugs and other things on the floor that can make you trip. What can I do in the kitchen?  Clean up any spills right away.  Avoid walking on wet floors.  Keep items that you use a lot in easy-to-reach places.  If you need to reach something above you, use a strong step stool that has a grab bar.  Keep electrical cords out of the way.  Do not use floor polish or wax that makes floors slippery. If you must use wax, use non-skid floor wax.  Do not have throw rugs and other things on the floor that can make you trip. What can I do with my stairs?  Do not leave any items on the stairs.  Make sure that there are handrails on both sides of the stairs and use them. Fix handrails that are broken or loose. Make sure that handrails are as long as the stairways.  Check any carpeting to make sure that it is firmly attached to the stairs. Fix any carpet that is loose or worn.  Avoid having throw rugs at the top or bottom of the stairs. If you do have throw rugs, attach them to the floor with carpet tape.  Make sure that you have a light switch at the top of the stairs and the bottom of the stairs. If you do not have them, ask someone to add them for you. What else can I do to help prevent falls?  Wear shoes that:  Do not have high heels.  Have  rubber bottoms.  Are comfortable and fit you well.  Are closed at the toe. Do not wear sandals.  If you use a stepladder:  Make sure that it is fully opened. Do not climb a closed stepladder.  Make sure that both sides of the stepladder are locked into place.  Ask someone to hold it for you, if possible.  Clearly mark and make sure that you can see:  Any grab bars or handrails.  First and last steps.  Where the edge of each step is.  Use tools that help you move around (mobility aids) if they are needed. These include:  Canes.  Walkers.  Scooters.  Crutches.  Turn on the lights when you go into a dark area. Replace any light bulbs as soon as they burn out.  Set up your furniture so you have a clear path. Avoid moving your furniture around.  If any of your floors are uneven, fix them.  If there are any pets around you, be aware of where they are.  Review your medicines with your doctor. Some medicines can make you feel dizzy. This can increase your chance of falling. Ask your doctor what other things that you can do to help prevent falls. This information is not intended to replace advice given to you by your health care provider. Make sure you discuss any questions you have with your health care provider. Document Released: 11/22/2008 Document Revised: 07/04/2015 Document Reviewed: 03/02/2014 Elsevier Interactive Patient Education  2017 [employer].

## 2016-10-16 LAB — HIV ANTIBODY (ROUTINE TESTING W REFLEX): HIV SCREEN 4TH GENERATION: NONREACTIVE

## 2016-10-20 ENCOUNTER — Ambulatory Visit (INDEPENDENT_AMBULATORY_CARE_PROVIDER_SITE_OTHER): Payer: PPO | Admitting: Family Medicine

## 2016-10-20 ENCOUNTER — Encounter: Payer: Self-pay | Admitting: Family Medicine

## 2016-10-20 DIAGNOSIS — E785 Hyperlipidemia, unspecified: Secondary | ICD-10-CM | POA: Diagnosis not present

## 2016-10-20 DIAGNOSIS — M5412 Radiculopathy, cervical region: Secondary | ICD-10-CM | POA: Diagnosis not present

## 2016-10-20 DIAGNOSIS — I10 Essential (primary) hypertension: Secondary | ICD-10-CM | POA: Diagnosis not present

## 2016-10-20 LAB — COMPREHENSIVE METABOLIC PANEL
A/G RATIO: 1.5 (ref 1.2–2.2)
ALT: 33 IU/L — AB (ref 0–32)
AST: 40 IU/L (ref 0–40)
Albumin: 4.7 g/dL (ref 3.6–4.8)
Alkaline Phosphatase: 75 IU/L (ref 39–117)
BILIRUBIN TOTAL: 0.2 mg/dL (ref 0.0–1.2)
BUN/Creatinine Ratio: 26 (ref 12–28)
BUN: 18 mg/dL (ref 8–27)
CALCIUM: 9.8 mg/dL (ref 8.7–10.3)
CHLORIDE: 100 mmol/L (ref 96–106)
CO2: 26 mmol/L (ref 20–29)
Creatinine, Ser: 0.68 mg/dL (ref 0.57–1.00)
GFR calc non Af Amer: 92 mL/min/{1.73_m2} (ref >59)
GFR, EST AFRICAN AMERICAN: 106 mL/min/{1.73_m2} (ref >59)
GLUCOSE: 99 mg/dL (ref 65–99)
Globulin, Total: 3.2 g/dL (ref 1.5–4.5)
POTASSIUM: 4.2 mmol/L (ref 3.5–5.2)
Sodium: 141 mmol/L (ref 134–144)
TOTAL PROTEIN: 7.9 g/dL (ref 6.0–8.5)

## 2016-10-20 LAB — LIPID PANEL
Chol/HDL Ratio: 2.2 ratio (ref 0.0–4.4)
Cholesterol, Total: 104 mg/dL (ref 100–199)
HDL: 47 mg/dL (ref >39)
LDL Calculated: 48 mg/dL (ref 0–99)
Triglycerides: 44 mg/dL (ref 0–149)
VLDL Cholesterol Cal: 9 mg/dL (ref 5–40)

## 2016-10-20 MED ORDER — GABAPENTIN 100 MG PO CAPS: 100.0000 mg | ORAL_CAPSULE | Freq: Three times a day (TID) | ORAL | 1 refills | Status: DC | PRN

## 2016-10-20 MED ORDER — HYDROCHLOROTHIAZIDE 25 MG PO TABS: 25.0000 mg | ORAL_TABLET | Freq: Every day | ORAL | 1 refills | Status: DC

## 2016-10-20 MED ORDER — SIMVASTATIN 40 MG PO TABS: 40.0000 mg | ORAL_TABLET | Freq: Every day | ORAL | 1 refills | Status: DC

## 2016-10-20 NOTE — Patient Instructions (Addendum)
Ok to use gabapentin 1 to 2 pills up to 3 times per day.  If not helping, or pain worsening again - return for neck Xray and possible prednisone treatment.   Follow up with pain management with your back issues.   I will check your electrolytes and cholesterol levels. No change in medication doses for now.  Return to the clinic or go to the nearest emergency room if any of your symptoms worsen or new symptoms occur.    IF you received an x-ray today, you will receive an invoice from [facility]. Please contact [facility] at [phone] with questions or concerns regarding your invoice.   IF you received labwork today, you will receive an invoice from [place]. Please contact [facility] at [phone] with questions or concerns regarding your invoice.   Our billing staff will not be able to assist you with questions regarding bills from these companies.  You will be contacted with the lab results as soon as they are available. The fastest way to get your results is to activate your My Chart account. Instructions are located on the last page of this paperwork. If you have not heard from [place] regarding the results in 2 weeks, please contact this office.

## 2016-10-20 NOTE — Progress Notes (Signed)
Subjective:    Patient ID: [name], female    DOB: [birth year], 66 y.o.   MRN: [id number]  HPI [name] is a 66 y.o. female Presents today for: Chief Complaint  Patient presents with  . Follow-up    not sure    History of hypertension, hyperlipidemia. Last visit in February. She has been under the care of orthopedics, Dr. Ninfa Linden for hip issues.  Medicare wellness visit 5 days ago.  Hypertension She takes hydrochlorothiazide 25 mg daily. No new side effects.   Lab Results  Component Value Date   CREATININE 0.74 03/16/2016    Hyperlipidemia Takes Zocor 40 mg daily. Fasting today.  No new side effects, or new myalgias.  Lab Results  Component Value Date   CHOL 127 03/16/2016   HDL 56 03/16/2016   LDLCALC 58 03/16/2016   TRIG 67 03/16/2016   CHOLHDL 2.3 03/16/2016   Lab Results  Component Value Date   ALT 25 03/16/2016   AST 30 03/16/2016   ALKPHOS 82 03/16/2016   BILITOT 0.4 03/16/2016   L Arm Pain History of cervical radiculopathy with intermittent flares, uses gabapentin as needed - gabapentin 100 mg 1-2 3 times a day when necessary. More frequent - now every day for past week. Taking up to 2 gabapentin, but only BID. No side effects of gabapentin. Feels like it may be getting better this am - lifting arm better. Last XR c spine indicated mild DDD C4-7 on 01/18/15. Taking hydrocodone with arm pain - followed by pain management. taking mm relaxant once per day.   History of back pain/spasms. She has used Voltaren and Flexeril as needed in the past, then tizanidine. Has hydrocodone, and followed by pain management.   Patient Active Problem List   Diagnosis Date Noted  . Trochanteric bursitis, left hip 07/02/2016  . Essential hypertension 03/16/2016  . Hyperlipidemia 03/16/2016  . SBO (small bowel obstruction) ([place]) 01/30/2013  . Tobacco abuse 01/30/2013  . Degenerative arthritis of left hip 01/24/2013  . Status post THR (total hip replacement)  01/24/2013   Past Medical History:  Diagnosis Date  . Chronic bronchitis ([place])    "when I get a cold I normally get bronchitis" (01/30/2013)  . Hepatitis C   . Hyperlipidemia   . Hypertension    Past Surgical History:  Procedure Laterality Date  . ABDOMINAL HYSTERECTOMY  1990's  . BACK SURGERY     x5  . CHOLECYSTECTOMY  1990's  . COLONOSCOPY    . JOINT REPLACEMENT Left 01/24/2013   hip  . LUMBAR SPINE SURGERY  2010; 2011   "tightened up some screws" (01/30/2013)  . POSTERIOR LUMBAR FUSION  2006 X 2; 2008  . TOTAL HIP ARTHROPLASTY Left 01/24/2013   Procedure: LEFT TOTAL HIP ARTHROPLASTY ANTERIOR APPROACH;  Surgeon: Mcarthur Rossetti, MD;  Location: [facility];  Service: Orthopedics;  Laterality: Left;  . TUBAL LIGATION  1970's   No Known Allergies Prior to Admission medications   Medication Sig Start Date End Date Taking? Authorizing Provider  aspirin 81 MG tablet Take 81 mg by mouth daily.   Yes [provider]  gabapentin (NEURONTIN) 100 MG capsule Take 1-2 capsules (100-200 mg total) by mouth 3 (three) times daily as needed. 03/16/16  Yes Wendie Agreste, MD  hydrochlorothiazide (HYDRODIURIL) 25 MG tablet Take 1 tablet (25 mg total) by mouth daily. 06/29/16  Yes Wendie Agreste, MD  HYDROcodone-acetaminophen [facility]) 10-325 MG tablet Take 1 tablet by mouth every 8 (  eight) hours as needed.   Yes [provider]  Misc. Devices [facility]) MISC 1 Device by Does not apply route as needed. Needed for low back pain, history of lumbar surgery, rollator insufficient for walking due to bursitis and back pain. 08/27/16  Yes Wendie Agreste, MD  simvastatin (ZOCOR) 40 MG tablet Take 1 tablet (40 mg total) by mouth daily. 03/16/16  Yes Wendie Agreste, MD  tiZANidine (ZANAFLEX) 4 MG tablet Take 4 mg by mouth every 8 (eight) hours as needed for muscle spasms.   Yes [provider]   Social History   Social History  . Marital status: Married    Spouse name:  N/A  . Number of children: N/A  . Years of education: N/A   Occupational History  . Not on file.   Social History Main Topics  . Smoking status: Former Smoker    Packs/day: 1.00    Years: 45.00    Types: Cigarettes    Quit date: 05/25/2015  . Smokeless tobacco: Never Used  . Alcohol use No     Comment: 01/30/2013 "used to drink beer; none since ~ 1989"  . Drug use: No  . Sexual activity: Yes    Birth control/ protection: Post-menopausal   Other Topics Concern  . Not on file   Social History Narrative  . No narrative on file    Review of Systems  Constitutional: Negative for fatigue and unexpected weight change.  Respiratory: Negative for chest tightness and shortness of breath.   Cardiovascular: Negative for chest pain, palpitations and leg swelling.  Gastrointestinal: Negative for abdominal pain and blood in stool.  Musculoskeletal: Positive for neck pain.  Neurological: Negative for dizziness, syncope, weakness, light-headedness and headaches.       Objective:   Physical Exam  Constitutional: She is oriented to person, place, and time. She appears well-developed and well-nourished.  HENT:  Head: Normocephalic and atraumatic.  Eyes: Pupils are equal, round, and reactive to light. Conjunctivae and EOM are normal.  Neck: Carotid bruit is not present.  Cardiovascular: Normal rate, regular rhythm, normal heart sounds and intact distal pulses.   Pulmonary/Chest: Effort normal and breath sounds normal.  Abdominal: Soft. She exhibits no pulsatile midline mass. There is no tenderness.  Musculoskeletal:       Cervical back: She exhibits decreased range of motion (Slight decreased range of motion, with reproduction of symptoms with neck extension, lateral flexion. to left) and spasm (Minimal spasms left paraspinals to trapezius.). She exhibits no bony tenderness.  Neurological: She is alert and oriented to person, place, and time. She has normal strength. No sensory deficit.    Reflex Scores:      Tricep reflexes are 2+ on the right side and 1+ on the left side.      Bicep reflexes are 2+ on the right side and 2+ on the left side.      Brachioradialis reflexes are 2+ on the right side and 2+ on the left side. Skin: Skin is warm and dry.  Psychiatric: She has a normal mood and affect. Her behavior is normal.  Vitals reviewed.  Vitals:   10/20/16 1023  BP: 138/88  Pulse: 72  Resp: 17  Temp: 98.2 F (36.8 C)  TempSrc: Oral  SpO2: 98%  Weight: 190 lb (86.2 kg)  Height: 5\' 5"  (1.651 m)      Assessment & Plan:   [name] is a 66 y.o. female Cervical radiculopathy - Plan: gabapentin (NEURONTIN) 100 MG  capsule  - Improved today. Intermittent symptoms in the past.  - Can increase gabapentin to 3 times a day if needed, but RTC precautions discussed if not improving into next week as may need repeat imaging or possible prednisone. Potential side effects of prednisone were discussed. RTC precautions if worse  Essential hypertension - Plan: hydrochlorothiazide (HYDRODIURIL) 25 MG tablet, Comprehensive metabolic panel  - Borderline, but stable. Check CMP, continue same dose HCTZ.  Hyperlipidemia, unspecified hyperlipidemia type - Plan: simvastatin (ZOCOR) 40 MG tablet, Comprehensive metabolic panel, Lipid panel  - Tolerating statin, check lipid panel  Follow-up with pain management regarding back pain and possible injections.  Recheck 6 months, sooner if worse.  Meds ordered this encounter  Medications  . gabapentin (NEURONTIN) 100 MG capsule    Sig: Take 1-2 capsules (100-200 mg total) by mouth 3 (three) times daily as needed.    Dispense:  90 capsule    Refill:  1  . hydrochlorothiazide (HYDRODIURIL) 25 MG tablet    Sig: Take 1 tablet (25 mg total) by mouth daily.    Dispense:  90 tablet    Refill:  1  . simvastatin (ZOCOR) 40 MG tablet    Sig: Take 1 tablet (40 mg total) by mouth daily.    Dispense:  90 tablet    Refill:  1   Patient  Instructions   Ok to use gabapentin 1 to 2 pills up to 3 times per day.  If not helping, or pain worsening again - return for neck Xray and possible prednisone treatment.   Follow up with pain management with your back issues.   I will check your electrolytes and cholesterol levels. No change in medication doses for now.  Return to the clinic or go to the nearest emergency room if any of your symptoms worsen or new symptoms occur.    IF you received an x-ray today, you will receive an invoice from [facility]. Please contact [facility] at [phone] with questions or concerns regarding your invoice.   IF you received labwork today, you will receive an invoice from [place]. Please contact [facility] at [phone] with questions or concerns regarding your invoice.   Our billing staff will not be able to assist you with questions regarding bills from these companies.  You will be contacted with the lab results as soon as they are available. The fastest way to get your results is to activate your My Chart account. Instructions are located on the last page of this paperwork. If you have not heard from [place] regarding the results in 2 weeks, please contact this office.      Signed,   Merri Ray, MD [facility].  10/20/16 11:13 AM

## 2016-10-27 ENCOUNTER — Encounter: Payer: Self-pay | Admitting: Family Medicine

## 2016-10-27 ENCOUNTER — Ambulatory Visit
Admission: RE | Admit: 2016-10-27 | Discharge: 2016-10-27 | Disposition: A | Discharge status: Home / Self Care | Payer: PPO | Source: Ambulatory Visit | Attending: Family Medicine | Admitting: Family Medicine

## 2016-10-27 DIAGNOSIS — Z780 Asymptomatic menopausal state: Secondary | ICD-10-CM | POA: Diagnosis not present

## 2016-10-27 DIAGNOSIS — M85851 Other specified disorders of bone density and structure, right thigh: Secondary | ICD-10-CM | POA: Diagnosis not present

## 2016-11-01 DIAGNOSIS — M5137 Other intervertebral disc degeneration, lumbosacral region: Secondary | ICD-10-CM | POA: Diagnosis not present

## 2016-11-01 DIAGNOSIS — I10 Essential (primary) hypertension: Secondary | ICD-10-CM | POA: Diagnosis not present

## 2016-11-01 DIAGNOSIS — M545 Low back pain: Secondary | ICD-10-CM | POA: Diagnosis not present

## 2016-11-01 DIAGNOSIS — M7062 Trochanteric bursitis, left hip: Secondary | ICD-10-CM | POA: Diagnosis not present

## 2016-11-07 ENCOUNTER — Telehealth: Payer: Self-pay | Admitting: Family Medicine

## 2016-11-07 DIAGNOSIS — M5412 Radiculopathy, cervical region: Secondary | ICD-10-CM

## 2016-11-07 NOTE — Telephone Encounter (Signed)
Pt is calling to request a refill of her gabapentin.  She states that she tried to refill it since it says that she has one but the pharmacy will not fill it.  Pt states that she is taking 2 tablets, 3x a day and she states Carlota Raspberry told her to double up at night time and that's why she is running out so quick.  She states she only has 4 tablets left.  Please advise pt.  [phone]

## 2016-11-11 MED ORDER — GABAPENTIN 100 MG PO CAPS: 100.0000 mg | ORAL_CAPSULE | Freq: Three times a day (TID) | ORAL | 1 refills | Status: DC | PRN

## 2016-11-11 NOTE — Telephone Encounter (Signed)
Refilled for #180 with 1 refill to reflect dosing of 2 tabs tid.

## 2016-11-16 DIAGNOSIS — H524 Presbyopia: Secondary | ICD-10-CM | POA: Diagnosis not present

## 2016-12-01 DIAGNOSIS — I10 Essential (primary) hypertension: Secondary | ICD-10-CM | POA: Diagnosis not present

## 2016-12-01 DIAGNOSIS — M7062 Trochanteric bursitis, left hip: Secondary | ICD-10-CM | POA: Diagnosis not present

## 2016-12-01 DIAGNOSIS — M545 Low back pain: Secondary | ICD-10-CM | POA: Diagnosis not present

## 2016-12-01 DIAGNOSIS — M5137 Other intervertebral disc degeneration, lumbosacral region: Secondary | ICD-10-CM | POA: Diagnosis not present

## 2016-12-10 DIAGNOSIS — M961 Postlaminectomy syndrome, not elsewhere classified: Secondary | ICD-10-CM | POA: Diagnosis not present

## 2016-12-10 DIAGNOSIS — M5137 Other intervertebral disc degeneration, lumbosacral region: Secondary | ICD-10-CM | POA: Diagnosis not present

## 2016-12-10 DIAGNOSIS — Z6831 Body mass index (BMI) 31.0-31.9, adult: Secondary | ICD-10-CM | POA: Diagnosis not present

## 2017-01-01 DIAGNOSIS — M5137 Other intervertebral disc degeneration, lumbosacral region: Secondary | ICD-10-CM | POA: Diagnosis not present

## 2017-01-01 DIAGNOSIS — M7062 Trochanteric bursitis, left hip: Secondary | ICD-10-CM | POA: Diagnosis not present

## 2017-01-01 DIAGNOSIS — I10 Essential (primary) hypertension: Secondary | ICD-10-CM | POA: Diagnosis not present

## 2017-01-01 DIAGNOSIS — M545 Low back pain: Secondary | ICD-10-CM | POA: Diagnosis not present

## 2017-01-05 DIAGNOSIS — M961 Postlaminectomy syndrome, not elsewhere classified: Secondary | ICD-10-CM | POA: Diagnosis not present

## 2017-01-31 DIAGNOSIS — M545 Low back pain: Secondary | ICD-10-CM | POA: Diagnosis not present

## 2017-01-31 DIAGNOSIS — I10 Essential (primary) hypertension: Secondary | ICD-10-CM | POA: Diagnosis not present

## 2017-01-31 DIAGNOSIS — M5137 Other intervertebral disc degeneration, lumbosacral region: Secondary | ICD-10-CM | POA: Diagnosis not present

## 2017-01-31 DIAGNOSIS — M7062 Trochanteric bursitis, left hip: Secondary | ICD-10-CM | POA: Diagnosis not present

## 2017-02-04 DIAGNOSIS — M5137 Other intervertebral disc degeneration, lumbosacral region: Secondary | ICD-10-CM | POA: Diagnosis not present

## 2017-02-04 DIAGNOSIS — M961 Postlaminectomy syndrome, not elsewhere classified: Secondary | ICD-10-CM | POA: Diagnosis not present

## 2017-02-04 DIAGNOSIS — I10 Essential (primary) hypertension: Secondary | ICD-10-CM | POA: Diagnosis not present

## 2017-02-04 DIAGNOSIS — Z6830 Body mass index (BMI) 30.0-30.9, adult: Secondary | ICD-10-CM | POA: Diagnosis not present

## 2017-03-03 DIAGNOSIS — I10 Essential (primary) hypertension: Secondary | ICD-10-CM | POA: Diagnosis not present

## 2017-03-03 DIAGNOSIS — M7062 Trochanteric bursitis, left hip: Secondary | ICD-10-CM | POA: Diagnosis not present

## 2017-03-03 DIAGNOSIS — M5137 Other intervertebral disc degeneration, lumbosacral region: Secondary | ICD-10-CM | POA: Diagnosis not present

## 2017-03-03 DIAGNOSIS — M545 Low back pain: Secondary | ICD-10-CM | POA: Diagnosis not present

## 2017-03-22 ENCOUNTER — Encounter (INDEPENDENT_AMBULATORY_CARE_PROVIDER_SITE_OTHER): Payer: Self-pay | Admitting: Orthopaedic Surgery

## 2017-03-22 ENCOUNTER — Ambulatory Visit (INDEPENDENT_AMBULATORY_CARE_PROVIDER_SITE_OTHER): Payer: PPO | Admitting: Orthopaedic Surgery

## 2017-03-22 ENCOUNTER — Ambulatory Visit (INDEPENDENT_AMBULATORY_CARE_PROVIDER_SITE_OTHER): Payer: PPO

## 2017-03-22 DIAGNOSIS — M7062 Trochanteric bursitis, left hip: Secondary | ICD-10-CM

## 2017-03-22 DIAGNOSIS — Z96642 Presence of left artificial hip joint: Secondary | ICD-10-CM

## 2017-03-22 MED ORDER — LIDOCAINE HCL 1 % IJ SOLN
3.0000 mL | INTRAMUSCULAR | Status: AC | PRN
  Administered 2017-03-22: 3 mL

## 2017-03-22 MED ORDER — METHYLPREDNISOLONE ACETATE 40 MG/ML IJ SUSP
40.0000 mg | INTRAMUSCULAR | Status: AC | PRN
  Administered 2017-03-22: 40 mg via INTRA_ARTICULAR

## 2017-03-22 NOTE — Progress Notes (Signed)
Office Visit Note   Patient: [name]           Date of Birth: [birth year]           MRN: [id number] Visit Date: 03/22/2017              Requested by: Wendie Agreste, MD [address] [place], [place] [address] PCP: Wendie Agreste, MD   Assessment & Plan: Visit Diagnoses:  1. History of left hip replacement   2. Trochanteric bursitis, left hip     Plan: I feel that there are several components of her pain.  There is certainly trochanteric bursitis and IT band syndrome component but also likely a sciatic component.  She may end up benefiting from physical therapy the most and also talked about a trochanteric injection which she agreed to try.  The risk and benefits of this were explained to her in detail.  I reassured her that her x-rays look normal of her left hip replacement I do not see anything complicating about arthroplasty at all.  She will follow-up as needed.  Follow-Up Instructions: Return if symptoms worsen or fail to improve.   Orders:  Orders Placed This Encounter  Procedures  . Large Joint Inj  . XR HIP UNILAT W OR W/O PELVIS 1V LEFT   No orders of the defined types were placed in this encounter.     Procedures: Large Joint Inj: L greater trochanter on 03/22/2017 9:12 AM Indications: pain and diagnostic evaluation Details: 22 G 1.5 in needle, lateral approach  Arthrogram: No  Medications: 3 mL lidocaine 1 %; 40 mg methylPREDNISolone acetate 40 MG/ML Outcome: tolerated well, no immediate complications Procedure, treatment alternatives, risks and benefits explained, specific risks discussed. Consent was given by the patient. Immediately prior to procedure a time out was called to verify the correct patient, procedure, equipment, support staff and site/side marked as required. Patient was prepped and draped in the usual sterile fashion.       Clinical Data: No additional findings.   Subjective: Chief Complaint  Patient presents with  . Left  Leg - Pain  The patient comes in with chief complaint of left hip pain.  She says his entire leg below the hurts.  She does have a history of a left total hip arthroplasty that we performed in December 2014.  She is also had significant lumbar spine surgery by neurosurgeon here in town.  He says the pain is mainly with weightbearing and other activities.  she ambulates with a rolling walker.  She points to her entire hip and leg as a source of her pain.  She denies any recent injuries.  She denies being a diabetic.  She denies any change in bowel or bladder function.  She denies any headache, chest pain, shortness of breath, fever, chills, nausea, vomiting.  HPI  Review of Systems She denies any headache, chest pain short of breath, fever, chills, nausea, vomiting.  Objective: Vital Signs: There were no vitals taken for this visit.  Physical Exam She is alert and oriented x3 and in no acute distress Ortho Exam Examination of her left hip shows that he moves fluidly actively and passively and is full.  She does have some groin pain most of her pain is over the sciatic region as well as the trochanteric area down the IT band and into the thigh. Specialty Comments:  No specialty comments available.  Imaging: Xr Hip Unilat W Or W/o Pelvis 1v Left  Result  Date: 03/22/2017 An AP pelvis and lateral of her left hip shows a total hip arthroplasty with no complicating features.  There is no evidence of loosening or ostial lysis.  These were compared to previous films over 4-year period of time.    PMFS History: Patient Active Problem List   Diagnosis Date Noted  . Trochanteric bursitis, left hip 07/02/2016  . Essential hypertension 03/16/2016  . Hyperlipidemia 03/16/2016  . SBO (small bowel obstruction) ([place]) 01/30/2013  . Tobacco abuse 01/30/2013  . Degenerative arthritis of left hip 01/24/2013  . Status post THR (total hip replacement) 01/24/2013   Past Medical History:  Diagnosis Date    . Chronic bronchitis ([place])    "when I get a cold I normally get bronchitis" (01/30/2013)  . Hepatitis C   . Hyperlipidemia   . Hypertension     Family History  Problem Relation Age of Onset  . Throat cancer Mother   . Breast cancer Sister   . Prostate cancer Brother     Past Surgical History:  Procedure Laterality Date  . ABDOMINAL HYSTERECTOMY  1990's  . BACK SURGERY     x5  . CHOLECYSTECTOMY  1990's  . COLONOSCOPY    . JOINT REPLACEMENT Left 01/24/2013   hip  . LUMBAR SPINE SURGERY  2010; 2011   "tightened up some screws" (01/30/2013)  . POSTERIOR LUMBAR FUSION  2006 X 2; 2008  . TOTAL HIP ARTHROPLASTY Left 01/24/2013   Procedure: LEFT TOTAL HIP ARTHROPLASTY ANTERIOR APPROACH;  Surgeon: Mcarthur Rossetti, MD;  Location: [facility];  Service: Orthopedics;  Laterality: Left;  . TUBAL LIGATION  1970's   Social History   Occupational History  . Not on file  Tobacco Use  . Smoking status: Former Smoker    Packs/day: 1.00    Years: 45.00    Pack years: 45.00    Types: Cigarettes    Last attempt to quit: 05/25/2015    Years since quitting: 1.8  . Smokeless tobacco: Never Used  Substance and Sexual Activity  . Alcohol use: No    Alcohol/week: 0.0 oz    Comment: 01/30/2013 "used to drink beer; none since ~ 1989"  . Drug use: No  . Sexual activity: Yes    Birth control/protection: Post-menopausal

## 2017-04-03 DIAGNOSIS — M5137 Other intervertebral disc degeneration, lumbosacral region: Secondary | ICD-10-CM | POA: Diagnosis not present

## 2017-04-03 DIAGNOSIS — M545 Low back pain: Secondary | ICD-10-CM | POA: Diagnosis not present

## 2017-04-03 DIAGNOSIS — M7062 Trochanteric bursitis, left hip: Secondary | ICD-10-CM | POA: Diagnosis not present

## 2017-04-03 DIAGNOSIS — I10 Essential (primary) hypertension: Secondary | ICD-10-CM | POA: Diagnosis not present

## 2017-04-06 DIAGNOSIS — M961 Postlaminectomy syndrome, not elsewhere classified: Secondary | ICD-10-CM | POA: Diagnosis not present

## 2017-04-23 ENCOUNTER — Other Ambulatory Visit: Payer: Self-pay | Admitting: Family Medicine

## 2017-04-23 DIAGNOSIS — E785 Hyperlipidemia, unspecified: Secondary | ICD-10-CM

## 2017-05-01 DIAGNOSIS — I10 Essential (primary) hypertension: Secondary | ICD-10-CM | POA: Diagnosis not present

## 2017-05-01 DIAGNOSIS — M5137 Other intervertebral disc degeneration, lumbosacral region: Secondary | ICD-10-CM | POA: Diagnosis not present

## 2017-05-01 DIAGNOSIS — M7062 Trochanteric bursitis, left hip: Secondary | ICD-10-CM | POA: Diagnosis not present

## 2017-05-01 DIAGNOSIS — M545 Low back pain: Secondary | ICD-10-CM | POA: Diagnosis not present

## 2017-05-04 DIAGNOSIS — M5137 Other intervertebral disc degeneration, lumbosacral region: Secondary | ICD-10-CM | POA: Diagnosis not present

## 2017-05-04 DIAGNOSIS — Z6829 Body mass index (BMI) 29.0-29.9, adult: Secondary | ICD-10-CM | POA: Diagnosis not present

## 2017-05-04 DIAGNOSIS — I10 Essential (primary) hypertension: Secondary | ICD-10-CM | POA: Diagnosis not present

## 2017-05-04 DIAGNOSIS — M961 Postlaminectomy syndrome, not elsewhere classified: Secondary | ICD-10-CM | POA: Diagnosis not present

## 2017-05-31 ENCOUNTER — Other Ambulatory Visit: Payer: Self-pay | Admitting: Family Medicine

## 2017-05-31 DIAGNOSIS — E785 Hyperlipidemia, unspecified: Secondary | ICD-10-CM

## 2017-06-01 DIAGNOSIS — M5137 Other intervertebral disc degeneration, lumbosacral region: Secondary | ICD-10-CM | POA: Diagnosis not present

## 2017-06-01 DIAGNOSIS — M545 Low back pain: Secondary | ICD-10-CM | POA: Diagnosis not present

## 2017-06-01 DIAGNOSIS — M7062 Trochanteric bursitis, left hip: Secondary | ICD-10-CM | POA: Diagnosis not present

## 2017-06-01 DIAGNOSIS — I10 Essential (primary) hypertension: Secondary | ICD-10-CM | POA: Diagnosis not present

## 2017-06-24 ENCOUNTER — Other Ambulatory Visit: Payer: Self-pay | Admitting: Family Medicine

## 2017-06-24 DIAGNOSIS — Z1231 Encounter for screening mammogram for malignant neoplasm of breast: Secondary | ICD-10-CM

## 2017-07-01 DIAGNOSIS — M545 Low back pain: Secondary | ICD-10-CM | POA: Diagnosis not present

## 2017-07-01 DIAGNOSIS — M7062 Trochanteric bursitis, left hip: Secondary | ICD-10-CM | POA: Diagnosis not present

## 2017-07-01 DIAGNOSIS — M5137 Other intervertebral disc degeneration, lumbosacral region: Secondary | ICD-10-CM | POA: Diagnosis not present

## 2017-07-01 DIAGNOSIS — I10 Essential (primary) hypertension: Secondary | ICD-10-CM | POA: Diagnosis not present

## 2017-07-06 DIAGNOSIS — M5136 Other intervertebral disc degeneration, lumbar region: Secondary | ICD-10-CM | POA: Diagnosis not present

## 2017-07-17 ENCOUNTER — Other Ambulatory Visit: Payer: Self-pay | Admitting: Family Medicine

## 2017-07-17 DIAGNOSIS — I10 Essential (primary) hypertension: Secondary | ICD-10-CM

## 2017-07-22 ENCOUNTER — Ambulatory Visit: Payer: PPO

## 2017-08-01 DIAGNOSIS — M7062 Trochanteric bursitis, left hip: Secondary | ICD-10-CM | POA: Diagnosis not present

## 2017-08-01 DIAGNOSIS — M545 Low back pain: Secondary | ICD-10-CM | POA: Diagnosis not present

## 2017-08-01 DIAGNOSIS — I10 Essential (primary) hypertension: Secondary | ICD-10-CM | POA: Diagnosis not present

## 2017-08-01 DIAGNOSIS — M5137 Other intervertebral disc degeneration, lumbosacral region: Secondary | ICD-10-CM | POA: Diagnosis not present

## 2017-08-09 DIAGNOSIS — M961 Postlaminectomy syndrome, not elsewhere classified: Secondary | ICD-10-CM | POA: Diagnosis not present

## 2017-08-09 DIAGNOSIS — Z6829 Body mass index (BMI) 29.0-29.9, adult: Secondary | ICD-10-CM | POA: Diagnosis not present

## 2017-08-09 DIAGNOSIS — M5136 Other intervertebral disc degeneration, lumbar region: Secondary | ICD-10-CM | POA: Diagnosis not present

## 2017-08-09 DIAGNOSIS — I10 Essential (primary) hypertension: Secondary | ICD-10-CM | POA: Diagnosis not present

## 2017-08-22 ENCOUNTER — Other Ambulatory Visit: Payer: Self-pay | Admitting: Family Medicine

## 2017-08-22 DIAGNOSIS — I10 Essential (primary) hypertension: Secondary | ICD-10-CM

## 2017-08-23 NOTE — Telephone Encounter (Signed)
Left message for pt to call and make an OV for refills. Will fill for 15 days.  HCTZ refill Last Refill:07/19/17 # 30 Last OV: 10/20/16 PCP: Dr Carlota Raspberry Pharmacy: [facility].

## 2017-08-25 ENCOUNTER — Other Ambulatory Visit: Payer: Self-pay

## 2017-08-25 ENCOUNTER — Ambulatory Visit (INDEPENDENT_AMBULATORY_CARE_PROVIDER_SITE_OTHER): Payer: PPO | Admitting: Physician Assistant

## 2017-08-25 ENCOUNTER — Encounter: Payer: Self-pay | Admitting: Physician Assistant

## 2017-08-25 ENCOUNTER — Ambulatory Visit (INDEPENDENT_AMBULATORY_CARE_PROVIDER_SITE_OTHER): Payer: PPO

## 2017-08-25 VITALS — BP 138/77 | HR 68 | Temp 98.6°F | Ht 64.5 in | Wt 180.0 lb

## 2017-08-25 DIAGNOSIS — Z760 Encounter for issue of repeat prescription: Secondary | ICD-10-CM

## 2017-08-25 DIAGNOSIS — E785 Hyperlipidemia, unspecified: Secondary | ICD-10-CM | POA: Diagnosis not present

## 2017-08-25 DIAGNOSIS — M25551 Pain in right hip: Secondary | ICD-10-CM

## 2017-08-25 DIAGNOSIS — I10 Essential (primary) hypertension: Secondary | ICD-10-CM

## 2017-08-25 MED ORDER — HYDROCHLOROTHIAZIDE 25 MG PO TABS: 25.0000 mg | ORAL_TABLET | Freq: Every day | ORAL | 0 refills | Status: DC

## 2017-08-25 MED ORDER — SIMVASTATIN 40 MG PO TABS: 40.0000 mg | ORAL_TABLET | Freq: Every day | ORAL | 0 refills | Status: DC

## 2017-08-25 MED ORDER — NAPROXEN 375 MG PO TBEC: 375.0000 mg | DELAYED_RELEASE_TABLET | Freq: Two times a day (BID) | ORAL | 0 refills | Status: DC

## 2017-08-25 NOTE — Progress Notes (Signed)
08/25/2017 1:09 PM   DOB: [birth year] / MRN: [id number]  SUBJECTIVE:  [name] is a 67 y.o. female presenting for right sided pelvis pain (as she points to the right acetabulum. Symptoms present for chronically but worse over the last few days.  The problem is worsening and is debilitating. She has tried narcotics which do help but she had to take an extra tab today just to ambulate.   She has No Known Allergies.   She  has a past medical history of Chronic bronchitis ([place]), Hepatitis C, Hyperlipidemia, and Hypertension.    She  reports that she quit smoking about 2 years ago. Her smoking use included cigarettes. She has a 45.00 pack-year smoking history. She has never used smokeless tobacco. She reports that she does not drink alcohol or use drugs. She  reports that she currently engages in sexual activity. She reports using the following method of birth control/protection: Post-menopausal. The patient  has a past surgical history that includes Back surgery; Colonoscopy; Joint replacement (Left, 01/24/2013); Total hip arthroplasty (Left, 01/24/2013); Posterior lumbar fusion (2006 X 2; 2008); Lumbar spine surgery (2010; 2011); Abdominal hysterectomy (1990's); Tubal ligation (1970's); and Cholecystectomy (1990's).  Her family history includes Breast cancer in her sister; Prostate cancer in her brother; Throat cancer in her mother.  Review of Systems  Gastrointestinal: Negative for abdominal pain, blood in stool, constipation, diarrhea, melena, nausea and vomiting.  Genitourinary: Negative for dysuria, frequency and urgency.  Neurological: Negative for weakness.    The problem list and medications were reviewed and updated by myself where necessary and exist elsewhere in the encounter.   OBJECTIVE:  BP 138/77 (BP Location: Left Arm, Patient Position: Sitting, Cuff Size: Normal)   Pulse 68   Temp 98.6 F (37 C) (Oral)   Ht 5' 4.5" (1.638 m)   Wt 180 lb (81.6 kg)   SpO2 97%   BMI  30.42 kg/m   Wt Readings from Last 3 Encounters:  08/25/17 180 lb (81.6 kg)  10/20/16 190 lb (86.2 kg)  10/15/16 189 lb (85.7 kg)   Temp Readings from Last 3 Encounters:  08/25/17 98.6 F (37 C) (Oral)  10/20/16 98.2 F (36.8 C) (Oral)  10/15/16 (!) 97.5 F (36.4 C) (Oral)   BP Readings from Last 3 Encounters:  08/25/17 138/77  10/20/16 138/88  10/15/16 130/75   Pulse Readings from Last 3 Encounters:  08/25/17 68  10/20/16 72  10/15/16 68    Physical Exam  Constitutional: She is oriented to person, place, and time. She appears well-nourished. No distress.  Eyes: Pupils are equal, round, and reactive to light. EOM are normal.  Cardiovascular: Normal rate.  Pulmonary/Chest: Effort normal.  Abdominal: Soft. Bowel sounds are normal. She exhibits no distension and no mass. There is no tenderness. There is no rebound and no guarding. No hernia.  Musculoskeletal: She exhibits no edema, tenderness or deformity.  + right acetabular grind, worse with aBduction and aDduction. Gait antalgic with walker. No bursal TTP.  No hip weakness on challenge.   Neurological: She is alert and oriented to person, place, and time. No cranial nerve deficit. Gait normal.  Skin: Skin is dry. She is not diaphoretic.  Psychiatric: She has a normal mood and affect.  Vitals reviewed.  Dg Hip Unilat W Or W/o Pelvis 2-3 Views Right  Result Date: 08/25/2017 CLINICAL DATA:  RIGHT hip pain EXAM: DG HIP (WITH OR WITHOUT PELVIS) 2-3V RIGHT COMPARISON:  To 12/2017 FINDINGS: Osseous demineralization. LEFT hip  prosthesis without acute complication. SI joints preserved. RIGHT hip joint space preserved. No acute fracture, dislocation or bone destruction. Extensive sclerosis throughout the LEFT pubic body and LEFT superior pubic ramus as well as the LEFT ilium adjacent to the SI joint. Question sclerosis at the RIGHT pubic body. Prior lumbar surgery/fusion. IMPRESSION: No acute RIGHT hip abnormalities. Sclerotic regions  at the LEFT pubic body and superior pubic ramus, LEFT iliac bone, and questionably RIGHT pubic body, sclerotic osseous metastases not excluded; further evaluation by radionuclide bone scan recommended. Electronically Signed   By: Lavonia Dana M.D.   On: 08/25/2017 13:02      ASSESSMENT AND PLAN:  [name] was seen today for pain and medication refill.  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 Xray shows possible sclerosis vs. Mets about the right hip. Colonoscopy 2 year ago and negative.  Chest CT with some small nodes but no follow up eval in 2015. History of abdominal hysterectomy and no paps done. Mammography current and negative. Cancer seems unlikely. Will follow rad reqs to further rule in/out mets.  -     DG HIP UNILAT W OR W/O PELVIS 2-3 VIEWS RIGHT; Future  Medication refill -     simvastatin (ZOCOR) 40 MG tablet; Take 1 tablet (40 mg total) by mouth daily. -     hydrochlorothiazide (HYDRODIURIL) 25 MG tablet; Take 1 tablet (25 mg total) by mouth daily.  Hyperlipidemia, unspecified hyperlipidemia type Comments: Refilling for one month. RTC to see Dr. Carlota Raspberry,  Orders: -     simvastatin (ZOCOR) 40 MG tablet; Take 1 tablet (40 mg total) by mouth daily.  Essential hypertension Comments: Controlled.  One mont. RTC for primary.  Orders: -     hydrochlorothiazide (HYDRODIURIL) 25 MG tablet; Take 1 tablet (25 mg total) by mouth daily.    The patient is advised to call or return to clinic if she does not see an improvement in symptoms, or to seek the care of the closest emergency department if she worsens with the above plan.   Philis Fendt, MHS, PA-C [facility] 08/25/2017 1:09 PM

## 2017-08-25 NOTE — Patient Instructions (Addendum)
Ill be in touch regarding imaging.      IF you received an x-ray today, you will receive an invoice from [facility]. Please contact [facility] at [phone] with questions or concerns regarding your invoice.   IF you received labwork today, you will receive an invoice from [facility]. Please contact [facility] at [phone] with questions or concerns regarding your invoice.   Our billing staff will not be able to assist you with questions regarding bills from these companies.  You will be contacted with the lab results as soon as they are available. The fastest way to get your results is to activate your My Chart account. Instructions are located on the last page of this paperwork. If you have not heard from [place] regarding the results in 2 weeks, please contact this office.

## 2017-08-26 ENCOUNTER — Encounter (HOSPITAL_COMMUNITY)
Admission: RE | Admit: 2017-08-26 | Discharge: 2017-08-26 | Disposition: A | Discharge status: Home / Self Care | Payer: PPO | Source: Ambulatory Visit | Attending: Physician Assistant | Admitting: Physician Assistant

## 2017-08-26 ENCOUNTER — Telehealth: Payer: Self-pay | Admitting: Physician Assistant

## 2017-08-26 DIAGNOSIS — Z471 Aftercare following joint replacement surgery: Secondary | ICD-10-CM | POA: Diagnosis not present

## 2017-08-26 DIAGNOSIS — M25551 Pain in right hip: Secondary | ICD-10-CM | POA: Insufficient documentation

## 2017-08-26 DIAGNOSIS — Z96642 Presence of left artificial hip joint: Secondary | ICD-10-CM | POA: Diagnosis not present

## 2017-08-26 MED ORDER — TECHNETIUM TC 99M MEDRONATE IV KIT: 21.8000 | PACK | Freq: Once | INTRAVENOUS | Status: DC | PRN

## 2017-08-26 MED ORDER — TECHNETIUM TC 99M MEDRONATE IV KIT
21.8000 | PACK | Freq: Once | INTRAVENOUS | Status: AC | PRN
  Administered 2017-08-26: 21.8 via INTRAVENOUS

## 2017-08-26 NOTE — Telephone Encounter (Signed)
Received auth for pt nm scan but when I went to schedule the scan susan from the [facility] called the nuclear meds department to schedule they stated that it needs to be put in as a whole body nm scan.  please advise

## 2017-08-26 NOTE — Telephone Encounter (Signed)
[name] from [facility] called me back and stated that the nuclear meds called her and stated instead of whole body the order needs to be put in as three phase. Sorry for any confusion but that's what they told me.

## 2017-08-26 NOTE — Telephone Encounter (Signed)
Please ask her to order what she needs and I will co-sign.

## 2017-08-26 NOTE — Telephone Encounter (Signed)
They sent order over for the e signature for you to sign. Thanks!

## 2017-08-27 NOTE — Progress Notes (Signed)
Please call patient.  No evidence of bone cancer on most recent scan. Given this pain I think it is best that she follow up with her chronic pain provider. Philis Fendt, MS, PA-C 1:17 PM, 08/27/2017

## 2017-08-31 DIAGNOSIS — M545 Low back pain: Secondary | ICD-10-CM | POA: Diagnosis not present

## 2017-08-31 DIAGNOSIS — M5137 Other intervertebral disc degeneration, lumbosacral region: Secondary | ICD-10-CM | POA: Diagnosis not present

## 2017-08-31 DIAGNOSIS — I10 Essential (primary) hypertension: Secondary | ICD-10-CM | POA: Diagnosis not present

## 2017-08-31 DIAGNOSIS — M7062 Trochanteric bursitis, left hip: Secondary | ICD-10-CM | POA: Diagnosis not present

## 2017-09-06 ENCOUNTER — Ambulatory Visit (INDEPENDENT_AMBULATORY_CARE_PROVIDER_SITE_OTHER): Payer: PPO | Admitting: Family Medicine

## 2017-09-06 ENCOUNTER — Encounter: Payer: Self-pay | Admitting: Family Medicine

## 2017-09-06 VITALS — BP 128/72 | HR 62 | Temp 98.0°F | Ht 64.0 in | Wt 161.8 lb

## 2017-09-06 DIAGNOSIS — E785 Hyperlipidemia, unspecified: Secondary | ICD-10-CM

## 2017-09-06 DIAGNOSIS — M25559 Pain in unspecified hip: Secondary | ICD-10-CM | POA: Diagnosis not present

## 2017-09-06 DIAGNOSIS — G8929 Other chronic pain: Secondary | ICD-10-CM | POA: Diagnosis not present

## 2017-09-06 DIAGNOSIS — M549 Dorsalgia, unspecified: Secondary | ICD-10-CM

## 2017-09-06 DIAGNOSIS — R21 Rash and other nonspecific skin eruption: Secondary | ICD-10-CM | POA: Diagnosis not present

## 2017-09-06 DIAGNOSIS — I10 Essential (primary) hypertension: Secondary | ICD-10-CM | POA: Diagnosis not present

## 2017-09-06 LAB — LIPID PANEL
CHOLESTEROL TOTAL: 129 mg/dL (ref 100–199)
Chol/HDL Ratio: 2.3 ratio (ref 0.0–4.4)
HDL: 55 mg/dL (ref >39)
LDL Calculated: 64 mg/dL (ref 0–99)
Triglycerides: 51 mg/dL (ref 0–149)
VLDL Cholesterol Cal: 10 mg/dL (ref 5–40)

## 2017-09-06 LAB — COMPREHENSIVE METABOLIC PANEL
A/G RATIO: 1.9 (ref 1.2–2.2)
ALK PHOS: 78 IU/L (ref 39–117)
ALT: 25 IU/L (ref 0–32)
AST: 30 IU/L (ref 0–40)
Albumin: 4.5 g/dL (ref 3.6–4.8)
BUN/Creatinine Ratio: 22 (ref 12–28)
BUN: 18 mg/dL (ref 8–27)
Bilirubin Total: 0.3 mg/dL (ref 0.0–1.2)
CO2: 25 mmol/L (ref 20–29)
Calcium: 9.7 mg/dL (ref 8.7–10.3)
Chloride: 104 mmol/L (ref 96–106)
Creatinine, Ser: 0.81 mg/dL (ref 0.57–1.00)
GFR calc Af Amer: 88 mL/min/{1.73_m2} (ref >59)
GFR calc non Af Amer: 76 mL/min/{1.73_m2} (ref >59)
Globulin, Total: 2.4 g/dL (ref 1.5–4.5)
Glucose: 91 mg/dL (ref 65–99)
Potassium: 4.3 mmol/L (ref 3.5–5.2)
Sodium: 144 mmol/L (ref 134–144)
Total Protein: 6.9 g/dL (ref 6.0–8.5)

## 2017-09-06 MED ORDER — HYDROCHLOROTHIAZIDE 25 MG PO TABS: 25.0000 mg | ORAL_TABLET | Freq: Every day | ORAL | 1 refills | Status: DC

## 2017-09-06 MED ORDER — SIMVASTATIN 40 MG PO TABS: 40.0000 mg | ORAL_TABLET | Freq: Every day | ORAL | 1 refills | Status: DC

## 2017-09-06 NOTE — Progress Notes (Signed)
Subjective:  By signing my name below, I, Delton Coombes, attest that this documentation has been prepared under the direction and in the presence of Wendie Agreste, MD Electronically Signed: Delton Coombes Medical Scribe 09/06/2017 at 9:40 AM   Patient ID: [name], female    DOB: 1950/05/19, 66 y.o.   MRN: [id number]  Chief Complaint  Patient presents with  . Medication Refill    Hydroclorothiazide and simvastatin (short supply refilled on 08/25/2017)  . Handicap placard    has form so she get these again  . left arm rash    going away right now.    HPI [name] is a 67 y.o. female who presents to Primary Care at [facility] complaining of multiple concerns.   Hyperlipidemia She was last seen in September 2018, planned for follow up at this time. She takes Simvastatin 40 mg daily, no new side effects reported.  Lab Results  Component Value Date   CREATININE 0.68 10/20/2016     Hypertension Lab Results  Component Value Date   CHOL 104 10/20/2016   HDL 47 10/20/2016   LDLCALC 48 10/20/2016   TRIG 44 10/20/2016   CHOLHDL 2.2 10/20/2016   Lab Results  Component Value Date   ALT 33 (H) 10/20/2016   AST 40 10/20/2016   ALKPHOS 75 10/20/2016   BILITOT 0.2 10/20/2016   She takes hydrochlorothiazide 25 mg once a day, no side effects from this medication.  Request for handicap Placard She has a history of Left total hip replacement in 2014. She had lumbar spine surgery in 2010 and 2011 Posterior lumbar fusion in 2006 and in 2008.   Rash on Arm Patient reports having a rash on left arm and on right neck. She has a picture of the rash that was noted to be hyperpigmented patch with fine papules. Reports improved rash today.     Patient Active Problem List   Diagnosis Date Noted  . Trochanteric bursitis, left hip 07/02/2016  . Essential hypertension 03/16/2016  . Hyperlipidemia 03/16/2016  . SBO (small bowel obstruction) ([place]) 01/30/2013  . Tobacco abuse  01/30/2013  . Degenerative arthritis of left hip 01/24/2013  . Status post THR (total hip replacement) 01/24/2013   Past Medical History:  Diagnosis Date  . Chronic bronchitis ([place])    "when I get a cold I normally get bronchitis" (01/30/2013)  . Hepatitis C   . Hyperlipidemia   . Hypertension    Past Surgical History:  Procedure Laterality Date  . ABDOMINAL HYSTERECTOMY  1990's  . BACK SURGERY     x5  . CHOLECYSTECTOMY  1990's  . COLONOSCOPY    . JOINT REPLACEMENT Left 01/24/2013   hip  . LUMBAR SPINE SURGERY  2010; 2011   "tightened up some screws" (01/30/2013)  . POSTERIOR LUMBAR FUSION  2006 X 2; 2008  . TOTAL HIP ARTHROPLASTY Left 01/24/2013   Procedure: LEFT TOTAL HIP ARTHROPLASTY ANTERIOR APPROACH;  Surgeon: Mcarthur Rossetti, MD;  Location: [facility];  Service: Orthopedics;  Laterality: Left;  . TUBAL LIGATION  1970's   No Known Allergies Prior to Admission medications   Medication Sig Start Date End Date Taking? Authorizing Provider  aspirin 81 MG tablet Take 81 mg by mouth daily.    [provider]  hydrochlorothiazide (HYDRODIURIL) 25 MG tablet Take 1 tablet (25 mg total) by mouth daily. 08/25/17   Tereasa Coop, PA-C  HYDROcodone-acetaminophen (NORCO) 10-325 MG tablet Take 1 tablet by mouth every 8 (eight)  hours as needed.    [provider]  methocarbamol (ROBAXIN) 500 MG tablet Take 500 mg by mouth 4 (four) times daily.    [provider]  Misc. Devices [facility]) MISC 1 Device by Does not apply route as needed. Needed for low back pain, history of lumbar surgery, rollator insufficient for walking due to bursitis and back pain. 08/27/16   Wendie Agreste, MD  Naproxen (NAPROXEN) 375 MG TBEC Take 1 tablet (375 mg total) by mouth 2 (two) times daily. Take 1000 mg tylenol every 8 hours along with this and your chronic pain meds. 08/25/17   Tereasa Coop, PA-C  simvastatin (ZOCOR) 40 MG tablet Take 1 tablet (40 mg total) by mouth  daily. 08/25/17   Tereasa Coop, PA-C  tiZANidine (ZANAFLEX) 4 MG tablet Take 4 mg by mouth every 8 (eight) hours as needed for muscle spasms.    [provider]   Social History   Socioeconomic History  . Marital status: Married    Spouse name: Not on file  . Number of children: Not on file  . Years of education: Not on file  . Highest education level: Not on file  Occupational History  . Not on file  Social Needs  . Financial resource strain: Not on file  . Food insecurity:    Worry: Not on file    Inability: Not on file  . Transportation needs:    Medical: Not on file    Non-medical: Not on file  Tobacco Use  . Smoking status: Former Smoker    Packs/day: 1.00    Years: 45.00    Pack years: 45.00    Types: Cigarettes    Last attempt to quit: 05/25/2015    Years since quitting: 2.2  . Smokeless tobacco: Never Used  Substance and Sexual Activity  . Alcohol use: No    Alcohol/week: 0.0 oz    Comment: 01/30/2013 "used to drink beer; none since ~ 1989"  . Drug use: No  . Sexual activity: Yes    Birth control/protection: Post-menopausal  Lifestyle  . Physical activity:    Days per week: Not on file    Minutes per session: Not on file  . Stress: Not on file  Relationships  . Social connections:    Talks on phone: Not on file    Gets together: Not on file    Attends religious service: Not on file    Active member of club or organization: Not on file    Attends meetings of clubs or organizations: Not on file    Relationship status: Not on file  . Intimate partner violence:    Fear of current or ex partner: Not on file    Emotionally abused: Not on file    Physically abused: Not on file    Forced sexual activity: Not on file  Other Topics Concern  . Not on file  Social History Narrative  . Not on file    Review of Systems  Constitutional: Negative for fatigue and unexpected weight change.  Respiratory: Negative for chest tightness and shortness of  breath.   Cardiovascular: Negative for chest pain, palpitations and leg swelling.  Gastrointestinal: Negative for abdominal pain and blood in stool.  Neurological: Negative for dizziness, syncope, light-headedness and headaches.       Objective:   Physical Exam  Constitutional: She is oriented to person, place, and time. She appears well-developed and well-nourished.  HENT:  Head: Normocephalic and atraumatic.  Eyes:  Pupils are equal, round, and reactive to light. Conjunctivae and EOM are normal.  Neck: Carotid bruit is not present.  Cardiovascular: Normal rate, regular rhythm, normal heart sounds and intact distal pulses.  Pulmonary/Chest: Effort normal and breath sounds normal.  Abdominal: Soft. She exhibits no pulsatile midline mass. There is no tenderness.  Neurological: She is alert and oriented to person, place, and time.  Skin: Skin is warm and dry.  She has a fairly flat, approximal 3 by 5 cm patch with fine papules, minimal scale. Similar appearing patch over the right shoulder.  Psychiatric: She has a normal mood and affect. Her behavior is normal.  Vitals reviewed.  Vitals:   09/06/17 0918  BP: 128/72  Pulse: 62  Temp: 98 F (36.7 C)  TempSrc: Oral  SpO2: 98%  Weight: 161 lb 12.8 oz (73.4 kg)  Height: 5\' 4"  (1.626 m)       Assessment & Plan:   [name] is a 67 y.o. female Essential hypertension - Controlled.  One mont. RTC for primary.  - Plan: hydrochlorothiazide (HYDRODIURIL) 25 MG tablet, Comprehensive metabolic panel  -  Stable, tolerating current regimen. Medications refilled. Labs pending as above.   Hyperlipidemia, unspecified hyperlipidemia type - Refilling for one month. RTC to see Dr. Carlota Raspberry,  - Plan: simvastatin (ZOCOR) 40 MG tablet, Lipid panel  -  Stable, tolerating current regimen. Medications refilled. Labs pending as above.   Rash and nonspecific skin eruption  -Differential includes contact dermatitis based on 2 locations, unlikely  zoster given different dermatomes.  Tinea corporis possible as improving with topical antifungal.  RTC precautions if not continuing to improve.  Chronic back and hip pain.  Uses cane, walker, or other assistive device.  Handicap placard provided.  Meds ordered this encounter  Medications  . hydrochlorothiazide (HYDRODIURIL) 25 MG tablet    Sig: Take 1 tablet (25 mg total) by mouth daily.    Dispense:  90 tablet    Refill:  1  . simvastatin (ZOCOR) 40 MG tablet    Sig: Take 1 tablet (40 mg total) by mouth daily.    Dispense:  90 tablet    Refill:  1   Patient Instructions   His rash is improving, no changes for now.  If you notice any spread of that rash or worsening, please return for recheck.  Additionally follow-up there if there are other concerns that were not addressed today.  Continue same medication for blood pressure and cholesterol.  I will check lab work.  Follow-up in 6 months for physical.    IF you received an x-ray today, you will receive an invoice from [facility]. Please contact [facility] at [phone] with questions or concerns regarding your invoice.   IF you received labwork today, you will receive an invoice from [place]. Please contact [facility] at [phone] with questions or concerns regarding your invoice.   Our billing staff will not be able to assist you with questions regarding bills from these companies.  You will be contacted with the lab results as soon as they are available. The fastest way to get your results is to activate your My Chart account. Instructions are located on the last page of this paperwork. If you have not heard from [place] regarding the results in 2 weeks, please contact this office.       I personally performed the services described in this documentation, which was scribed in my presence. The recorded information has been reviewed and considered for accuracy  and completeness, addended by me as needed, and agree  with information above.  Signed,   Merri Ray, MD [facility].  09/06/17 10:06 AM

## 2017-09-06 NOTE — Patient Instructions (Addendum)
His rash is improving, no changes for now.  If you notice any spread of that rash or worsening, please return for recheck.  Additionally follow-up there if there are other concerns that were not addressed today.  Continue same medication for blood pressure and cholesterol.  I will check lab work.  Follow-up in 6 months for physical.    IF you received an x-ray today, you will receive an invoice from [facility]. Please contact [facility] at [phone] with questions or concerns regarding your invoice.   IF you received labwork today, you will receive an invoice from [facility]. Please contact [facility] at [phone] with questions or concerns regarding your invoice.   Our billing staff will not be able to assist you with questions regarding bills from these companies.  You will be contacted with the lab results as soon as they are available. The fastest way to get your results is to activate your My Chart account. Instructions are located on the last page of this paperwork. If you have not heard from [place] regarding the results in 2 weeks, please contact this office.

## 2017-09-07 ENCOUNTER — Ambulatory Visit
Admission: RE | Admit: 2017-09-07 | Discharge: 2017-09-07 | Disposition: A | Discharge status: Home / Self Care | Payer: PPO | Source: Ambulatory Visit | Attending: Family Medicine | Admitting: Family Medicine

## 2017-09-07 DIAGNOSIS — Z1231 Encounter for screening mammogram for malignant neoplasm of breast: Secondary | ICD-10-CM

## 2017-09-09 ENCOUNTER — Other Ambulatory Visit: Payer: Self-pay | Admitting: Family Medicine

## 2017-09-09 DIAGNOSIS — R928 Other abnormal and inconclusive findings on diagnostic imaging of breast: Secondary | ICD-10-CM

## 2017-09-14 ENCOUNTER — Ambulatory Visit
Admission: RE | Admit: 2017-09-14 | Discharge: 2017-09-14 | Disposition: A | Discharge status: Home / Self Care | Payer: PPO | Source: Ambulatory Visit | Attending: Family Medicine | Admitting: Family Medicine

## 2017-09-14 ENCOUNTER — Other Ambulatory Visit: Payer: Self-pay | Admitting: Family Medicine

## 2017-09-14 DIAGNOSIS — N6001 Solitary cyst of right breast: Secondary | ICD-10-CM | POA: Diagnosis not present

## 2017-09-14 DIAGNOSIS — R928 Other abnormal and inconclusive findings on diagnostic imaging of breast: Secondary | ICD-10-CM

## 2017-09-14 DIAGNOSIS — N6310 Unspecified lump in the right breast, unspecified quadrant: Secondary | ICD-10-CM

## 2017-09-21 ENCOUNTER — Other Ambulatory Visit: Payer: Self-pay | Admitting: Physician Assistant

## 2017-10-01 DIAGNOSIS — M5137 Other intervertebral disc degeneration, lumbosacral region: Secondary | ICD-10-CM | POA: Diagnosis not present

## 2017-10-01 DIAGNOSIS — I10 Essential (primary) hypertension: Secondary | ICD-10-CM | POA: Diagnosis not present

## 2017-10-01 DIAGNOSIS — M7062 Trochanteric bursitis, left hip: Secondary | ICD-10-CM | POA: Diagnosis not present

## 2017-10-01 DIAGNOSIS — M545 Low back pain: Secondary | ICD-10-CM | POA: Diagnosis not present

## 2017-10-07 DIAGNOSIS — M5136 Other intervertebral disc degeneration, lumbar region: Secondary | ICD-10-CM | POA: Diagnosis not present

## 2017-11-12 DIAGNOSIS — M5136 Other intervertebral disc degeneration, lumbar region: Secondary | ICD-10-CM | POA: Diagnosis not present

## 2017-11-12 DIAGNOSIS — M961 Postlaminectomy syndrome, not elsewhere classified: Secondary | ICD-10-CM | POA: Diagnosis not present

## 2018-01-03 DIAGNOSIS — M5136 Other intervertebral disc degeneration, lumbar region: Secondary | ICD-10-CM | POA: Diagnosis not present

## 2018-01-31 ENCOUNTER — Encounter: Payer: Self-pay | Admitting: Family Medicine

## 2018-02-01 ENCOUNTER — Emergency Department (HOSPITAL_COMMUNITY): Payer: PPO

## 2018-02-01 ENCOUNTER — Emergency Department (HOSPITAL_COMMUNITY)
Admission: EM | Admit: 2018-02-01 | Discharge: 2018-02-01 | Disposition: A | Discharge status: Home / Self Care | Payer: PPO | Attending: Emergency Medicine | Admitting: Emergency Medicine

## 2018-02-01 ENCOUNTER — Encounter (HOSPITAL_COMMUNITY): Payer: Self-pay | Admitting: Emergency Medicine

## 2018-02-01 DIAGNOSIS — R300 Dysuria: Secondary | ICD-10-CM | POA: Insufficient documentation

## 2018-02-01 DIAGNOSIS — Z79899 Other long term (current) drug therapy: Secondary | ICD-10-CM | POA: Diagnosis not present

## 2018-02-01 DIAGNOSIS — R110 Nausea: Secondary | ICD-10-CM | POA: Diagnosis not present

## 2018-02-01 DIAGNOSIS — M791 Myalgia, unspecified site: Secondary | ICD-10-CM | POA: Diagnosis not present

## 2018-02-01 DIAGNOSIS — I10 Essential (primary) hypertension: Secondary | ICD-10-CM | POA: Diagnosis not present

## 2018-02-01 DIAGNOSIS — R112 Nausea with vomiting, unspecified: Secondary | ICD-10-CM | POA: Diagnosis not present

## 2018-02-01 DIAGNOSIS — R079 Chest pain, unspecified: Secondary | ICD-10-CM | POA: Diagnosis not present

## 2018-02-01 DIAGNOSIS — Z87891 Personal history of nicotine dependence: Secondary | ICD-10-CM | POA: Insufficient documentation

## 2018-02-01 DIAGNOSIS — R51 Headache: Secondary | ICD-10-CM | POA: Diagnosis not present

## 2018-02-01 LAB — URINALYSIS, ROUTINE W REFLEX MICROSCOPIC
Bilirubin Urine: NEGATIVE
GLUCOSE, UA: NEGATIVE mg/dL
Hgb urine dipstick: NEGATIVE
Ketones, ur: 5 mg/dL — AB
NITRITE: NEGATIVE
PH: 5 (ref 5.0–8.0)
Protein, ur: NEGATIVE mg/dL
SPECIFIC GRAVITY, URINE: 1.016 (ref 1.005–1.030)

## 2018-02-01 LAB — COMPREHENSIVE METABOLIC PANEL
ALT: 26 U/L (ref 0–44)
ANION GAP: 12 (ref 5–15)
AST: 28 U/L (ref 15–41)
Albumin: 4.7 g/dL (ref 3.5–5.0)
Alkaline Phosphatase: 56 U/L (ref 38–126)
BUN: 23 mg/dL (ref 8–23)
CO2: 30 mmol/L (ref 22–32)
Calcium: 10.1 mg/dL (ref 8.9–10.3)
Chloride: 93 mmol/L — ABNORMAL LOW (ref 98–111)
Creatinine, Ser: 0.8 mg/dL (ref 0.44–1.00)
Glucose, Bld: 107 mg/dL — ABNORMAL HIGH (ref 70–99)
POTASSIUM: 3.7 mmol/L (ref 3.5–5.1)
Sodium: 135 mmol/L (ref 135–145)
TOTAL PROTEIN: 8.3 g/dL — AB (ref 6.5–8.1)
Total Bilirubin: 1 mg/dL (ref 0.3–1.2)

## 2018-02-01 LAB — CBC
HCT: 40.8 % (ref 36.0–46.0)
HEMOGLOBIN: 13.6 g/dL (ref 12.0–15.0)
MCH: 30.8 pg (ref 26.0–34.0)
MCHC: 33.3 g/dL (ref 30.0–36.0)
MCV: 92.5 fL (ref 80.0–100.0)
NRBC: 0 % (ref 0.0–0.2)
Platelets: 227 10*3/uL (ref 150–400)
RBC: 4.41 MIL/uL (ref 3.87–5.11)
RDW: 12.1 % (ref 11.5–15.5)
WBC: 9.4 10*3/uL (ref 4.0–10.5)

## 2018-02-01 LAB — LIPASE, BLOOD: LIPASE: 34 U/L (ref 11–51)

## 2018-02-01 MED ORDER — SODIUM CHLORIDE 0.9 % IV BOLUS
1000.0000 mL | Freq: Once | INTRAVENOUS | Status: AC
  Administered 2018-02-01: 1000 mL via INTRAVENOUS

## 2018-02-01 MED ORDER — ONDANSETRON HCL 4 MG/2ML IJ SOLN: 4.0000 mg | Freq: Once | INTRAMUSCULAR | Status: DC

## 2018-02-01 MED ORDER — ACETAMINOPHEN 325 MG PO TABS
650.0000 mg | ORAL_TABLET | Freq: Once | ORAL | Status: AC
  Administered 2018-02-01: 650 mg via ORAL
  Filled 2018-02-01: qty 2

## 2018-02-01 MED ORDER — ONDANSETRON 4 MG PO TBDP
4.0000 mg | ORAL_TABLET | Freq: Once | ORAL | Status: AC | PRN
  Administered 2018-02-01: 4 mg via ORAL
  Filled 2018-02-01: qty 1

## 2018-02-01 MED ORDER — ONDANSETRON HCL 4 MG PO TABS: 4.0000 mg | ORAL_TABLET | Freq: Three times a day (TID) | ORAL | 0 refills | Status: AC | PRN

## 2018-02-01 NOTE — ED Provider Notes (Signed)
[facility] DEPT Provider Note   CSN: [id number] Arrival date & time: 02/01/18  0620     History   Chief Complaint Chief Complaint  Patient presents with  . Nausea  . Leg Pain  . Dysuria    HPI [name] is a 67 y.o. female.  The history is provided by the patient.  Emesis   This is a new problem. The current episode started yesterday. Episode frequency: mostly nausea, has not vomited yet. The problem has been resolved. The emesis has an appearance of stomach contents. There has been no fever. The fever has been present for less than 1 day. Associated symptoms include chills, headaches, myalgias and URI. Pertinent negatives include no abdominal pain, no arthralgias, no cough, no diarrhea, no fever and no sweats. Risk factors include ill contacts.    Past Medical History:  Diagnosis Date  . Chronic bronchitis ([place])    "when I get a cold I normally get bronchitis" (01/30/2013)  . Hepatitis C   . Hyperlipidemia   . Hypertension     Patient Active Problem List   Diagnosis Date Noted  . Trochanteric bursitis, left hip 07/02/2016  . Essential hypertension 03/16/2016  . Hyperlipidemia 03/16/2016  . SBO (small bowel obstruction) ([place]) 01/30/2013  . Tobacco abuse 01/30/2013  . Degenerative arthritis of left hip 01/24/2013  . Status post THR (total hip replacement) 01/24/2013    Past Surgical History:  Procedure Laterality Date  . ABDOMINAL HYSTERECTOMY  1990's  . BACK SURGERY     x5  . CHOLECYSTECTOMY  1990's  . COLONOSCOPY    . JOINT REPLACEMENT Left 01/24/2013   hip  . LUMBAR SPINE SURGERY  2010; 2011   "tightened up some screws" (01/30/2013)  . POSTERIOR LUMBAR FUSION  2006 X 2; 2008  . TOTAL HIP ARTHROPLASTY Left 01/24/2013   Procedure: LEFT TOTAL HIP ARTHROPLASTY ANTERIOR APPROACH;  Surgeon: Mcarthur Rossetti, MD;  Location: [facility];  Service: Orthopedics;  Laterality: Left;  . TUBAL LIGATION  1970's     OB History     No obstetric history on file.      Home Medications    Prior to Admission medications   Medication Sig Start Date End Date Taking? Authorizing Provider  aspirin 81 MG tablet Take 81 mg by mouth daily.   Yes [provider]  hydrochlorothiazide (HYDRODIURIL) 25 MG tablet Take 1 tablet (25 mg total) by mouth daily. 09/06/17  Yes Wendie Agreste, MD  HYDROcodone-acetaminophen (NORCO) 10-325 MG tablet Take 1 tablet by mouth every 8 (eight) hours as needed (pain).    Yes [provider]  methocarbamol (ROBAXIN) 500 MG tablet Take 500 mg by mouth 4 (four) times daily.   Yes [provider]  Misc. Devices [facility]) MISC 1 Device by Does not apply route as needed. Needed for low back pain, history of lumbar surgery, rollator insufficient for walking due to bursitis and back pain. 08/27/16  Yes Wendie Agreste, MD  Olopatadine HCl (PAZEO) 0.7 % SOLN Place 1 drop into both eyes daily.   Yes [provider]  simvastatin (ZOCOR) 40 MG tablet Take 1 tablet (40 mg total) by mouth daily. 09/06/17  Yes Wendie Agreste, MD  NAPROXEN 375 MG TBEC EC tablet TAKE 1 TABLET BY MOUTH 2 TIMES DAILY. TAKE 1000MG  TYLENOL EVERY 8 HOURS WITH THIS AND YOUR CHRONIC PAIN MEDS Patient not taking: Reported on 02/01/2018 09/21/17   Wendie Agreste, MD  ondansetron [facility]) 4  MG tablet Take 1 tablet (4 mg total) by mouth every 8 (eight) hours as needed for up to 15 days for nausea or vomiting. 02/01/18 02/16/18  Lennice Sites, DO    Family History Family History  Problem Relation Age of Onset  . Throat cancer Mother   . Breast cancer Sister   . Prostate cancer Brother     Social History Social History   Tobacco Use  . Smoking status: Former Smoker    Packs/day: 1.00    Years: 45.00    Pack years: 45.00    Types: Cigarettes    Last attempt to quit: 05/25/2015    Years since quitting: 2.6  . Smokeless tobacco: Never Used  Substance Use Topics  . Alcohol use: No     Alcohol/week: 0.0 standard drinks    Comment: 01/30/2013 "used to drink beer; none since ~ 1989"  . Drug use: No     Allergies   Patient has no known allergies.   Review of Systems Review of Systems  Constitutional: Positive for chills. Negative for fever.  HENT: Negative for ear pain and sore throat.   Eyes: Negative for pain and visual disturbance.  Respiratory: Negative for cough and shortness of breath.   Cardiovascular: Negative for chest pain and palpitations.  Gastrointestinal: Positive for vomiting. Negative for abdominal pain and diarrhea.  Genitourinary: Negative for dysuria and hematuria.  Musculoskeletal: Positive for myalgias. Negative for arthralgias and back pain.  Skin: Negative for color change and rash.  Neurological: Positive for headaches. Negative for seizures and syncope.  All other systems reviewed and are negative.    Physical Exam Updated Vital Signs  ED Triage Vitals  Enc Vitals Group     BP 02/01/18 0711 122/82     Pulse Rate 02/01/18 0711 79     Resp 02/01/18 0711 19     Temp 02/01/18 0711 98.2 F (36.8 C)     Temp Source 02/01/18 0711 Oral     SpO2 02/01/18 0711 97 %     Weight --      Height --      Head Circumference --      Peak Flow --      Pain Score 02/01/18 0709 8     Pain Loc --      Pain Edu? --      Excl. in [place]? --     Physical Exam Vitals signs and nursing note reviewed.  Constitutional:      General: She is not in acute distress.    Appearance: She is well-developed.  HENT:     Head: Normocephalic and atraumatic.     Nose: Nose normal.     Mouth/Throat:     Mouth: Mucous membranes are moist.     Pharynx: Oropharynx is clear.  Eyes:     Extraocular Movements: Extraocular movements intact.     Conjunctiva/sclera: Conjunctivae normal.     Pupils: Pupils are equal, round, and reactive to light.  Neck:     Musculoskeletal: Normal range of motion and neck supple.  Cardiovascular:     Rate and Rhythm: Normal rate and  regular rhythm.     Pulses: Normal pulses.     Heart sounds: Normal heart sounds. No murmur.  Pulmonary:     Effort: Pulmonary effort is normal. No respiratory distress.     Breath sounds: Normal breath sounds.  Abdominal:     General: There is no distension.     Palpations: Abdomen is soft.  Tenderness: There is no abdominal tenderness.  Musculoskeletal: Normal range of motion.        General: No swelling, tenderness, deformity or signs of injury.     Right lower leg: No edema.     Left lower leg: No edema.  Skin:    General: Skin is warm and dry.     Capillary Refill: Capillary refill takes less than 2 seconds.  Neurological:     General: No focal deficit present.     Mental Status: She is alert. She is disoriented.     Cranial Nerves: No cranial nerve deficit.     Sensory: No sensory deficit.     Motor: No weakness.     Coordination: Coordination normal.     Deep Tendon Reflexes: Reflexes normal.  Psychiatric:        Mood and Affect: Mood normal.      ED Treatments / Results  Labs (all labs ordered are listed, but only abnormal results are displayed) Labs Reviewed  COMPREHENSIVE METABOLIC PANEL - Abnormal; Notable for the following components:      Result Value   Chloride 93 (*)    Glucose, Bld 107 (*)    Total Protein 8.3 (*)    All other components within normal limits  URINALYSIS, ROUTINE W REFLEX MICROSCOPIC - Abnormal; Notable for the following components:   APPearance HAZY (*)    Ketones, ur 5 (*)    Leukocytes, UA LARGE (*)    Bacteria, UA RARE (*)    All other components within normal limits  LIPASE, BLOOD  CBC    EKG None  Radiology Dg Chest 2 View  Result Date: 02/01/2018 CLINICAL DATA:  Nausea and diffuse pain for several days. EXAM: CHEST - 2 VIEW COMPARISON:  PA and lateral chest 10/14/2013.  CT chest 10/26/2013. FINDINGS: The lungs are clear. Heart size is normal. Aortic atherosclerosis is noted. No pneumothorax or pleural effusion. No  acute or focal bony abnormality. There is partial visualization of lumbar fusion hardware. IMPRESSION: No acute disease. Atherosclerosis. Electronically Signed   By: Inge Rise M.D.   On: 02/01/2018 09:38    Procedures Procedures (including critical care time)  Medications Ordered in ED Medications  ondansetron (ZOFRAN-ODT) disintegrating tablet 4 mg (4 mg Oral Given 02/01/18 0713)  sodium chloride 0.9 % bolus 1,000 mL (0 mLs Intravenous Stopped 02/01/18 1146)  acetaminophen (TYLENOL) tablet 650 mg (650 mg Oral Given 02/01/18 1145)     Initial Impression / Assessment and Plan / ED Course  I have reviewed the triage vital signs and the nursing notes.  Pertinent labs & imaging results that were available during my care of the patient were reviewed by me and considered in my medical decision making (see chart for details).     [name] is a 67 year old female with history of chronic bronchitis who presents to the ED with nausea, body aches.  Patient with normal vitals.  No fever.  Patient overall well-appearing on exam.  Neurologically intact.  Clear breath sounds.  Patient complains of body aches, viral type symptoms.  Has had a cough, nausea.  No vomiting.  Has had difficulty with appetite and drinking.  She appears mildly dry on exam.  Lab work showed no significant anemia, electrolyte abnormality, kidney injury.  Chest x-ray shows no signs of pneumonia, pneumothorax, pleural effusion.  Urinalysis negative for infection.  Has mild ketones.  Suggest mild dehydration.  Patient felt improved following Zofran, IV fluids.  Body aches have improved.  She was also given Tylenol as well.  Likely patient with viral process.  No chest pain, no shortness of breath.  Doubt any cardiac process.  Recommend increase hydration at home and discharged from the ED in good condition.  Given return precautions.  Recommend follow-up with primary care doctor.  This chart was dictated using voice  recognition software.  Despite best efforts to proofread,  errors can occur which can change the documentation meaning.   Final Clinical Impressions(s) / ED Diagnoses   Final diagnoses:  Nausea    ED Discharge Orders         Ordered    ondansetron (ZOFRAN) 4 MG tablet  Every 8 hours PRN     02/01/18 1150           Kyston Gonce, DO 02/01/18 1155

## 2018-02-01 NOTE — ED Triage Notes (Signed)
Pt reports that she has been nauseated and "bones hurting" for several days. Pt c/o pains in legs, hips, and urine been dark and burns some with urination.

## 2018-02-14 ENCOUNTER — Encounter: Payer: Self-pay | Admitting: Family Medicine

## 2018-02-16 DIAGNOSIS — I10 Essential (primary) hypertension: Secondary | ICD-10-CM | POA: Diagnosis not present

## 2018-02-16 DIAGNOSIS — M961 Postlaminectomy syndrome, not elsewhere classified: Secondary | ICD-10-CM | POA: Diagnosis not present

## 2018-03-01 ENCOUNTER — Other Ambulatory Visit: Payer: Self-pay | Admitting: Family Medicine

## 2018-03-01 DIAGNOSIS — I10 Essential (primary) hypertension: Secondary | ICD-10-CM

## 2018-03-01 NOTE — Telephone Encounter (Signed)
Requested Prescriptions  Pending Prescriptions Disp Refills  . hydrochlorothiazide (HYDRODIURIL) 25 MG tablet [Pharmacy Med Name: HYDROCHLOROTHIAZIDE 25MG  TABLETS] 90 tablet 1    Sig: TAKE 1 TABLET(25 MG) BY MOUTH DAILY     Cardiovascular: Diuretics - Thiazide Passed - 03/01/2018  3:18 AM      Passed - Ca in normal range and within 360 days    Calcium  Date Value Ref Range Status  02/01/2018 10.1 8.9 - 10.3 mg/dL Final         Passed - Cr in normal range and within 360 days    Creat  Date Value Ref Range Status  11/21/2014 0.64 0.50 - 0.99 mg/dL Final   Creatinine, Ser  Date Value Ref Range Status  02/01/2018 0.80 0.44 - 1.00 mg/dL Final         Passed - K in normal range and within 360 days    Potassium  Date Value Ref Range Status  02/01/2018 3.7 3.5 - 5.1 mmol/L Final         Passed - Na in normal range and within 360 days    Sodium  Date Value Ref Range Status  02/01/2018 135 135 - 145 mmol/L Final  09/06/2017 144 134 - 144 mmol/L Final         Passed - Last BP in normal range    BP Readings from Last 1 Encounters:  02/01/18 114/87         Passed - Valid encounter within last 6 months    Recent Outpatient Visits          5 months ago Essential hypertension   [facility], MD   6 months ago Right hip pain   [facility], PA-C   1 year ago Cervical radiculopathy   [facility], Ranell Patrick, MD   1 year ago Essential hypertension   [facility], Ranell Patrick, MD   3 years ago Hyperlipidemia   [facility], MD      Future Appointments            In 1 week Carlota Raspberry Ranell Patrick, MD [facility], [facility]

## 2018-03-10 ENCOUNTER — Other Ambulatory Visit: Payer: Self-pay

## 2018-03-10 ENCOUNTER — Ambulatory Visit (INDEPENDENT_AMBULATORY_CARE_PROVIDER_SITE_OTHER): Payer: PPO | Admitting: Family Medicine

## 2018-03-10 ENCOUNTER — Encounter: Payer: Self-pay | Admitting: Family Medicine

## 2018-03-10 VITALS — BP 117/67 | HR 60 | Temp 97.9°F | Resp 14 | Ht 65.0 in | Wt 165.0 lb

## 2018-03-10 DIAGNOSIS — N6310 Unspecified lump in the right breast, unspecified quadrant: Secondary | ICD-10-CM

## 2018-03-10 DIAGNOSIS — R630 Anorexia: Secondary | ICD-10-CM

## 2018-03-10 DIAGNOSIS — E785 Hyperlipidemia, unspecified: Secondary | ICD-10-CM

## 2018-03-10 DIAGNOSIS — R634 Abnormal weight loss: Secondary | ICD-10-CM | POA: Diagnosis not present

## 2018-03-10 DIAGNOSIS — I10 Essential (primary) hypertension: Secondary | ICD-10-CM

## 2018-03-10 MED ORDER — HYDROCHLOROTHIAZIDE 25 MG PO TABS: ORAL_TABLET | ORAL | 1 refills | Status: DC

## 2018-03-10 MED ORDER — SIMVASTATIN 40 MG PO TABS: 40.0000 mg | ORAL_TABLET | Freq: Every day | ORAL | 1 refills | Status: DC

## 2018-03-10 NOTE — Patient Instructions (Addendum)
No change in blood pressure or cholesterol medication for now.    For weight loss I will check some lab work, and will watch for the results of your upcoming mammogram and ultrasound for the previous right breast concern.  Depending on those results it may be worth meeting with gastroenterology as well as you are going to be due soon for repeat colonoscopy.    Please follow-up with me in 3 to 4 weeks and we can discuss further work-up at that time or possible medication to help with appetite.  If any abdominal pain, nausea, vomiting or worsening symptoms sooner please return sooner or to the emergency room. Can try nutritional supplement like ensure once per day for now and something for breakfast each day.   Return to the clinic or go to the nearest emergency room if any of your symptoms worsen or new symptoms occur.      If you have lab work done today you will be contacted with your lab results within the next 2 weeks.  If you have not heard from [place] then please contact us. The fastest way to get your results is to register for My Chart.   IF you received an x-ray today, you will receive an invoice from [facility]. Please contact [facility] at [phone] with questions or concerns regarding your invoice.   IF you received labwork today, you will receive an invoice from [facility]. Please contact [facility] at [phone] with questions or concerns regarding your invoice.   Our billing staff will not be able to assist you with questions regarding bills from these companies.  You will be contacted with the lab results as soon as they are available. The fastest way to get your results is to activate your My Chart account. Instructions are located on the last page of this paperwork. If you have not heard from [place] regarding the results in 2 weeks, please contact this office.

## 2018-03-10 NOTE — Progress Notes (Signed)
Subjective:    Patient ID: [name], female    DOB: November 30, 1950, 68 y.o.   MRN: [id number]  HPI [name] is a 68 y.o. female Presents today for: Chief Complaint  Patient presents with  . Hypertension    6 month f/u for bp and rash. Rash has improved. Concerned about wt loss, lost 20 lb in 9 months   Hypertension: BP Readings from Last 3 Encounters:  03/10/18 117/67  02/01/18 114/87  09/06/17 128/72   Lab Results  Component Value Date   CREATININE 0.80 02/01/2018  Currently taking hydrochlorothiazide 25 mg daily. No new side effects.   Hyperlipidemia:  Lab Results  Component Value Date   CHOL 129 09/06/2017   HDL 55 09/06/2017   LDLCALC 64 09/06/2017   TRIG 51 09/06/2017   CHOLHDL 2.3 09/06/2017   Lab Results  Component Value Date   ALT 26 02/01/2018   AST 28 02/01/2018   ALKPHOS 56 02/01/2018   BILITOT 1.0 02/01/2018  On simvastatin 40 mg daily. No new side effects.  Fasting today.   Rash: Briefly discussed at her last visit in July.  Rash was improving at that time.  Had been present on her neck and left arm.  Possible contact dermatitis that was improving versus tinea corporis, but as areas were improved no specific treatment given at that time. No further rash.   Weight loss: Reports 20 pound weight loss in the last 9 months.  Weight down 15 pounds from July 2019 on our records.  Has decreased appetite for 8-9 months. Not eating breakfast. Eats sandwich in afternoon at times  - making self to eat. Some dinner - small portions.  No abd pain. No N/V since ER visit. No new cough or dyspnea.  No blood stool changes or blood in stool.  Drinking more fluid, no further dark urine. Denies depression. Feels happy, content.  Denies stressors.  Depression screen [facility] 2/9 03/10/2018 09/06/2017 08/25/2017 10/20/2016 10/15/2016  Decreased Interest 0 0 0 0 0  Down, Depressed, Hopeless 0 0 - 0 0  PHQ - 2 Score 0 0 0 0 0    Telephone message noted from January 31, 2018 with feeling faint with standing or walking, dark urine, was encouraged to schedule appointment.  She subsequently was seen at [facility], ER 1 day later with nausea.  Had overall normal electrolytes, no significant anemia, chest x-ray without sign of pneumonia, and urinalysis without apparent sign of infection.  Normal lipase, and LFTs.  Urinalysis did indicate mild ketones suggesting possible mild dehydration.  She was treated with Zofran, IV fluids with improvement in symptoms.  Suspected viral process. Colonoscopy in May 2017, 2-3 polyps removed, diverticulosis, recommended repeat testing in 3 to 5 years for surveillance She does have a history of tobacco use, chest x-ray 02/01/2018 without acute disease.  Quit smoking in 2017 with approximately 45-pack-year history. Mammogram 08/2017 - possible mass in R breast, ultrasound indicated probable benign mass - recommended 6 month recheck MM and U/S.  Bone scan July 2019 without apparent concern.  On same med regimen from pain management - no new changes and denies prior appetite suppression.   Wt Readings from Last 3 Encounters:  03/10/18 165 lb (74.8 kg)  09/06/17 161 lb 12.8 oz (73.4 kg)  08/25/17 180 lb (81.6 kg)  weight:10/2016: 190   Patient Active Problem List   Diagnosis Date Noted  . Trochanteric bursitis, left hip 07/02/2016  . Essential hypertension 03/16/2016  .  Hyperlipidemia 03/16/2016  . SBO (small bowel obstruction) ([place]) 01/30/2013  . Tobacco abuse 01/30/2013  . Degenerative arthritis of left hip 01/24/2013  . Status post THR (total hip replacement) 01/24/2013   Past Medical History:  Diagnosis Date  . Chronic bronchitis ([place])    "when I get a cold I normally get bronchitis" (01/30/2013)  . Hepatitis C   . Hyperlipidemia   . Hypertension    Past Surgical History:  Procedure Laterality Date  . ABDOMINAL HYSTERECTOMY  1990's  . BACK SURGERY     x5  . CHOLECYSTECTOMY  1990's  . COLONOSCOPY    . JOINT  REPLACEMENT Left 01/24/2013   hip  . LUMBAR SPINE SURGERY  2010; 2011   "tightened up some screws" (01/30/2013)  . POSTERIOR LUMBAR FUSION  2006 X 2; 2008  . TOTAL HIP ARTHROPLASTY Left 01/24/2013   Procedure: LEFT TOTAL HIP ARTHROPLASTY ANTERIOR APPROACH;  Surgeon: Mcarthur Rossetti, MD;  Location: [facility];  Service: Orthopedics;  Laterality: Left;  . TUBAL LIGATION  1970's   No Known Allergies Prior to Admission medications   Medication Sig Start Date End Date Taking? Authorizing Provider  aspirin 81 MG tablet Take 81 mg by mouth daily.   Yes [provider]  hydrochlorothiazide (HYDRODIURIL) 25 MG tablet TAKE 1 TABLET(25 MG) BY MOUTH DAILY 03/01/18  Yes Wendie Agreste, MD  HYDROcodone-acetaminophen (NORCO) 10-325 MG tablet Take 1 tablet by mouth every 8 (eight) hours as needed (pain).    Yes [provider]  methocarbamol (ROBAXIN) 500 MG tablet Take 500 mg by mouth 4 (four) times daily.   Yes [provider]  Misc. Devices [facility]) MISC 1 Device by Does not apply route as needed. Needed for low back pain, history of lumbar surgery, rollator insufficient for walking due to bursitis and back pain. 08/27/16  Yes Wendie Agreste, MD  NAPROXEN 375 MG TBEC EC tablet TAKE 1 TABLET BY MOUTH 2 TIMES DAILY. TAKE 1000MG  TYLENOL EVERY 8 HOURS WITH THIS AND YOUR CHRONIC PAIN MEDS 09/21/17  Yes Wendie Agreste, MD  Olopatadine HCl (PAZEO) 0.7 % SOLN Place 1 drop into both eyes daily.   Yes [provider]  simvastatin (ZOCOR) 40 MG tablet Take 1 tablet (40 mg total) by mouth daily. 09/06/17  Yes Wendie Agreste, MD   Social History   Socioeconomic History  . Marital status: Married    Spouse name: Not on file  . Number of children: Not on file  . Years of education: Not on file  . Highest education level: Not on file  Occupational History  . Not on file  Social Needs  . Financial resource strain: Not on file  . Food insecurity:    Worry: Not on  file    Inability: Not on file  . Transportation needs:    Medical: Not on file    Non-medical: Not on file  Tobacco Use  . Smoking status: Former Smoker    Packs/day: 1.00    Years: 45.00    Pack years: 45.00    Types: Cigarettes    Last attempt to quit: 05/25/2015    Years since quitting: 2.7  . Smokeless tobacco: Never Used  Substance and Sexual Activity  . Alcohol use: No    Alcohol/week: 0.0 standard drinks    Comment: 01/30/2013 "used to drink beer; none since ~ 1989"  . Drug use: No  . Sexual activity: Yes    Birth control/protection: Post-menopausal  Lifestyle  .  Physical activity:    Days per week: Not on file    Minutes per session: Not on file  . Stress: Not on file  Relationships  . Social connections:    Talks on phone: Not on file    Gets together: Not on file    Attends religious service: Not on file    Active member of club or organization: Not on file    Attends meetings of clubs or organizations: Not on file    Relationship status: Not on file  . Intimate partner violence:    Fear of current or ex partner: Not on file    Emotionally abused: Not on file    Physically abused: Not on file    Forced sexual activity: Not on file  Other Topics Concern  . Not on file  Social History Narrative  . Not on file    Review of Systems Per HPI.     Objective:   Physical Exam Vitals signs reviewed.  Constitutional:      Appearance: She is well-developed.  HENT:     Head: Normocephalic and atraumatic.     Mouth/Throat:     Mouth: Mucous membranes are moist.  Eyes:     Conjunctiva/sclera: Conjunctivae normal.     Pupils: Pupils are equal, round, and reactive to light.  Neck:     Thyroid: No thyroid mass, thyromegaly or thyroid tenderness.     Vascular: No carotid bruit.  Cardiovascular:     Rate and Rhythm: Normal rate and regular rhythm.     Heart sounds: Normal heart sounds.  Pulmonary:     Effort: Pulmonary effort is normal.     Breath sounds:  Normal breath sounds.  Abdominal:     Palpations: Abdomen is soft. There is no pulsatile mass.     Tenderness: There is no abdominal tenderness.  Skin:    General: Skin is warm and dry.  Neurological:     Mental Status: She is alert and oriented to person, place, and time.  Psychiatric:        Behavior: Behavior normal.    Vitals:   03/10/18 1046  BP: 117/67  Pulse: 60  Resp: 14  Temp: 97.9 F (36.6 C)  TempSrc: Oral  SpO2: 98%  Weight: 165 lb (74.8 kg)  Height: 5\' 5"  (1.651 m)       Assessment & Plan:   [name] is a 68 y.o. female Essential hypertension - Plan: Comprehensive metabolic panel, Lipid Panel, hydrochlorothiazide (HYDRODIURIL) 25 MG tablet  - Plan: Comprehensive metabolic panel, Lipid Panel, hydrochlorothiazide (HYDRODIURIL) 25 MG tablet  -  Stable, tolerating current regimen. Medications refilled. Labs pending as above.   Hyperlipidemia, unspecified hyperlipidemia type - Refilling for one month. RTC to see Dr. Carlota Raspberry,  - Plan: simvastatin (ZOCOR) 40 MG tablet  -  Stable, tolerating current regimen. Medications refilled. Labs pending as above.   Weight loss - Plan: Sedimentation Rate, CBC, TSH Decreased appetite  -Suspect some of the weight loss may be due to decreased appetite, but denies depression denies new side effects with her medication.  Option of nutritional supplements such as Ensure discussed.  Check sed rate, CBC, TSH.  Also will watch for results of upcoming breast imaging given previous questionable abnormality.  Would also consider follow-up with gastroenterology as due for follow-up this year for repeat colonoscopy.  Recheck in 3 to 4 weeks to discuss plan further.  RTC precautions if worse sooner  Breast mass, right - Plan: US  BREAST COMPLETE UNI RIGHT INC AXILLA ordered   Meds ordered this encounter  Medications  . hydrochlorothiazide (HYDRODIURIL) 25 MG tablet    Sig: TAKE 1 TABLET(25 MG) BY MOUTH DAILY    Dispense:  90 tablet     Refill:  1  . simvastatin (ZOCOR) 40 MG tablet    Sig: Take 1 tablet (40 mg total) by mouth daily.    Dispense:  90 tablet    Refill:  1   Patient Instructions   No change in blood pressure or cholesterol medication for now.    For weight loss I will check some lab work, and will watch for the results of your upcoming mammogram and ultrasound for the previous right breast concern.  Depending on those results it may be worth meeting with gastroenterology as well as you are going to be due soon for repeat colonoscopy.    Please follow-up with me in 3 to 4 weeks and we can discuss further work-up at that time or possible medication to help with appetite.  If any abdominal pain, nausea, vomiting or worsening symptoms sooner please return sooner or to the emergency room. Can try nutritional supplement like ensure once per day for now and something for breakfast each day.   Return to the clinic or go to the nearest emergency room if any of your symptoms worsen or new symptoms occur.      If you have lab work done today you will be contacted with your lab results within the next 2 weeks.  If you have not heard from [place] then please contact us. The fastest way to get your results is to register for My Chart.   IF you received an x-ray today, you will receive an invoice from [facility]. Please contact [facility] at [phone] with questions or concerns regarding your invoice.   IF you received labwork today, you will receive an invoice from [facility]. Please contact [facility] at [phone] with questions or concerns regarding your invoice.   Our billing staff will not be able to assist you with questions regarding bills from these companies.  You will be contacted with the lab results as soon as they are available. The fastest way to get your results is to activate your My Chart account. Instructions are located on the last page of this paperwork. If you have not heard from [place]  regarding the results in 2 weeks, please contact this office.       Signed,   Merri Ray, MD [facility] [place].  03/12/18 10:38 PM

## 2018-03-11 LAB — COMPREHENSIVE METABOLIC PANEL
A/G RATIO: 1.7 (ref 1.2–2.2)
ALBUMIN: 4.7 g/dL (ref 3.8–4.8)
ALT: 25 IU/L (ref 0–32)
AST: 26 IU/L (ref 0–40)
Alkaline Phosphatase: 69 IU/L (ref 39–117)
BILIRUBIN TOTAL: 0.4 mg/dL (ref 0.0–1.2)
BUN / CREAT RATIO: 20 (ref 12–28)
BUN: 15 mg/dL (ref 8–27)
CHLORIDE: 100 mmol/L (ref 96–106)
CO2: 24 mmol/L (ref 20–29)
Calcium: 10.2 mg/dL (ref 8.7–10.3)
Creatinine, Ser: 0.75 mg/dL (ref 0.57–1.00)
GFR calc Af Amer: 96 mL/min/{1.73_m2} (ref >59)
GFR calc non Af Amer: 83 mL/min/{1.73_m2} (ref >59)
GLUCOSE: 96 mg/dL (ref 65–99)
Globulin, Total: 2.8 g/dL (ref 1.5–4.5)
POTASSIUM: 4 mmol/L (ref 3.5–5.2)
Sodium: 142 mmol/L (ref 134–144)
Total Protein: 7.5 g/dL (ref 6.0–8.5)

## 2018-03-11 LAB — LIPID PANEL
CHOLESTEROL TOTAL: 125 mg/dL (ref 100–199)
Chol/HDL Ratio: 2.3 ratio (ref 0.0–4.4)
HDL: 54 mg/dL (ref >39)
LDL Calculated: 59 mg/dL (ref 0–99)
Triglycerides: 60 mg/dL (ref 0–149)
VLDL CHOLESTEROL CAL: 12 mg/dL (ref 5–40)

## 2018-03-11 LAB — CBC
HEMATOCRIT: 35.8 % (ref 34.0–46.6)
HEMOGLOBIN: 12.5 g/dL (ref 11.1–15.9)
MCH: 31.2 pg (ref 26.6–33.0)
MCHC: 34.9 g/dL (ref 31.5–35.7)
MCV: 89 fL (ref 79–97)
Platelets: 278 10*3/uL (ref 150–450)
RBC: 4.01 x10E6/uL (ref 3.77–5.28)
RDW: 12.3 % (ref 11.7–15.4)
WBC: 8.2 10*3/uL (ref 3.4–10.8)

## 2018-03-11 LAB — TSH: TSH: 1.03 u[IU]/mL (ref 0.450–4.500)

## 2018-03-11 LAB — SEDIMENTATION RATE: SED RATE: 52 mm/h — AB (ref 0–40)

## 2018-03-18 ENCOUNTER — Ambulatory Visit: Payer: PPO

## 2018-03-18 ENCOUNTER — Ambulatory Visit
Admission: RE | Admit: 2018-03-18 | Discharge: 2018-03-18 | Disposition: A | Discharge status: Home / Self Care | Payer: PPO | Source: Ambulatory Visit | Attending: Family Medicine | Admitting: Family Medicine

## 2018-03-18 DIAGNOSIS — R928 Other abnormal and inconclusive findings on diagnostic imaging of breast: Secondary | ICD-10-CM | POA: Diagnosis not present

## 2018-03-18 DIAGNOSIS — N6310 Unspecified lump in the right breast, unspecified quadrant: Secondary | ICD-10-CM

## 2018-03-19 ENCOUNTER — Other Ambulatory Visit: Payer: Self-pay | Admitting: Physician Assistant

## 2018-03-19 DIAGNOSIS — I10 Essential (primary) hypertension: Secondary | ICD-10-CM

## 2018-04-04 DIAGNOSIS — M961 Postlaminectomy syndrome, not elsewhere classified: Secondary | ICD-10-CM | POA: Diagnosis not present

## 2018-05-09 DIAGNOSIS — M961 Postlaminectomy syndrome, not elsewhere classified: Secondary | ICD-10-CM | POA: Diagnosis not present

## 2018-05-09 DIAGNOSIS — M5136 Other intervertebral disc degeneration, lumbar region: Secondary | ICD-10-CM | POA: Diagnosis not present

## 2018-05-16 ENCOUNTER — Other Ambulatory Visit: Payer: Self-pay

## 2018-05-16 ENCOUNTER — Ambulatory Visit (INDEPENDENT_AMBULATORY_CARE_PROVIDER_SITE_OTHER): Payer: PPO | Admitting: Family Medicine

## 2018-05-16 VITALS — Ht 61.0 in | Wt 161.0 lb

## 2018-05-16 DIAGNOSIS — Z0000 Encounter for general adult medical examination without abnormal findings: Secondary | ICD-10-CM | POA: Diagnosis not present

## 2018-05-16 NOTE — Progress Notes (Signed)
Presents today for Medicare Annual Wellness Visit   Patient Care Team: Wendie Agreste, MD as PCP - General (Family Medicine) Webb Laws, [place] as Referring Physician (Optometry) Chyrl Civatte Arletta Bale as Physician Assistant (Neurosurgery)   Immunization status:  Immunization History  Administered Date(s) Administered  . Influenza Split 11/10/2011, 10/25/2014  . Influenza,inj,Quad PF,6+ Mos 12/06/2012, 11/10/2014, 10/15/2016  . Influenza-Unspecified 10/10/2013, 11/05/2015, 10/19/2017  . Pneumococcal Conjugate-13 01/18/2015, 10/19/2017  . Pneumococcal-Unspecified 12/15/2004  . Tdap 09/02/2006, 11/05/2015  . Zoster 11/25/2012  . Zoster Recombinat (Shingrix) 08/06/2016, 10/17/2016     There are no preventive care reminders to display for this patient.   Functional Status Survey: Is the patient deaf or have difficulty hearing?: No Does the patient have difficulty seeing, even when wearing glasses/contacts?: No Does the patient have difficulty concentrating, remembering, or making decisions?: No Does the patient have difficulty walking or climbing stairs?: No Does the patient have difficulty dressing or bathing?: No Does the patient have difficulty doing errands alone such as visiting a doctor's office or shopping?: No   6CIT Screen 05/16/2018 10/15/2016  What Year? 0 points 0 points  What month? 0 points 0 points  What time? 0 points 0 points  Count back from 20 0 points 0 points  Months in reverse 0 points 0 points  Repeat phrase 0 points 0 points  Total Score 0 0        Clinical Support from 05/16/2018 in [facility] at [facility]  AUDIT-C Score  0        Patient Active Problem List   Diagnosis Date Noted  . Trochanteric bursitis, left hip 07/02/2016  . Essential hypertension 03/16/2016  . Hyperlipidemia 03/16/2016  . SBO (small bowel obstruction) ([place]) 01/30/2013  . Tobacco abuse 01/30/2013  . Degenerative arthritis of left hip 01/24/2013   . Status post THR (total hip replacement) 01/24/2013     Past Medical History:  Diagnosis Date  . Chronic bronchitis ([place])    "when I get a cold I normally get bronchitis" (01/30/2013)  . Hepatitis C   . Hyperlipidemia   . Hypertension      Past Surgical History:  Procedure Laterality Date  . ABDOMINAL HYSTERECTOMY  1990's  . BACK SURGERY     x5  . CHOLECYSTECTOMY  1990's  . COLONOSCOPY    . JOINT REPLACEMENT Left 01/24/2013   hip  . LUMBAR SPINE SURGERY  2010; 2011   "tightened up some screws" (01/30/2013)  . POSTERIOR LUMBAR FUSION  2006 X 2; 2008  . TOTAL HIP ARTHROPLASTY Left 01/24/2013   Procedure: LEFT TOTAL HIP ARTHROPLASTY ANTERIOR APPROACH;  Surgeon: Mcarthur Rossetti, MD;  Location: [facility];  Service: Orthopedics;  Laterality: Left;  . TUBAL LIGATION  1970's     Family History  Problem Relation Age of Onset  . Throat cancer Mother   . Breast cancer Sister   . Prostate cancer Brother      Social History   Socioeconomic History  . Marital status: Married    Spouse name: Not on file  . Number of children: Not on file  . Years of education: Not on file  . Highest education level: Not on file  Occupational History  . Not on file  Social Needs  . Financial resource strain: Not on file  . Food insecurity:    Worry: Not on file    Inability: Not on file  . Transportation needs:    Medical: Not on file  Non-medical: Not on file  Tobacco Use  . Smoking status: Former Smoker    Packs/day: 1.00    Years: 45.00    Pack years: 45.00    Types: Cigarettes    Last attempt to quit: 05/25/2015    Years since quitting: 2.9  . Smokeless tobacco: Never Used  Substance and Sexual Activity  . Alcohol use: No    Alcohol/week: 0.0 standard drinks    Comment: 01/30/2013 "used to drink beer; none since ~ 1989"  . Drug use: No  . Sexual activity: Yes    Birth control/protection: Post-menopausal  Lifestyle  . Physical activity:    Days per week: Not on  file    Minutes per session: Not on file  . Stress: Not on file  Relationships  . Social connections:    Talks on phone: Not on file    Gets together: Not on file    Attends religious service: Not on file    Active member of club or organization: Not on file    Attends meetings of clubs or organizations: Not on file    Relationship status: Not on file  . Intimate partner violence:    Fear of current or ex partner: Not on file    Emotionally abused: Not on file    Physically abused: Not on file    Forced sexual activity: Not on file  Other Topics Concern  . Not on file  Social History Narrative  . Not on file     No Known Allergies   Prior to Admission medications   Medication Sig Start Date End Date Taking? Authorizing Provider  aspirin 81 MG tablet Take 81 mg by mouth daily.   Yes [provider]  hydrochlorothiazide (HYDRODIURIL) 25 MG tablet TAKE 1 TABLET(25 MG) BY MOUTH DAILY 03/10/18  Yes Wendie Agreste, MD  HYDROcodone-acetaminophen (NORCO) 10-325 MG tablet Take 1 tablet by mouth every 8 (eight) hours as needed (pain).    Yes [provider]  methocarbamol (ROBAXIN) 500 MG tablet Take 500 mg by mouth 4 (four) times daily.   Yes [provider]  Misc. Devices [facility]) MISC 1 Device by Does not apply route as needed. Needed for low back pain, history of lumbar surgery, rollator insufficient for walking due to bursitis and back pain. 08/27/16  Yes Wendie Agreste, MD  Olopatadine HCl (PAZEO) 0.7 % SOLN Place 1 drop into both eyes daily.   Yes [provider]  simvastatin (ZOCOR) 40 MG tablet Take 1 tablet (40 mg total) by mouth daily. 03/10/18  Yes Wendie Agreste, MD  NAPROXEN 375 MG TBEC EC tablet TAKE 1 TABLET BY MOUTH 2 TIMES DAILY. TAKE 1000MG  TYLENOL EVERY 8 HOURS WITH THIS AND YOUR CHRONIC PAIN MEDS 09/21/17   Wendie Agreste, MD     Depression screen [facility] 2/9 03/10/2018 09/06/2017 08/25/2017 10/20/2016 10/15/2016  Decreased  Interest 0 0 0 0 0  Down, Depressed, Hopeless 0 0 - 0 0  PHQ - 2 Score 0 0 0 0 0     Fall Risk  05/16/2018 03/10/2018 09/06/2017 08/25/2017 10/20/2016  Falls in the past year? 0 0 No No No  Number falls in past yr: 0 0 - - -  Injury with Fall? 0 0 - - -  Follow up - Falls evaluation completed - - -      PHYSICAL EXAM: Ht 5\' 1"  (1.549 m)   Wt 161 lb (73 kg)   BMI 30.42 kg/m  Wt Readings from Last 3 Encounters:  05/16/18 161 lb (73 kg)  03/10/18 165 lb (74.8 kg)  09/06/17 161 lb 12.8 oz (73.4 kg)     No exam data present    Physical Exam   Education/Counseling provided regarding diet and exercise, prevention of chronic diseases, smoking/tobacco cessation, if applicable, and reviewed "Covered Medicare Preventive Services."   ASSESSMENT/PLAN: 1. Medicare annual wellness visit, subsequent       Cardiac Risk Factors include: advanced age (>35men, >75 women);hypertension;dyslipidemia   Hearing/Vision screen Vision Screening Comments: Patient sees Dr. Einar Gip for her regular eye exams. Patient will be calling this month to schedule appointment.   Screening recommendations/referrals: Colonoscopy: up to date, next due 07/01/2025 Mammogram: up to date, next due 07/02/2018 Bone Density: due, scheduled today Recommended yearly ophthalmology/optometry visit for glaucoma screening and checkup Recommended yearly dental visit for hygiene and checkup  Advanced directives: Advance directive discussed with you today. Even though you declined this today please call our office should you change your mind and we can give you the proper paperwork for you to fill out. .  Lives in one story home husband Has grab throughout house No scattered rugs Well lit  Exercise at home twice a week. Jumping jacks walking with kids

## 2018-05-16 NOTE — Patient Instructions (Signed)
Thank you for taking time to come for your Medicare Wellness Visit. I appreciate your ongoing commitment to your health goals. Please review the following plan we discussed and let me know if I can assist you in the future.  Leroy Kennedy LPN    Fall Prevention in the Home, Adult Falls can cause injuries. They can happen to people of all ages. There are many things you can do to make your home safe and to help prevent falls. Ask for help when making these changes, if needed. What actions can I take to prevent falls? General Instructions  Use good lighting in all rooms. Replace any light bulbs that burn out.  Turn on the lights when you go into a dark area. Use night-lights.  Keep items that you use often in easy-to-reach places. Lower the shelves around your home if necessary.  Set up your furniture so you have a clear path. Avoid moving your furniture around.  Do not have throw rugs and other things on the floor that can make you trip.  Avoid walking on wet floors.  If any of your floors are uneven, fix them.  Add color or contrast paint or tape to clearly mark and help you see: ? Any grab bars or handrails. ? First and last steps of stairways. ? Where the edge of each step is.  If you use a stepladder: ? Make sure that it is fully opened. Do not climb a closed stepladder. ? Make sure that both sides of the stepladder are locked into place. ? Ask someone to hold the stepladder for you while you use it.  If there are any pets around you, be aware of where they are. What can I do in the bathroom?      Keep the floor dry. Clean up any water that spills onto the floor as soon as it happens.  Remove soap buildup in the tub or shower regularly.  Use non-skid mats or decals on the floor of the tub or shower.  Attach bath mats securely with double-sided, non-slip rug tape.  If you need to sit down in the shower, use a plastic, non-slip stool.  Install grab bars by the toilet  and in the tub and shower. Do not use towel bars as grab bars. What can I do in the bedroom?  Make sure that you have a light by your bed that is easy to reach.  Do not use any sheets or blankets that are too big for your bed. They should not hang down onto the floor.  Have a firm chair that has side arms. You can use this for support while you get dressed. What can I do in the kitchen?  Clean up any spills right away.  If you need to reach something above you, use a strong step stool that has a grab bar.  Keep electrical cords out of the way.  Do not use floor polish or wax that makes floors slippery. If you must use wax, use non-skid floor wax. What can I do with my stairs?  Do not leave any items on the stairs.  Make sure that you have a light switch at the top of the stairs and the bottom of the stairs. If you do not have them, ask someone to add them for you.  Make sure that there are handrails on both sides of the stairs, and use them. Fix handrails that are broken or loose. Make sure that handrails are as long  as the stairways.  Install non-slip stair treads on all stairs in your home.  Avoid having throw rugs at the top or bottom of the stairs. If you do have throw rugs, attach them to the floor with carpet tape.  Choose a carpet that does not hide the edge of the steps on the stairway.  Check any carpeting to make sure that it is firmly attached to the stairs. Fix any carpet that is loose or worn. What can I do on the outside of my home?  Use bright outdoor lighting.  Regularly fix the edges of walkways and driveways and fix any cracks.  Remove anything that might make you trip as you walk through a door, such as a raised step or threshold.  Trim any bushes or trees on the path to your home.  Regularly check to see if handrails are loose or broken. Make sure that both sides of any steps have handrails.  Install guardrails along the edges of any raised decks and  porches.  Clear walking paths of anything that might make someone trip, such as tools or rocks.  Have any leaves, snow, or ice cleared regularly.  Use sand or salt on walking paths during winter.  Clean up any spills in your garage right away. This includes grease or oil spills. What other actions can I take?  Wear shoes that: ? Have a low heel. Do not wear high heels. ? Have rubber bottoms. ? Are comfortable and fit you well. ? Are closed at the toe. Do not wear open-toe sandals.  Use tools that help you move around (mobility aids) if they are needed. These include: ? Canes. ? Walkers. ? Scooters. ? Crutches.  Review your medicines with your doctor. Some medicines can make you feel dizzy. This can increase your chance of falling. Ask your doctor what other things you can do to help prevent falls. Where to find more information  Centers for Disease Control and Prevention, STEADI: https://garcia.biz/  [employer] on Aging: BrainJudge.co.uk Contact a doctor if:  You are afraid of falling at home.  You feel weak, drowsy, or dizzy at home.  You fall at home. Summary  There are many simple things that you can do to make your home safe and to help prevent falls.  Ways to make your home safe include removing tripping hazards and installing grab bars in the bathroom.  Ask for help when making these changes in your home. This information is not intended to replace advice given to you by your health care provider. Make sure you discuss any questions you have with your health care provider. Document Released: 11/22/2008 Document Revised: 09/10/2016 Document Reviewed: 09/10/2016 Elsevier Interactive Patient Education  2019 [employer] Following a healthy eating pattern may help you to achieve and maintain a healthy body weight, reduce the risk of chronic disease, and live a long and productive life. It is important to follow a healthy eating pattern  at an appropriate calorie level for your body. Your nutritional needs should be met primarily through food by choosing a variety of nutrient-rich foods. What are tips for following this plan? Reading food labels  Read labels and choose the following: ? Reduced or low sodium. ? Juices with 100% fruit juice. ? Foods with low saturated fats and high polyunsaturated and monounsaturated fats. ? Foods with whole grains, such as whole wheat, cracked wheat, brown rice, and wild rice. ? Whole grains that are fortified with folic acid. This is  recommended for women who are pregnant or who want to become pregnant.  Read labels and avoid the following: ? Foods with a lot of added sugars. These include foods that contain brown sugar, corn sweetener, corn syrup, dextrose, fructose, glucose, high-fructose corn syrup, honey, invert sugar, lactose, malt syrup, maltose, molasses, raw sugar, sucrose, trehalose, or turbinado sugar.  Do not eat more than the following amounts of added sugar per day:  6 teaspoons (25 g) for women.  9 teaspoons (38 g) for men. ? Foods that contain processed or refined starches and grains. ? Refined grain products, such as white flour, degermed cornmeal, white bread, and white rice. Shopping  Choose nutrient-rich snacks, such as vegetables, whole fruits, and nuts. Avoid high-calorie and high-sugar snacks, such as potato chips, fruit snacks, and candy.  Use oil-based dressings and spreads on foods instead of solid fats such as butter, stick margarine, or cream cheese.  Limit pre-made sauces, mixes, and "instant" products such as flavored rice, instant noodles, and ready-made pasta.  Try more plant-protein sources, such as tofu, tempeh, black beans, edamame, lentils, nuts, and seeds.  Explore eating plans such as the Mediterranean diet or vegetarian diet. Cooking  Use oil to saut or stir-fry foods instead of solid fats such as butter, stick margarine, or lard.  Try  baking, boiling, grilling, or broiling instead of frying.  Remove the fatty part of meats before cooking.  Steam vegetables in water or broth. Meal planning   At meals, imagine dividing your plate into fourths: ? One-half of your plate is fruits and vegetables. ? One-fourth of your plate is whole grains. ? One-fourth of your plate is protein, especially lean meats, poultry, eggs, tofu, beans, or nuts.  Include low-fat dairy as part of your daily diet. Lifestyle  Choose healthy options in all settings, including home, work, school, restaurants, or stores.  Prepare your food safely: ? Wash your hands after handling raw meats. ? Keep food preparation surfaces clean by regularly washing with hot, soapy water. ? Keep raw meats separate from ready-to-eat foods, such as fruits and vegetables. ? Cook seafood, meat, poultry, and eggs to the recommended internal temperature. ? Store foods at safe temperatures. In general:  Keep cold foods at 108F (4.4C) or below.  Keep hot foods at 1108F (60C) or above.  Keep your freezer at [facility] (-17.8C) or below.  Foods are no longer safe to eat when they have been between the temperatures of 40-1108F (4.4-60C) for more than 2 hours. What foods should I eat? Fruits Aim to eat 2 cup-equivalents of fresh, canned (in natural juice), or frozen fruits each day. Examples of 1 cup-equivalent of fruit include 1 small apple, 8 large strawberries, 1 cup canned fruit,  cup dried fruit, or 1 cup 100% juice. Vegetables Aim to eat 2-3 cup-equivalents of fresh and frozen vegetables each day, including different varieties and colors. Examples of 1 cup-equivalent of vegetables include 2 medium carrots, 2 cups raw, leafy greens, 1 cup chopped vegetable (raw or cooked), or 1 medium baked potato. Grains Aim to eat 6 ounce-equivalents of whole grains each day. Examples of 1 ounce-equivalent of grains include 1 slice of bread, 1 cup ready-to-eat cereal, 3 cups popcorn,  or  cup cooked rice, pasta, or cereal. Meats and other proteins Aim to eat 5-6 ounce-equivalents of protein each day. Examples of 1 ounce-equivalent of protein include 1 egg, 1/2 cup nuts or seeds, or 1 tablespoon (16 g) peanut butter. A cut of meat or fish that  is the size of a deck of cards is about 3-4 ounce-equivalents.  Of the protein you eat each week, try to have at least 8 ounces come from seafood. This includes salmon, trout, herring, and anchovies. Dairy Aim to eat 3 cup-equivalents of fat-free or low-fat dairy each day. Examples of 1 cup-equivalent of dairy include 1 cup (240 mL) milk, 8 ounces (250 g) yogurt, 1 ounces (44 g) natural cheese, or 1 cup (240 mL) fortified soy milk. Fats and oils  Aim for about 5 teaspoons (21 g) per day. Choose monounsaturated fats, such as canola and olive oils, avocados, peanut butter, and most nuts, or polyunsaturated fats, such as sunflower, corn, and soybean oils, walnuts, pine nuts, sesame seeds, sunflower seeds, and flaxseed. Beverages  Aim for six 8-oz glasses of water per day. Limit coffee to three to five 8-oz cups per day.  Limit caffeinated beverages that have added calories, such as soda and energy drinks.  Limit alcohol intake to no more than 1 drink a day for nonpregnant women and 2 drinks a day for men. One drink equals 12 oz of beer (355 mL), 5 oz of wine (148 mL), or 1 oz of hard liquor (44 mL). Seasoning and other foods  Avoid adding excess amounts of salt to your foods. Try flavoring foods with herbs and spices instead of salt.  Avoid adding sugar to foods.  Try using oil-based dressings, sauces, and spreads instead of solid fats. This information is based on general [place] nutrition guidelines. For more information, visit BuildDNA.es. Exact amounts may vary based on your nutrition needs. Summary  A healthy eating plan may help you to maintain a healthy weight, reduce the risk of chronic diseases, and stay active  throughout your life.  Plan your meals. Make sure you eat the right portions of a variety of nutrient-rich foods.  Try baking, boiling, grilling, or broiling instead of frying.  Choose healthy options in all settings, including home, work, school, restaurants, or stores. This information is not intended to replace advice given to you by your health care provider. Make sure you discuss any questions you have with your health care provider. Document Released: 05/10/2017 Document Revised: 05/10/2017 Document Reviewed: 05/10/2017 Elsevier Interactive Patient Education  2019 [employer].

## 2018-07-11 DIAGNOSIS — M5136 Other intervertebral disc degeneration, lumbar region: Secondary | ICD-10-CM | POA: Diagnosis not present

## 2018-08-11 ENCOUNTER — Other Ambulatory Visit: Payer: Self-pay | Admitting: Family Medicine

## 2018-08-11 DIAGNOSIS — Z1231 Encounter for screening mammogram for malignant neoplasm of breast: Secondary | ICD-10-CM

## 2018-08-15 DIAGNOSIS — M961 Postlaminectomy syndrome, not elsewhere classified: Secondary | ICD-10-CM | POA: Diagnosis not present

## 2018-08-15 DIAGNOSIS — M5136 Other intervertebral disc degeneration, lumbar region: Secondary | ICD-10-CM | POA: Diagnosis not present

## 2018-09-04 ENCOUNTER — Other Ambulatory Visit: Payer: Self-pay | Admitting: Family Medicine

## 2018-09-04 DIAGNOSIS — E785 Hyperlipidemia, unspecified: Secondary | ICD-10-CM

## 2018-09-04 NOTE — Telephone Encounter (Signed)
Requested Prescriptions  Pending Prescriptions Disp Refills  . simvastatin (ZOCOR) 40 MG tablet [Pharmacy Med Name: SIMVASTATIN 40MG  TABLETS] 90 tablet 1    Sig: TAKE 1 TABLET(40 MG) BY MOUTH DAILY     Cardiovascular:  Antilipid - Statins Passed - 09/04/2018 10:22 AM      Passed - Total Cholesterol in normal range and within 360 days    Cholesterol, Total  Date Value Ref Range Status  03/10/2018 125 100 - 199 mg/dL Final         Passed - LDL in normal range and within 360 days    LDL Calculated  Date Value Ref Range Status  03/10/2018 59 0 - 99 mg/dL Final         Passed - HDL in normal range and within 360 days    HDL  Date Value Ref Range Status  03/10/2018 54 >39 mg/dL Final         Passed - Triglycerides in normal range and within 360 days    Triglycerides  Date Value Ref Range Status  03/10/2018 60 0 - 149 mg/dL Final         Passed - Patient is not pregnant      Passed - Valid encounter within last 12 months    Recent Outpatient Visits          3 months ago Medicare annual wellness visit, subsequent   [facility], Arlie Solomons, MD   5 months ago Essential hypertension   [facility], MD   12 months ago Essential hypertension   [facility], Ranell Patrick, MD   1 year ago Right hip pain   [facility], Audrie Lia, PA-C   1 year ago Cervical radiculopathy   [facility], Ranell Patrick, MD

## 2018-09-05 ENCOUNTER — Other Ambulatory Visit: Payer: Self-pay | Admitting: Family Medicine

## 2018-09-05 DIAGNOSIS — I10 Essential (primary) hypertension: Secondary | ICD-10-CM

## 2018-09-27 DIAGNOSIS — H524 Presbyopia: Secondary | ICD-10-CM | POA: Diagnosis not present

## 2018-09-27 DIAGNOSIS — H52223 Regular astigmatism, bilateral: Secondary | ICD-10-CM | POA: Diagnosis not present

## 2018-09-27 DIAGNOSIS — H25099 Other age-related incipient cataract, unspecified eye: Secondary | ICD-10-CM | POA: Diagnosis not present

## 2018-09-27 DIAGNOSIS — H11159 Pinguecula, unspecified eye: Secondary | ICD-10-CM | POA: Diagnosis not present

## 2018-09-27 DIAGNOSIS — H5213 Myopia, bilateral: Secondary | ICD-10-CM | POA: Diagnosis not present

## 2018-09-28 ENCOUNTER — Ambulatory Visit
Admission: RE | Admit: 2018-09-28 | Discharge: 2018-09-28 | Disposition: A | Discharge status: Home / Self Care | Payer: PPO | Source: Ambulatory Visit | Attending: Family Medicine | Admitting: Family Medicine

## 2018-09-28 ENCOUNTER — Other Ambulatory Visit: Payer: Self-pay

## 2018-09-28 DIAGNOSIS — Z1231 Encounter for screening mammogram for malignant neoplasm of breast: Secondary | ICD-10-CM | POA: Diagnosis not present

## 2018-09-29 ENCOUNTER — Telehealth: Payer: Self-pay | Admitting: Family Medicine

## 2018-09-29 NOTE — Telephone Encounter (Signed)
Pt dropped off parking Placard paperwork to be filled out by Dr. Carlota Raspberry for [facility] parking placard . Please call pt when ready to be picked up/ paperwork placed in Provider box at CNA  station  FR

## 2018-10-03 ENCOUNTER — Telehealth: Payer: Self-pay | Admitting: Emergency Medicine

## 2018-10-03 NOTE — Telephone Encounter (Signed)
Patient was informed her parking form is ready for pick up

## 2018-10-03 NOTE — Telephone Encounter (Signed)
This was given to my assistant this morning.

## 2018-10-03 NOTE — Telephone Encounter (Signed)
Dr Carlota Raspberry do you have this form?  I checked your box in physician's office and at the clinical nurses station and didn't see the form in either place.  Please advise. Dgaddy, CMA

## 2018-10-24 DIAGNOSIS — M5136 Other intervertebral disc degeneration, lumbar region: Secondary | ICD-10-CM | POA: Diagnosis not present

## 2018-11-14 DIAGNOSIS — I10 Essential (primary) hypertension: Secondary | ICD-10-CM | POA: Diagnosis not present

## 2018-11-14 DIAGNOSIS — Z6827 Body mass index (BMI) 27.0-27.9, adult: Secondary | ICD-10-CM | POA: Diagnosis not present

## 2018-11-14 DIAGNOSIS — M5136 Other intervertebral disc degeneration, lumbar region: Secondary | ICD-10-CM | POA: Diagnosis not present

## 2018-11-14 DIAGNOSIS — M961 Postlaminectomy syndrome, not elsewhere classified: Secondary | ICD-10-CM | POA: Diagnosis not present

## 2019-01-23 DIAGNOSIS — M961 Postlaminectomy syndrome, not elsewhere classified: Secondary | ICD-10-CM | POA: Diagnosis not present

## 2019-02-22 DIAGNOSIS — M62838 Other muscle spasm: Secondary | ICD-10-CM | POA: Diagnosis not present

## 2019-02-22 DIAGNOSIS — M5137 Other intervertebral disc degeneration, lumbosacral region: Secondary | ICD-10-CM | POA: Diagnosis not present

## 2019-02-22 DIAGNOSIS — M546 Pain in thoracic spine: Secondary | ICD-10-CM | POA: Diagnosis not present

## 2019-02-27 ENCOUNTER — Other Ambulatory Visit: Payer: Self-pay | Admitting: Family Medicine

## 2019-02-27 DIAGNOSIS — E785 Hyperlipidemia, unspecified: Secondary | ICD-10-CM

## 2019-03-22 ENCOUNTER — Telehealth (INDEPENDENT_AMBULATORY_CARE_PROVIDER_SITE_OTHER): Payer: PPO | Admitting: Family Medicine

## 2019-03-22 ENCOUNTER — Other Ambulatory Visit: Payer: Self-pay

## 2019-03-22 ENCOUNTER — Telehealth: Payer: Self-pay | Admitting: Family Medicine

## 2019-03-22 VITALS — Temp 99.0°F | Ht 65.0 in | Wt 160.0 lb

## 2019-03-22 DIAGNOSIS — R519 Headache, unspecified: Secondary | ICD-10-CM

## 2019-03-22 DIAGNOSIS — R509 Fever, unspecified: Secondary | ICD-10-CM

## 2019-03-22 NOTE — Patient Instructions (Addendum)
For current headache - rest, drink plenty of fluids, tylenol as needed.  As we discussed with the sweats, possible fever, COVID-19 infection is possible or early infection.  For now I do recommend isolating yourself at home, minimize contact with others until we follow-up and discuss testing.  If any shortness of breath, acute worsening of headache, weakness, slurred speech, confusion, or other acute worsening of symptoms, be seen in the emergency room or urgent care.  I will follow up with you in 2 days and we can discuss testing further at that time  If you have lab work done today you will be contacted with your lab results within the next 2 weeks.  If you have not heard from [place] then please contact us. The fastest way to get your results is to register for My Chart.   IF you received an x-ray today, you will receive an invoice from [facility]. Please contact [facility] at [phone] with questions or concerns regarding your invoice.   IF you received labwork today, you will receive an invoice from [facility]. Please contact [facility] at [phone] with questions or concerns regarding your invoice.   Our billing staff will not be able to assist you with questions regarding bills from these companies.  You will be contacted with the lab results as soon as they are available. The fastest way to get your results is to activate your My Chart account. Instructions are located on the last page of this paperwork. If you have not heard from [place] regarding the results in 2 weeks, please contact this office.

## 2019-03-22 NOTE — Telephone Encounter (Addendum)
03/22/2019 - PATIENT HAD A DOXIMITY VIDEO CALL WITH DR. Carlota Raspberry ON Wednesday (03/22/2019) FOR HEADACHES. DR. Carlota Raspberry HAS REQUESTED SHE HAVE ANOTHER DOXIMITY VIDEO CALL ON Friday (03/24/2019). I TRIED TO CALL AND SCHEDULE BUT HAD TO LEAVE A MESSAGE ON HER VOICE MAIL TO RETURN MY CALL SO THAT WE COULD GET THAT LAST VIRTUAL APPOINTMENT AT 4:40pm. [place]

## 2019-03-22 NOTE — Progress Notes (Signed)
Virtual Visit via Video Note  I connected with [name] on 03/22/19 at 2:26 PM by a video enabled telemedicine application Doximity and verified that I am speaking with the correct person using two identifiers.   I discussed the limitations, risks, security and privacy concerns of performing an evaluation and management service by telephone and the availability of in person appointments. I also discussed with the patient that there may be a patient responsible charge related to this service. The patient expressed understanding and agreed to proceed, consent obtained  Chief complaint:  Chief Complaint  Patient presents with  . Headache    Headaches been going on for a while now it usually go away but this time been hanging around. Took 500 mg Tylenol which is helping a little. I think it may be due to my bp meds    History of Present Illness: [name] is a 69 y.o. female  Headache: Has had some morning headaches in the past - wakes up with HA, but usually goes away as day goes on - ever since taking blood pressure meds since last summer.  Usually resolve in few mins.   Current HA started last night - woke her up out of sleep in the middle of the night, bitemporal location.  No n/v/slurred speech/facial droop. No darkening of vision. Generalized fatigue, faint feeling Saturday temporarliy - not since. No vision change with HA, no amaurosis fugax.  Not worst headache of life. Migraines when younger, none recently. Felt warm last night. Felt subjectively febrile/warm -  T99.6 this am(had take tylenol), sweats after fever broke this am.  No cough/change in taste or smell. No diarrhea/GI distress.  No known sick contacts/covid contacts/recent travel.  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  Taking hctz daily  - not checking home readings.    Tested 3 weeks for covid - negative, no vaccine yet.   Tx: tylenol this am and then 6 hrs later - helped HA a lot, not yet resolved.  Feels better now.        Patient Active Problem List   Diagnosis Date Noted  . Trochanteric bursitis, left hip 07/02/2016  . Essential hypertension 03/16/2016  . Hyperlipidemia 03/16/2016  . SBO (small bowel obstruction) ([place]) 01/30/2013  . Tobacco abuse 01/30/2013  . Degenerative arthritis of left hip 01/24/2013  . Status post THR (total hip replacement) 01/24/2013   Past Medical History:  Diagnosis Date  . Chronic bronchitis ([place])    "when I get a cold I normally get bronchitis" (01/30/2013)  . Hepatitis C   . Hyperlipidemia   . Hypertension    Past Surgical History:  Procedure Laterality Date  . ABDOMINAL HYSTERECTOMY  1990's  . BACK SURGERY     x5  . CHOLECYSTECTOMY  1990's  . COLONOSCOPY    . JOINT REPLACEMENT Left 01/24/2013   hip  . LUMBAR SPINE SURGERY  2010; 2011   "tightened up some screws" (01/30/2013)  . POSTERIOR LUMBAR FUSION  2006 X 2; 2008  . TOTAL HIP ARTHROPLASTY Left 01/24/2013   Procedure: LEFT TOTAL HIP ARTHROPLASTY ANTERIOR APPROACH;  Surgeon: Mcarthur Rossetti, MD;  Location: [facility];  Service: Orthopedics;  Laterality: Left;  . TUBAL LIGATION  1970's   No Known Allergies Prior to Admission medications   Medication Sig Start Date End Date Taking? Authorizing Provider  aspirin 81 MG tablet Take 81 mg by mouth daily.   Yes [provider]  hydrochlorothiazide (HYDRODIURIL) 25 MG tablet  TAKE 1 TABLET(25 MG) BY MOUTH DAILY 09/05/18  Yes Wendie Agreste, MD  HYDROcodone-acetaminophen [facility]) 10-325 MG tablet Take 1 tablet by mouth every 8 (eight) hours as needed (pain).    Yes [provider]  methocarbamol (ROBAXIN) 500 MG tablet Take 500 mg by mouth 4 (four) times daily.   Yes [provider]  Misc. Devices [facility]) MISC 1 Device by Does not apply route as needed. Needed for low back pain, history of lumbar surgery, rollator insufficient for walking due to bursitis and back pain. 08/27/16  Yes Wendie Agreste, MD   NAPROXEN 375 MG TBEC EC tablet TAKE 1 TABLET BY MOUTH 2 TIMES DAILY. TAKE 1000MG  TYLENOL EVERY 8 HOURS WITH THIS AND YOUR CHRONIC PAIN MEDS 09/21/17  Yes Wendie Agreste, MD  Olopatadine HCl (PAZEO) 0.7 % SOLN Place 1 drop into both eyes daily.   Yes [provider]  simvastatin (ZOCOR) 40 MG tablet TAKE 1 TABLET(40 MG) BY MOUTH DAILY 02/27/19  Yes Wendie Agreste, MD   Social History   Socioeconomic History  . Marital status: Married    Spouse name: Not on file  . Number of children: Not on file  . Years of education: Not on file  . Highest education level: Not on file  Occupational History  . Not on file  Tobacco Use  . Smoking status: Former Smoker    Packs/day: 1.00    Years: 45.00    Pack years: 45.00    Types: Cigarettes    Quit date: 05/25/2015    Years since quitting: 3.8  . Smokeless tobacco: Never Used  Substance and Sexual Activity  . Alcohol use: No    Alcohol/week: 0.0 standard drinks    Comment: 01/30/2013 "used to drink beer; none since ~ 1989"  . Drug use: No  . Sexual activity: Yes    Birth control/protection: Post-menopausal  Other Topics Concern  . Not on file  Social History Narrative  . Not on file   Social Determinants of Health   Financial Resource Strain:   . Difficulty of Paying Living Expenses: Not on file  Food Insecurity:   . Worried About Charity fundraiser in the Last Year: Not on file  . Ran Out of Food in the Last Year: Not on file  Transportation Needs:   . Lack of Transportation (Medical): Not on file  . Lack of Transportation (Non-Medical): Not on file  Physical Activity:   . Days of Exercise per Week: Not on file  . Minutes of Exercise per Session: Not on file  Stress:   . Feeling of Stress : Not on file  Social Connections:   . Frequency of Communication with Friends and Family: Not on file  . Frequency of Social Gatherings with Friends and Family: Not on file  . Attends Religious Services: Not on file  . Active  Member of Clubs or Organizations: Not on file  . Attends Archivist Meetings: Not on file  . Marital Status: Not on file  Intimate Partner Violence:   . Fear of Current or Ex-Partner: Not on file  . Emotionally Abused: Not on file  . Physically Abused: Not on file  . Sexually Abused: Not on file    Observations/Objective: Vitals:   03/22/19 1033  Temp: 99 F (37.2 C)  TempSrc: Oral  Weight: 160 lb (72.6 kg)  Height: 5\' 5"  (1.651 m)  BP 117/75, HR 70 - on phone  Speaking normally, normal facial movements,  no droop.  Appropriate responses, nontoxic, no respiratory distress.  Localizes previous headache to both temples.  Assessment and Plan: Temporal headache  Subjective fever New headache last night, not worst headache of life, improved with Tylenol, does report some subjective fevers and sweats, differential includes infectious cause including COVID-19.  Recommended home isolation for now, Tylenol for symptomatic care if headache is as improved, fluids, rest, recheck in 48 hours by video visit to discuss testing.  Deferred at this time as symptoms just started last night.  ER precautions given.   Follow Up Instructions: 2 days - video.    I discussed the assessment and treatment plan with the patient. The patient was provided an opportunity to ask questions and all were answered. The patient agreed with the plan and demonstrated an understanding of the instructions.   The patient was advised to call back or seek an in-person evaluation if the symptoms worsen or if the condition fails to improve as anticipated.  I provided 20 minutes of non-face-to-face time during this encounter.   Wendie Agreste, MD

## 2019-03-24 ENCOUNTER — Encounter: Payer: PPO | Admitting: Family Medicine

## 2019-03-24 ENCOUNTER — Other Ambulatory Visit: Payer: Self-pay

## 2019-03-26 ENCOUNTER — Other Ambulatory Visit: Payer: Self-pay | Admitting: Family Medicine

## 2019-03-26 DIAGNOSIS — E785 Hyperlipidemia, unspecified: Secondary | ICD-10-CM

## 2019-04-24 DIAGNOSIS — M546 Pain in thoracic spine: Secondary | ICD-10-CM | POA: Diagnosis not present

## 2019-05-21 ENCOUNTER — Other Ambulatory Visit: Payer: Self-pay | Admitting: Family Medicine

## 2019-05-21 DIAGNOSIS — E785 Hyperlipidemia, unspecified: Secondary | ICD-10-CM

## 2019-05-21 NOTE — Telephone Encounter (Signed)
Requested medications are due for refill today?  Yes   Requested medications are on active medication list?  Yes  Last Refill:   03/26/2019    # 30 with 1 refill  Future visit scheduled?  No  Notes to Clinic:    Medication refill request failed RX refill protocol due to lab work being greater than 360 days over due.  Patient's labs were last performed in January 2020.

## 2019-05-22 DIAGNOSIS — M961 Postlaminectomy syndrome, not elsewhere classified: Secondary | ICD-10-CM | POA: Diagnosis not present

## 2019-05-22 DIAGNOSIS — M546 Pain in thoracic spine: Secondary | ICD-10-CM | POA: Diagnosis not present

## 2019-05-25 ENCOUNTER — Other Ambulatory Visit: Payer: Self-pay | Admitting: Family Medicine

## 2019-05-25 DIAGNOSIS — I10 Essential (primary) hypertension: Secondary | ICD-10-CM

## 2019-05-25 NOTE — Telephone Encounter (Signed)
Requested  medications are  due for refill today yes  Requested medications are on the active medication list yes  Last refill 1/18  Future visit scheduled no, had a no show  Notes to clinic failed protocol of visit within 6 months

## 2019-05-31 ENCOUNTER — Encounter: Payer: Self-pay | Admitting: Family Medicine

## 2019-06-20 ENCOUNTER — Ambulatory Visit (INDEPENDENT_AMBULATORY_CARE_PROVIDER_SITE_OTHER): Payer: PPO | Admitting: Family Medicine

## 2019-06-20 VITALS — BP 117/67 | Ht 65.0 in | Wt 160.0 lb

## 2019-06-20 DIAGNOSIS — Z0000 Encounter for general adult medical examination without abnormal findings: Secondary | ICD-10-CM | POA: Diagnosis not present

## 2019-06-20 NOTE — Patient Instructions (Addendum)
Thank you for taking time to come for your Medicare Wellness Visit. I appreciate your ongoing commitment to your health goals. Please review the following plan we discussed and let me know if I can assist you in the future.  Leroy Kennedy LPN  Preventive Care 68 Years and Older, Female Preventive care refers to lifestyle choices and visits with your health care provider that can promote health and wellness. This includes:  A yearly physical exam. This is also called an annual well check.  Regular dental and eye exams.  Immunizations.  Screening for certain conditions.  Healthy lifestyle choices, such as diet and exercise. What can I expect for my preventive care visit? Physical exam Your health care provider will check:  Height and weight. These may be used to calculate body mass index (BMI), which is a measurement that tells if you are at a healthy weight.  Heart rate and blood pressure.  Your skin for abnormal spots. Counseling Your health care provider may ask you questions about:  Alcohol, tobacco, and drug use.  Emotional well-being.  Home and relationship well-being.  Sexual activity.  Eating habits.  History of falls.  Memory and ability to understand (cognition).  Work and work Statistician.  Pregnancy and menstrual history. What immunizations do I need?  Influenza (flu) vaccine  This is recommended every year. Tetanus, diphtheria, and pertussis (Tdap) vaccine  You may need a Td booster every 10 years. Varicella (chickenpox) vaccine  You may need this vaccine if you have not already been vaccinated. Zoster (shingles) vaccine  You may need this after age 94. Pneumococcal conjugate (PCV13) vaccine  One dose is recommended after age 16. Pneumococcal polysaccharide (PPSV23) vaccine  One dose is recommended after age 24. Measles, mumps, and rubella (MMR) vaccine  You may need at least one dose of MMR if you were born in 1957 or later. You may also  need a second dose. Meningococcal conjugate (MenACWY) vaccine  You may need this if you have certain conditions. Hepatitis A vaccine  You may need this if you have certain conditions or if you travel or work in places where you may be exposed to hepatitis A. Hepatitis B vaccine  You may need this if you have certain conditions or if you travel or work in places where you may be exposed to hepatitis B. Haemophilus influenzae type b (Hib) vaccine  You may need this if you have certain conditions. You may receive vaccines as individual doses or as more than one vaccine together in one shot (combination vaccines). Talk with your health care provider about the risks and benefits of combination vaccines. What tests do I need? Blood tests  Lipid and cholesterol levels. These may be checked every 5 years, or more frequently depending on your overall health.  Hepatitis C test.  Hepatitis B test. Screening  Lung cancer screening. You may have this screening every year starting at age 68 if you have a 30-pack-year history of smoking and currently smoke or have quit within the past 15 years.  Colorectal cancer screening. All adults should have this screening starting at age 68 and continuing until age 53. Your health care provider may recommend screening at age 34 if you are at increased risk. You will have tests every 1-10 years, depending on your results and the type of screening test.  Diabetes screening. This is done by checking your blood sugar (glucose) after you have not eaten for a while (fasting). You may have this done every 1-3  years.  Mammogram. This may be done every 1-2 years. Talk with your health care provider about how often you should have regular mammograms.  BRCA-related cancer screening. This may be done if you have a family history of breast, ovarian, tubal, or peritoneal cancers. Other tests  Sexually transmitted disease (STD) testing.  Bone density scan. This is done  to screen for osteoporosis. You may have this done starting at age 94. Follow these instructions at home: Eating and drinking  Eat a diet that includes fresh fruits and vegetables, whole grains, lean protein, and low-fat dairy products. Limit your intake of foods with high amounts of sugar, saturated fats, and salt.  Take vitamin and mineral supplements as recommended by your health care provider.  Do not drink alcohol if your health care provider tells you not to drink.  If you drink alcohol: ? Limit how much you have to 0-1 drink a day. ? Be aware of how much alcohol is in your drink. In the [place] one drink equals one 12 oz bottle of beer (355 mL), one 5 oz glass of wine (148 mL), or one 1 oz glass of hard liquor (44 mL). Lifestyle  Take daily care of your teeth and gums.  Stay active. Exercise for at least 30 minutes on 5 or more days each week.  Do not use any products that contain nicotine or tobacco, such as cigarettes, e-cigarettes, and chewing tobacco. If you need help quitting, ask your health care provider.  If you are sexually active, practice safe sex. Use a condom or other form of protection in order to prevent STIs (sexually transmitted infections).  Talk with your health care provider about taking a low-dose aspirin or statin. What's next?  Go to your health care provider once a year for a well check visit.  Ask your health care provider how often you should have your eyes and teeth checked.  Stay up to date on all vaccines. This information is not intended to replace advice given to you by your health care provider. Make sure you discuss any questions you have with your health care provider. Document Revised: 01/20/2018 Document Reviewed: 01/20/2018 Elsevier Patient Education  2020 [employer].

## 2019-06-20 NOTE — Progress Notes (Signed)
Presents today for [employer] Visit   Date of last exam:03-22-2019   Interpreter used for this visit?  No  I connected with  [name] on 06/20/19  telephone n and verified that I am speaking with the correct person using two identifiers.   I discussed the limitations of evaluation and management by telemedicine. The patient expressed understanding and agreed to proceed.    Patient Care Team: Wendie Agreste, MD as PCP - General (Family Medicine) Webb Laws, [place] as Referring Physician (Optometry) Chyrl Civatte Arletta Bale as Physician Assistant (Neurosurgery)   Other items to address today:    Other Screening: Last screening for diabetes /24/2019 Last lipid screening 03/10/2018:   ADVANCE DIRECTIVES: Discussed: no On File: no Materials Provided:  no  Immunization status:  Immunization History  Administered Date(s) Administered  . Influenza Split 11/10/2011, 10/25/2014  . Influenza,inj,Quad PF,6+ Mos 12/06/2012, 11/10/2014, 10/15/2016  . Influenza-Unspecified 10/10/2013, 11/05/2015, 10/19/2017  . Moderna SARS-COVID-2 Vaccination 03/25/2019, 04/22/2019  . Pneumococcal Conjugate-13 01/18/2015, 10/19/2017  . Pneumococcal-Unspecified 12/15/2004  . Tdap 09/02/2006, 11/05/2015  . Zoster 11/25/2012  . Zoster Recombinat (Shingrix) 08/06/2016, 10/17/2016     There are no preventive care reminders to display for this patient.   Functional Status Survey: Is the patient deaf or have difficulty hearing?: No Does the patient have difficulty seeing, even when wearing glasses/contacts?: No Does the patient have difficulty concentrating, remembering, or making decisions?: No Does the patient have difficulty walking or climbing stairs?: Yes(back pain can climb takes a while) Does the patient have difficulty dressing or bathing?: No Does the patient have difficulty doing errands alone such as visiting a doctor's office or shopping?:  No   6CIT Screen 06/20/2019 05/16/2018 10/15/2016  What Year? 0 points 0 points 0 points  What month? 0 points 0 points 0 points  What time? 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   Patient Active Problem List   Diagnosis Date Noted  . Trochanteric bursitis, left hip 07/02/2016  . Essential hypertension 03/16/2016  . Hyperlipidemia 03/16/2016  . SBO (small bowel obstruction) ([place]) 01/30/2013  . Tobacco abuse 01/30/2013  . Degenerative arthritis of left hip 01/24/2013  . Status post THR (total hip replacement) 01/24/2013     Past Medical History:  Diagnosis Date  . Chronic bronchitis ([place])    "when I get a cold I normally get bronchitis" (01/30/2013)  . Hepatitis C   . Hyperlipidemia   . Hypertension      Past Surgical History:  Procedure Laterality Date  . ABDOMINAL HYSTERECTOMY  1990's  . BACK SURGERY     x5  . CHOLECYSTECTOMY  1990's  . COLONOSCOPY    . JOINT REPLACEMENT Left 01/24/2013   hip  . LUMBAR SPINE SURGERY  2010; 2011   "tightened up some screws" (01/30/2013)  . POSTERIOR LUMBAR FUSION  2006 X 2; 2008  . TOTAL HIP ARTHROPLASTY Left 01/24/2013  Procedure: LEFT TOTAL HIP ARTHROPLASTY ANTERIOR APPROACH;  Surgeon: Mcarthur Rossetti, MD;  Location: [place];  Service: Orthopedics;  Laterality: Left;  . TUBAL LIGATION  1970's     Family History  Problem Relation Age of Onset  . Throat cancer Mother   . Breast cancer Sister   . Prostate cancer Brother      Social History   Socioeconomic History  . Marital status:  Married    Spouse name: Not on file  . Number of children: Not on file  . Years of education: Not on file  . Highest education level: Not on file  Occupational History  . Not on file  Tobacco Use  . Smoking status: Former Smoker    Packs/day: 1.00    Years: 45.00    Pack years: 45.00    Types: Cigarettes    Quit date: 05/25/2015    Years since quitting: 4.0  . Smokeless tobacco: Never Used  Substance and Sexual Activity  . Alcohol use: No    Alcohol/week: 0.0 standard drinks    Comment: 01/30/2013 "used to drink beer; none since ~ 1989"  . Drug use: No  . Sexual activity: Yes    Birth control/protection: Post-menopausal  Other Topics Concern  . Not on file  Social History Narrative  . Not on file   Social Determinants of Health   Financial Resource Strain:   . Difficulty of Paying Living Expenses:   Food Insecurity:   . Worried About Charity fundraiser in the Last Year:   . Arboriculturist in the Last Year:   Transportation Needs:   . Film/video editor (Medical):   Marland Kitchen Lack of Transportation (Non-Medical):   Physical Activity:   . Days of Exercise per Week:   . Minutes of Exercise per Session:   Stress:   . Feeling of Stress :   Social Connections:   . Frequency of Communication with Friends and Family:   . Frequency of Social Gatherings with Friends and Family:   . Attends Religious Services:   . Active Member of Clubs or Organizations:   . Attends Archivist Meetings:   Marland Kitchen Marital Status:   Intimate Partner Violence:   . Fear of Current or Ex-Partner:   . Emotionally Abused:   Marland Kitchen Physically Abused:   . Sexually Abused:      No Known Allergies   Prior to Admission medications   Medication Sig Start Date End Date Taking? Authorizing Provider  aspirin 81 MG tablet Take 81 mg by mouth daily.   Yes [provider]  hydrochlorothiazide (HYDRODIURIL) 25 MG tablet TAKE 1 TABLET(25 MG) BY MOUTH DAILY 09/05/18  Yes Wendie Agreste, MD   HYDROcodone-acetaminophen (NORCO) 10-325 MG tablet Take 1 tablet by mouth every 8 (eight) hours as needed (pain).    Yes [provider]  methocarbamol (ROBAXIN) 500 MG tablet Take 500 mg by mouth 4 (four) times daily.   Yes [provider]  Misc. Devices [facility]) MISC 1 Device by Does not apply route as needed. Needed for low back pain, history of lumbar surgery, rollator insufficient for walking due to bursitis and back pain. 08/27/16  Yes Wendie Agreste, MD  NAPROXEN 375 MG TBEC EC tablet TAKE 1 TABLET BY MOUTH 2 TIMES DAILY. TAKE 1000MG  TYLENOL EVERY 8 HOURS WITH THIS AND YOUR CHRONIC PAIN MEDS 09/21/17  Yes Wendie Agreste, MD  simvastatin (ZOCOR) 40 MG tablet TAKE 1 TABLET(40 MG) BY MOUTH  DAILY 05/22/19  Yes Wendie Agreste, MD  Olopatadine HCl (PAZEO) 0.7 % SOLN Place 1 drop into both eyes daily.    [provider]     Depression screen [facility] 2/9 06/20/2019 03/22/2019 03/10/2018 09/06/2017 08/25/2017  Decreased Interest 0 0 0 0 0  Down, Depressed, Hopeless 0 0 0 0 -  PHQ - 2 Score 0 0 0 0 0     Fall Risk  06/20/2019 03/22/2019 05/16/2018 03/10/2018 09/06/2017  Falls in the past year? 0 0 0 0 No  Number falls in past yr: 0 0 0 0 -  Injury with Fall? 0 0 0 0 -  Follow up Falls evaluation completed;Education provided Falls evaluation completed - Falls evaluation completed -      PHYSICAL EXAM: BP 117/67 Comment: taken previous visit  Ht 5\' 5"  (1.651 m)   Wt 160 lb (72.6 kg)   BMI 26.63 kg/m    Wt Readings from Last 3 Encounters:  06/20/19 160 lb (72.6 kg)  03/22/19 160 lb (72.6 kg)  05/16/18 161 lb (73 kg)       Education/Counseling provided regarding diet and exercise, prevention of chronic diseases, smoking/tobacco cessation, if applicable, and reviewed "Covered Medicare Preventive Services."

## 2019-07-16 ENCOUNTER — Other Ambulatory Visit: Payer: Self-pay | Admitting: Family Medicine

## 2019-07-16 DIAGNOSIS — E785 Hyperlipidemia, unspecified: Secondary | ICD-10-CM

## 2019-07-16 NOTE — Telephone Encounter (Signed)
Requested medications are due for refill today?  Yes  Requested medications are on active medication list? Yes  Last Refill:  05/22/2019  # 30 with one refill   Future visit scheduled?  No   Notes to Clinic:  Medication failed RX refill protocol due to no labs within the past 360 days.  Last labs were performed on 03/10/2018.

## 2019-07-17 ENCOUNTER — Encounter: Payer: Self-pay | Admitting: Family Medicine

## 2019-07-17 DIAGNOSIS — I10 Essential (primary) hypertension: Secondary | ICD-10-CM

## 2019-07-17 MED ORDER — HYDROCHLOROTHIAZIDE 25 MG PO TABS: ORAL_TABLET | ORAL | 1 refills | Status: DC

## 2019-07-31 DIAGNOSIS — M546 Pain in thoracic spine: Secondary | ICD-10-CM | POA: Diagnosis not present

## 2019-07-31 DIAGNOSIS — M961 Postlaminectomy syndrome, not elsewhere classified: Secondary | ICD-10-CM | POA: Diagnosis not present

## 2019-09-08 DIAGNOSIS — M546 Pain in thoracic spine: Secondary | ICD-10-CM | POA: Diagnosis not present

## 2019-09-08 DIAGNOSIS — M961 Postlaminectomy syndrome, not elsewhere classified: Secondary | ICD-10-CM | POA: Diagnosis not present

## 2019-09-08 DIAGNOSIS — M62838 Other muscle spasm: Secondary | ICD-10-CM | POA: Diagnosis not present

## 2019-09-10 ENCOUNTER — Other Ambulatory Visit: Payer: Self-pay | Admitting: Family Medicine

## 2019-09-10 DIAGNOSIS — E785 Hyperlipidemia, unspecified: Secondary | ICD-10-CM

## 2019-09-12 ENCOUNTER — Other Ambulatory Visit: Payer: Self-pay | Admitting: Family Medicine

## 2019-09-12 DIAGNOSIS — Z1231 Encounter for screening mammogram for malignant neoplasm of breast: Secondary | ICD-10-CM

## 2019-09-26 DIAGNOSIS — Z20828 Contact with and (suspected) exposure to other viral communicable diseases: Secondary | ICD-10-CM | POA: Diagnosis not present

## 2019-10-02 ENCOUNTER — Other Ambulatory Visit: Payer: Self-pay

## 2019-10-02 ENCOUNTER — Ambulatory Visit
Admission: RE | Admit: 2019-10-02 | Discharge: 2019-10-02 | Disposition: A | Discharge status: Home / Self Care | Payer: PPO | Source: Ambulatory Visit | Attending: Family Medicine | Admitting: Family Medicine

## 2019-10-02 DIAGNOSIS — Z1231 Encounter for screening mammogram for malignant neoplasm of breast: Secondary | ICD-10-CM | POA: Diagnosis not present

## 2019-11-09 ENCOUNTER — Other Ambulatory Visit: Payer: Self-pay | Admitting: Family Medicine

## 2019-11-09 DIAGNOSIS — E785 Hyperlipidemia, unspecified: Secondary | ICD-10-CM

## 2019-11-09 NOTE — Telephone Encounter (Signed)
Requested Prescriptions  Pending Prescriptions Disp Refills  . simvastatin (ZOCOR) 40 MG tablet [Pharmacy Med Name: SIMVASTATIN 40MG  TABLETS] 30 tablet 1    Sig: TAKE 1 TABLET(40 MG) BY MOUTH DAILY     Cardiovascular:  Antilipid - Statins Failed - 11/09/2019  6:34 AM      Failed - Total Cholesterol in normal range and within 360 days    Cholesterol, Total  Date Value Ref Range Status  03/10/2018 125 100 - 199 mg/dL Final         Failed - LDL in normal range and within 360 days    LDL Calculated  Date Value Ref Range Status  03/10/2018 59 0 - 99 mg/dL Final         Failed - HDL in normal range and within 360 days    HDL  Date Value Ref Range Status  03/10/2018 54 >39 mg/dL Final         Failed - Triglycerides in normal range and within 360 days    Triglycerides  Date Value Ref Range Status  03/10/2018 60 0 - 149 mg/dL Final         Passed - Patient is not pregnant      Passed - Valid encounter within last 12 months    Recent Outpatient Visits          4 months ago Medicare annual wellness visit, subsequent   Primary Care at [facility], Arlie Solomons, MD   7 months ago    Primary Care at [facility], Ranell Patrick, MD   7 months ago Temporal headache   Primary Care at [facility], Ranell Patrick, MD   1 year ago Medicare annual wellness visit, subsequent   Primary Care at [facility], Arlie Solomons, MD   1 year ago Essential hypertension   Primary Care at [facility], Ranell Patrick, MD

## 2019-12-04 DIAGNOSIS — M961 Postlaminectomy syndrome, not elsewhere classified: Secondary | ICD-10-CM | POA: Diagnosis not present

## 2019-12-04 DIAGNOSIS — M62838 Other muscle spasm: Secondary | ICD-10-CM | POA: Diagnosis not present

## 2019-12-04 DIAGNOSIS — M546 Pain in thoracic spine: Secondary | ICD-10-CM | POA: Diagnosis not present

## 2020-01-08 ENCOUNTER — Other Ambulatory Visit: Payer: Self-pay | Admitting: Family Medicine

## 2020-01-08 DIAGNOSIS — E785 Hyperlipidemia, unspecified: Secondary | ICD-10-CM

## 2020-01-09 ENCOUNTER — Telehealth: Payer: Self-pay | Admitting: *Deleted

## 2020-01-09 ENCOUNTER — Other Ambulatory Visit: Payer: Self-pay | Admitting: Family Medicine

## 2020-01-09 ENCOUNTER — Other Ambulatory Visit: Payer: Self-pay

## 2020-01-09 DIAGNOSIS — I10 Essential (primary) hypertension: Secondary | ICD-10-CM

## 2020-01-09 MED ORDER — HYDROCHLOROTHIAZIDE 25 MG PO TABS: ORAL_TABLET | ORAL | 0 refills | Status: DC

## 2020-01-09 NOTE — Telephone Encounter (Signed)
Called pt to sch refill appt. For 01/31/20 that was providers next availability. Pt is needing a curtsey refill because she only has 3 pills left to get her to her appt. Please advise.

## 2020-01-09 NOTE — Telephone Encounter (Signed)
Entered in error

## 2020-01-09 NOTE — Telephone Encounter (Signed)
30 days sent

## 2020-01-29 ENCOUNTER — Ambulatory Visit (INDEPENDENT_AMBULATORY_CARE_PROVIDER_SITE_OTHER): Payer: PPO | Admitting: Family Medicine

## 2020-01-29 ENCOUNTER — Other Ambulatory Visit: Payer: Self-pay

## 2020-01-29 ENCOUNTER — Encounter: Payer: Self-pay | Admitting: Family Medicine

## 2020-01-29 VITALS — BP 120/73 | HR 60 | Temp 97.2°F | Ht 65.0 in | Wt 163.0 lb

## 2020-01-29 DIAGNOSIS — Z130 Encounter for screening for diseases of the blood and blood-forming organs and certain disorders involving the immune mechanism: Secondary | ICD-10-CM | POA: Diagnosis not present

## 2020-01-29 DIAGNOSIS — E785 Hyperlipidemia, unspecified: Secondary | ICD-10-CM | POA: Diagnosis not present

## 2020-01-29 DIAGNOSIS — Z1321 Encounter for screening for nutritional disorder: Secondary | ICD-10-CM

## 2020-01-29 DIAGNOSIS — Z1329 Encounter for screening for other suspected endocrine disorder: Secondary | ICD-10-CM | POA: Diagnosis not present

## 2020-01-29 DIAGNOSIS — M25522 Pain in left elbow: Secondary | ICD-10-CM

## 2020-01-29 DIAGNOSIS — I10 Essential (primary) hypertension: Secondary | ICD-10-CM | POA: Diagnosis not present

## 2020-01-29 DIAGNOSIS — Z131 Encounter for screening for diabetes mellitus: Secondary | ICD-10-CM | POA: Diagnosis not present

## 2020-01-29 MED ORDER — HYDROCHLOROTHIAZIDE 25 MG PO TABS: ORAL_TABLET | ORAL | 2 refills | Status: DC

## 2020-01-29 MED ORDER — SIMVASTATIN 40 MG PO TABS: ORAL_TABLET | ORAL | 2 refills | Status: DC

## 2020-01-29 NOTE — Progress Notes (Signed)
12/20/202110:28 AM  [name] [birth year], 69 y.o., female [id number]  Chief Complaint  Patient presents with  . Left arm pain     Pain for about 2 weeks worse on the second week, works with repetitive arm movement Stopped methocarbamol due to swelling in hands and feet - still some swelling on top of feet    HPI:   Patient is a 69 y.o. female with past medical history significant for HTN, arthritis, HLD who presents today for left arm pain.  Left upper Arm pain 1st week not as bad Got much worse last week Then started improving, and now no more pain Happens occasionally Volunteers at [place] hydrocodone for pain as needed Gets foot cramps at night  Screenings Vaccinations up to date per pt Flu shot done at [facility] with covid booster Dexa: 2018 Colonoscopy: 2017 Mammogram: 2021  HTN BP at goal< 130/80 BP Readings from Last 3 Encounters:  01/29/20 120/73  06/20/19 117/67  03/10/18 117/67     Depression screen PHQ 2/9 01/29/2020 06/20/2019 03/22/2019  Decreased Interest 0 0 0  Down, Depressed, Hopeless 0 0 0  PHQ - 2 Score 0 0 0    Fall Risk  01/29/2020 06/20/2019 03/22/2019 05/16/2018 03/10/2018  Falls in the past year? 0 0 0 0 0  Number falls in past yr: 0 0 0 0 0  Injury with Fall? 0 0 0 0 0  Follow up Falls evaluation completed Falls evaluation completed;Education provided Falls evaluation completed - Falls evaluation completed     No Known Allergies  Prior to Admission medications   Medication Sig Start Date End Date Taking? Authorizing Provider  aspirin 81 MG tablet Take 81 mg by mouth daily.   Yes [provider]  hydrochlorothiazide (HYDRODIURIL) 25 MG tablet Take one daily by mouth 01/09/20  Yes Wendie Agreste, MD  HYDROcodone-acetaminophen (NORCO) 10-325 MG tablet Take 1 tablet by mouth every 8 (eight) hours as needed (pain).    Yes [provider]  Misc. Devices [facility]) MISC 1 Device by Does not apply route  as needed. Needed for low back pain, history of lumbar surgery, rollator insufficient for walking due to bursitis and back pain. 08/27/16  Yes Wendie Agreste, MD  NAPROXEN 375 MG TBEC EC tablet TAKE 1 TABLET BY MOUTH 2 TIMES DAILY. TAKE 1000MG TYLENOL EVERY 8 HOURS WITH THIS AND YOUR CHRONIC PAIN MEDS 09/21/17  Yes Wendie Agreste, MD  simvastatin (ZOCOR) 40 MG tablet TAKE 1 TABLET(40 MG) BY MOUTH DAILY 01/08/20  Yes Wendie Agreste, MD  methocarbamol (ROBAXIN) 500 MG tablet Take 500 mg by mouth 4 (four) times daily. Patient not taking: Reported on 01/29/2020    [provider]  Olopatadine HCl (PAZEO) 0.7 % SOLN Place 1 drop into both eyes daily.    [provider]    Past Medical History:  Diagnosis Date  . Chronic bronchitis ([place])    "when I get a cold I normally get bronchitis" (01/30/2013)  . Hepatitis C   . Hyperlipidemia   . Hypertension     Past Surgical History:  Procedure Laterality Date  . ABDOMINAL HYSTERECTOMY  1990's  . BACK SURGERY     x5  . CHOLECYSTECTOMY  1990's  . COLONOSCOPY    . JOINT REPLACEMENT Left 01/24/2013   hip  . LUMBAR SPINE SURGERY  2010; 2011   "tightened up some screws" (01/30/2013)  . POSTERIOR LUMBAR FUSION  2006 X 2; 2008  . TOTAL  HIP ARTHROPLASTY Left 01/24/2013   Procedure: LEFT TOTAL HIP ARTHROPLASTY ANTERIOR APPROACH;  Surgeon: Mcarthur Rossetti, MD;  Location: [place];  Service: Orthopedics;  Laterality: Left;  . TUBAL LIGATION  1970's    Social History   Tobacco Use  . Smoking status: Former Smoker    Packs/day: 1.00    Years: 45.00    Pack years: 45.00    Types: Cigarettes    Quit date: 05/25/2015    Years since quitting: 4.6  . Smokeless tobacco: Never Used  Substance Use Topics  . Alcohol use: No    Alcohol/week: 0.0 standard drinks    Comment: 01/30/2013 "used to drink beer; none since ~ 1989"    Family History  Problem Relation Age of Onset  . Throat cancer Mother   . Breast cancer Sister    . Prostate cancer Brother     Review of Systems  Constitutional: Negative for chills, fever and malaise/fatigue.  Eyes: Negative for blurred vision and double vision.  Respiratory: Negative for cough, shortness of breath and wheezing.   Cardiovascular: Negative for chest pain, palpitations and leg swelling.  Gastrointestinal: Negative for abdominal pain, blood in stool, constipation, diarrhea, heartburn, nausea and vomiting.  Genitourinary: Negative for dysuria, frequency and hematuria.  Musculoskeletal: Positive for back pain (Baseline), joint pain (Left elbow) and neck pain (neck to arm pain). Negative for falls and myalgias.  Skin: Negative for rash.  Neurological: Negative for dizziness, sensory change, focal weakness, weakness and headaches.     OBJECTIVE:  Today's Vitals   01/29/20 0927  BP: 120/73  Pulse: 60  Temp: (!) 97.2 F (36.2 C)  SpO2: 98%  Weight: 163 lb (73.9 kg)  Height: _0  (1.651 m)   Body mass index is 27.12 kg/m.   Physical Exam Constitutional:      General: She is not in acute distress.    Appearance: Normal appearance. She is not ill-appearing.  HENT:     Head: Normocephalic.  Cardiovascular:     Rate and Rhythm: Normal rate and regular rhythm.     Pulses: Normal pulses.     Heart sounds: Normal heart sounds. No murmur heard. No friction rub. No gallop.   Pulmonary:     Effort: Pulmonary effort is normal. No respiratory distress.     Breath sounds: Normal breath sounds. No stridor. No wheezing, rhonchi or rales.  Abdominal:     General: Bowel sounds are normal.     Palpations: Abdomen is soft.     Tenderness: There is no abdominal tenderness.  Musculoskeletal:     Right lower leg: No edema.     Left lower leg: No edema.  Skin:    General: Skin is warm and dry.  Neurological:     Mental Status: She is alert and oriented to person, place, and time.  Psychiatric:        Mood and Affect: Mood normal.        Behavior: Behavior normal.      No results found for this or any previous visit (from the past 24 hour(s)).  No results found.   ASSESSMENT and PLAN  Problem List Items Addressed This Visit      Cardiovascular and Mediastinum   Essential hypertension   Relevant Medications   simvastatin (ZOCOR) 40 MG tablet   hydrochlorothiazide (HYDRODIURIL) 25 MG tablet   Other Relevant Orders   CMP14+EGFR  BP at goal< 130/80  Will follow up with lab results     Other  Hyperlipidemia   Relevant Medications   simvastatin (ZOCOR) 40 MG tablet   hydrochlorothiazide (HYDRODIURIL) 25 MG tablet   Other Relevant Orders   Lipid Panel    Other Visit Diagnoses    Left elbow pain    -  Primary Most likely from overuse. Modify lifestyle as needed Take naproxen for pain.    Encounter for vitamin deficiency screening       Relevant Orders   Vitamin D, 25-hydroxy   Screening for thyroid disorder       Relevant Orders   TSH   Screening, anemia, deficiency, iron       Relevant Orders   CBC   Screening for diabetes mellitus       Relevant Orders   Hemoglobin A1c     Will follow up with lab results  Return in about 3 months (around 04/28/2020) for medication follow up.   Huston Foley Mikolaj Woolstenhulme, FNP-BC Primary Care at [address], [place] [address] Ph.  [phone] Fax [phone]

## 2020-01-29 NOTE — Patient Instructions (Addendum)
° °  Elbow Bursitis Bursitis is swelling and pain at the tip of your elbow. This happens when fluid builds up in a sac under your skin (bursa). This may also be called olecranon bursitis. Follow these instructions at home: Medicines  Take over-the-counter and prescription medicines only as told by your doctor.  If you were prescribed an antibiotic, take it exactly as told by your doctor. Do not stop taking it even if you start to feel better. Managing pain, stiffness, and swelling   If told, put ice on your elbow: ? Put ice in a plastic bag. ? Place a towel between your skin and the bag. ? Leave the ice on for 20 minutes, 2-3 times a day.  If your bursitis is caused by an injury, follow instructions from your doctor about: ? Resting your elbow. ? Wearing a bandage.  Wear elbow pads or elbow wraps as needed. These help cushion your elbow. General instructions  Avoid any activities that cause elbow pain. Ask your doctor what activities are safe for you.  Keep all follow-up visits as told by your doctor. This is important. Contact a doctor if you have:  A fever.  Problems that do not get better with treatment.  Pain or swelling that: ? Gets worse. ? Goes away and then comes back.  Pus draining from your elbow. Get help right away if you have:  Trouble moving your arm, hand, or fingers. Summary  Bursitis is swelling and pain at the tip of the elbow.  You may need to take medicine or put ice on your elbow.  Contact your doctor if your problems do not get better with treatment. This information is not intended to replace advice given to you by your health care provider. Make sure you discuss any questions you have with your health care provider. Document Revised: 01/08/2017 Document Reviewed: 01/05/2017 Elsevier Patient Education  [employer].    If you have lab work done today you will be contacted with your lab results within the next 2 weeks.  If you have not  heard from [place] then please contact us. The fastest way to get your results is to register for My Chart.   IF you received an x-ray today, you will receive an invoice from [facility]. Please contact [facility] at [phone] with questions or concerns regarding your invoice.   IF you received labwork today, you will receive an invoice from [place]. Please contact [facility] at [phone] with questions or concerns regarding your invoice.   Our billing staff will not be able to assist you with questions regarding bills from these companies.  You will be contacted with the lab results as soon as they are available. The fastest way to get your results is to activate your My Chart account. Instructions are located on the last page of this paperwork. If you have not heard from [place] regarding the results in 2 weeks, please contact this office.

## 2020-01-30 ENCOUNTER — Encounter: Payer: Self-pay | Admitting: Family Medicine

## 2020-01-30 DIAGNOSIS — R7303 Prediabetes: Secondary | ICD-10-CM | POA: Insufficient documentation

## 2020-01-30 LAB — HEMOGLOBIN A1C
Est. average glucose Bld gHb Est-mCnc: 131 mg/dL
Hgb A1c MFr Bld: 6.2 % — ABNORMAL HIGH (ref 4.8–5.6)

## 2020-01-30 LAB — CMP14+EGFR
ALT: 27 IU/L (ref 0–32)
AST: 33 IU/L (ref 0–40)
Albumin/Globulin Ratio: 1.5 (ref 1.2–2.2)
Albumin: 4.8 g/dL (ref 3.8–4.8)
Alkaline Phosphatase: 104 IU/L (ref 44–121)
BUN/Creatinine Ratio: 20 (ref 12–28)
BUN: 15 mg/dL (ref 8–27)
Bilirubin Total: 0.5 mg/dL (ref 0.0–1.2)
CO2: 24 mmol/L (ref 20–29)
Calcium: 10.1 mg/dL (ref 8.7–10.3)
Chloride: 102 mmol/L (ref 96–106)
Creatinine, Ser: 0.75 mg/dL (ref 0.57–1.00)
GFR calc Af Amer: 95 mL/min/{1.73_m2} (ref >59)
GFR calc non Af Amer: 82 mL/min/{1.73_m2} (ref >59)
Globulin, Total: 3.1 g/dL (ref 1.5–4.5)
Glucose: 100 mg/dL — ABNORMAL HIGH (ref 65–99)
Potassium: 4.1 mmol/L (ref 3.5–5.2)
Sodium: 141 mmol/L (ref 134–144)
Total Protein: 7.9 g/dL (ref 6.0–8.5)

## 2020-01-30 LAB — TSH: TSH: 0.679 u[IU]/mL (ref 0.450–4.500)

## 2020-01-30 LAB — CBC
Hematocrit: 38.7 % (ref 34.0–46.6)
Hemoglobin: 13.2 g/dL (ref 11.1–15.9)
MCH: 30.4 pg (ref 26.6–33.0)
MCHC: 34.1 g/dL (ref 31.5–35.7)
MCV: 89 fL (ref 79–97)
Platelets: 272 10*3/uL (ref 150–450)
RBC: 4.34 x10E6/uL (ref 3.77–5.28)
RDW: 11.8 % (ref 11.7–15.4)
WBC: 7 10*3/uL (ref 3.4–10.8)

## 2020-01-30 LAB — LIPID PANEL
Chol/HDL Ratio: 2.2 ratio (ref 0.0–4.4)
Cholesterol, Total: 139 mg/dL (ref 100–199)
HDL: 62 mg/dL (ref >39)
LDL Chol Calc (NIH): 66 mg/dL (ref 0–99)
Triglycerides: 47 mg/dL (ref 0–149)
VLDL Cholesterol Cal: 11 mg/dL (ref 5–40)

## 2020-01-30 LAB — VITAMIN D 25 HYDROXY (VIT D DEFICIENCY, FRACTURES): Vit D, 25-Hydroxy: 16 ng/mL — ABNORMAL LOW (ref 30.0–100.0)

## 2020-01-31 ENCOUNTER — Ambulatory Visit: Payer: PPO | Admitting: Family Medicine

## 2020-02-01 ENCOUNTER — Telehealth: Payer: Self-pay | Admitting: Family Medicine

## 2020-02-01 NOTE — Telephone Encounter (Signed)
Patient called to give [place] the dates of her COVID vaccines and booster shot as instructed by clinical nurse.  [place] vaccines: 03/25/1919, 04/21/2019 Pfizer booster shot: 12/05/2019  Patient will bring immunization card for nurse to copy and upload into chart next time she's here.

## 2020-02-07 ENCOUNTER — Ambulatory Visit: Payer: PPO | Admitting: Family Medicine

## 2020-03-05 DIAGNOSIS — M62838 Other muscle spasm: Secondary | ICD-10-CM | POA: Diagnosis not present

## 2020-03-05 DIAGNOSIS — M546 Pain in thoracic spine: Secondary | ICD-10-CM | POA: Diagnosis not present

## 2020-03-05 DIAGNOSIS — Z79899 Other long term (current) drug therapy: Secondary | ICD-10-CM | POA: Diagnosis not present

## 2020-03-05 DIAGNOSIS — M961 Postlaminectomy syndrome, not elsewhere classified: Secondary | ICD-10-CM | POA: Diagnosis not present

## 2020-05-03 ENCOUNTER — Other Ambulatory Visit: Payer: Self-pay

## 2020-05-03 ENCOUNTER — Ambulatory Visit (INDEPENDENT_AMBULATORY_CARE_PROVIDER_SITE_OTHER): Payer: Medicare HMO | Admitting: Family Medicine

## 2020-05-03 ENCOUNTER — Encounter: Payer: Self-pay | Admitting: Family Medicine

## 2020-05-03 VITALS — BP 129/81 | HR 72 | Temp 98.0°F | Ht 65.0 in | Wt 168.0 lb

## 2020-05-03 DIAGNOSIS — I10 Essential (primary) hypertension: Secondary | ICD-10-CM

## 2020-05-03 DIAGNOSIS — R7303 Prediabetes: Secondary | ICD-10-CM | POA: Diagnosis not present

## 2020-05-03 DIAGNOSIS — E785 Hyperlipidemia, unspecified: Secondary | ICD-10-CM

## 2020-05-03 NOTE — Progress Notes (Signed)
Subjective:  Patient ID: [name], female    DOB: 13-Oct-1950  Age: 70 y.o. MRN: [id number]  CC:  Chief Complaint  Patient presents with  . Follow-up    On hypertension and hyperlipidemia. Pt reports no issues with these conditions since last OV.    HPI [name] presents for   Hypertension: Hydrochlorothiazide 25 mg daily. Has been doing well.  Home readings:none.  No new side effects.  BP Readings from Last 3 Encounters:  05/03/20 129/81  01/29/20 120/73  06/20/19 117/67   Lab Results  Component Value Date   CREATININE 0.75 01/29/2020   Hyperlipidemia: Simvastatin 40 mg daily. No new myalgias/side effects.  Lab Results  Component Value Date   CHOL 139 01/29/2020   HDL 62 01/29/2020   LDLCALC 66 01/29/2020   TRIG 47 01/29/2020   CHOLHDL 2.2 01/29/2020   Lab Results  Component Value Date   ALT 27 01/29/2020   AST 33 01/29/2020   ALKPHOS 104 01/29/2020   BILITOT 0.5 01/29/2020   Prediabetes: Some pepsi - 2 per day.   Lab Results  Component Value Date   HGBA1C 6.2 (H) 01/29/2020   Wt Readings from Last 3 Encounters:  05/03/20 168 lb (76.2 kg)  01/29/20 163 lb (73.9 kg)  06/20/19 160 lb (72.6 kg)    History Patient Active Problem List   Diagnosis Date Noted  . Pre-diabetes 01/30/2020  . Trochanteric bursitis, left hip 07/02/2016  . Essential hypertension 03/16/2016  . Hyperlipidemia 03/16/2016  . SBO (small bowel obstruction) ([place]) 01/30/2013  . Tobacco abuse 01/30/2013  . Degenerative arthritis of left hip 01/24/2013  . Status post THR (total hip replacement) 01/24/2013   Past Medical History:  Diagnosis Date  . Chronic bronchitis ([place])    "when I get a cold I normally get bronchitis" (01/30/2013)  . Hepatitis C   . Hyperlipidemia   . Hypertension    Past Surgical History:  Procedure Laterality Date  . ABDOMINAL HYSTERECTOMY  1990's  . BACK SURGERY     x5  . CHOLECYSTECTOMY  1990's  . COLONOSCOPY    . JOINT REPLACEMENT Left  01/24/2013   hip  . LUMBAR SPINE SURGERY  2010; 2011   "tightened up some screws" (01/30/2013)  . POSTERIOR LUMBAR FUSION  2006 X 2; 2008  . TOTAL HIP ARTHROPLASTY Left 01/24/2013   Procedure: LEFT TOTAL HIP ARTHROPLASTY ANTERIOR APPROACH;  Surgeon: Mcarthur Rossetti, MD;  Location: [facility];  Service: Orthopedics;  Laterality: Left;  . TUBAL LIGATION  1970's   No Known Allergies Prior to Admission medications   Medication Sig Start Date End Date Taking? Authorizing Provider  aspirin 81 MG tablet Take 81 mg by mouth daily.   Yes [provider]  gabapentin (NEURONTIN) 100 MG capsule Take 100 mg by mouth daily. 03/05/20  Yes [provider]  hydrochlorothiazide (HYDRODIURIL) 25 MG tablet Take one daily by mouth 01/29/20  Yes Just, Laurita Quint, FNP  HYDROcodone-acetaminophen (NORCO) 10-325 MG tablet Take 1 tablet by mouth every 8 (eight) hours as needed (pain).    Yes [provider]  Misc. Devices [facility]) MISC 1 Device by Does not apply route as needed. Needed for low back pain, history of lumbar surgery, rollator insufficient for walking due to bursitis and back pain. 08/27/16  Yes Wendie Agreste, MD  simvastatin (ZOCOR) 40 MG tablet TAKE 1 TABLET(40 MG) BY MOUTH DAILY 01/29/20  Yes Just, Laurita Quint, FNP  methocarbamol (ROBAXIN) 500 MG tablet Take  500 mg by mouth 4 (four) times daily. Patient not taking: No sig reported    [provider]  NAPROXEN 375 MG TBEC EC tablet TAKE 1 TABLET BY MOUTH 2 TIMES DAILY. TAKE 1000MG  TYLENOL EVERY 8 HOURS WITH THIS AND YOUR CHRONIC PAIN MEDS Patient not taking: Reported on 05/03/2020 09/21/17   Wendie Agreste, MD   Social History   Socioeconomic History  . Marital status: Married    Spouse name: Not on file  . Number of children: Not on file  . Years of education: Not on file  . Highest education level: Not on file  Occupational History  . Not on file  Tobacco Use  . Smoking status: Former Smoker     Packs/day: 1.00    Years: 45.00    Pack years: 45.00    Types: Cigarettes    Quit date: 05/25/2015    Years since quitting: 4.9  . Smokeless tobacco: Never Used  Vaping Use  . Vaping Use: Never used  Substance and Sexual Activity  . Alcohol use: No    Alcohol/week: 0.0 standard drinks    Comment: 01/30/2013 "used to drink beer; none since ~ 1989"  . Drug use: No  . Sexual activity: Yes    Birth control/protection: Post-menopausal  Other Topics Concern  . Not on file  Social History Narrative  . Not on file   Social Determinants of Health   Financial Resource Strain: Not on file  Food Insecurity: Not on file  Transportation Needs: Not on file  Physical Activity: Not on file  Stress: Not on file  Social Connections: Not on file  Intimate Partner Violence: Not on file    Review of Systems  Constitutional: Negative for fatigue and unexpected weight change.  Respiratory: Negative for chest tightness and shortness of breath.   Cardiovascular: Negative for chest pain, palpitations and leg swelling.  Gastrointestinal: Negative for abdominal pain and blood in stool.  Neurological: Negative for dizziness, syncope, light-headedness and headaches.     Objective:   Vitals:   05/03/20 1329  BP: 129/81  Pulse: 72  Temp: 98 F (36.7 C)  TempSrc: Temporal  SpO2: 97%  Weight: 168 lb (76.2 kg)  Height: 5\' 5"  (1.651 m)    Physical Exam Vitals reviewed.  Constitutional:      Appearance: She is well-developed.  HENT:     Head: Normocephalic and atraumatic.  Eyes:     Conjunctiva/sclera: Conjunctivae normal.     Pupils: Pupils are equal, round, and reactive to light.  Neck:     Vascular: No carotid bruit.  Cardiovascular:     Rate and Rhythm: Normal rate and regular rhythm.     Heart sounds: Normal heart sounds.  Pulmonary:     Effort: Pulmonary effort is normal.     Breath sounds: Normal breath sounds.  Abdominal:     Palpations: Abdomen is soft. There is no  pulsatile mass.     Tenderness: There is no abdominal tenderness.  Skin:    General: Skin is warm and dry.  Neurological:     Mental Status: She is alert and oriented to person, place, and time.  Psychiatric:        Behavior: Behavior normal.        Assessment & Plan:  [name] is a 70 y.o. female . Hyperlipidemia, unspecified hyperlipidemia type  Essential hypertension  Prediabetes  Tolerating current regimen, continue current dose of statin, HCTZ.  Has refills left from last visit  in December.  50-month follow-up for repeat labs. Recommend decreased soda/sweets. handout given on prediabetes.  Recheck labs next visit.   No orders of the defined types were placed in this encounter.  Patient Instructions    Try to decrease soda intake. recheck in 3 months for labs.   Take care.    Diabetes Care, 44(Suppl 1), F79-K24. https://doi.org/https://doi.org/10.2337/dc21-S003">  Prediabetes Prediabetes is when your blood sugar (blood glucose) level is higher than normal but not high enough for you to be diagnosed with type 2 diabetes. Having prediabetes puts you at risk for developing type 2 diabetes (type 2 diabetes mellitus). With certain lifestyle changes, you may be able to prevent or delay the onset of type 2 diabetes. This is important because type 2 diabetes can lead to serious complications, such as:  Heart disease.  Stroke.  Blindness.  Kidney disease.  Depression.  Poor circulation in the feet and legs. In severe cases, this could lead to surgical removal of a leg (amputation). What are the causes? The exact cause of prediabetes is not known. It may result from insulin resistance. Insulin resistance develops when cells in the body do not respond properly to insulin that the body makes. This can cause excess glucose to build up in the blood. High blood glucose (hyperglycemia) can develop. What increases the risk? The following factors may make you more likely  to develop this condition:  You have a family member with type 2 diabetes.  You are older than 45 years.  You had a temporary form of diabetes during a pregnancy (gestational diabetes).  You had polycystic ovary syndrome (PCOS).  You are overweight or obese.  You are inactive (sedentary).  You have a history of heart disease, including problems with cholesterol levels, high levels of blood fats, or high blood pressure. What are the signs or symptoms? You may have no symptoms. If you do have symptoms, they may include:  Increased hunger.  Increased thirst.  Increased urination.  Vision changes, such as blurry vision.  Tiredness (fatigue). How is this diagnosed? This condition can be diagnosed with blood tests. Your blood glucose may be checked with one or more of the following tests:  A fasting blood glucose (FBG) test. You will not be allowed to eat (you will fast) for at least 8 hours before a blood sample is taken.  An A1C blood test (hemoglobin A1C). This test provides information about blood glucose levels over the previous 2?3 months.  An oral glucose tolerance test (OGTT). This test measures your blood glucose at two points in time: ? After fasting. This is your baseline level. ? Two hours after you drink a beverage that contains glucose. You may be diagnosed with prediabetes if:  Your FBG is 100?125 mg/dL (5.6-6.9 mmol/L).  Your A1C level is 5.7?6.4% (39-46 mmol/mol).  Your OGTT result is 140?199 mg/dL (7.8-11 mmol/L). These blood tests may be repeated to confirm your diagnosis.   How is this treated? Treatment may include dietary and lifestyle changes to help lower your blood glucose and prevent type 2 diabetes from developing. In some cases, medicine may be prescribed to help lower the risk of type 2 diabetes. Follow these instructions at home: Nutrition  Follow a healthy meal plan. This includes eating lean proteins, whole grains, legumes, fresh fruits and  vegetables, low-fat dairy products, and healthy fats.  Follow instructions from your health care provider about eating or drinking restrictions.  Meet with a dietitian to create a healthy eating plan that  is right for you.   Lifestyle  Do moderate-intensity exercise for at least 30 minutes a day on 5 or more days each week, or as told by your health care provider. A mix of activities may be best, such as: ? Brisk walking, swimming, biking, and weight lifting.  Lose weight as told by your health care provider. Losing 5-7% of your body weight can reverse insulin resistance.  Do not drink alcohol if: ? Your health care provider tells you not to drink. ? You are pregnant, may be pregnant, or are planning to become pregnant.  If you drink alcohol: ? Limit how much you use to:  0-1 drink a day for women.  0-2 drinks a day for men. ? Be aware of how much alcohol is in your drink. In the [place] one drink equals one 12 oz bottle of beer (355 mL), one 5 oz glass of wine (148 mL), or one 1 oz glass of hard liquor (44 mL). General instructions  Take over-the-counter and prescription medicines only as told by your health care provider. You may be prescribed medicines that help lower the risk of type 2 diabetes.  Do not use any products that contain nicotine or tobacco, such as cigarettes, e-cigarettes, and chewing tobacco. If you need help quitting, ask your health care provider.  Keep all follow-up visits. This is important. Where to find more information  American Diabetes Association: www.diabetes.org  Academy of Nutrition and Dietetics: www.eatright.org  American Heart Association: www.heart.org Contact a health care provider if:  You have any of these symptoms: ? Increased hunger. ? Increased urination. ? Increased thirst. ? Fatigue. ? Vision changes, such as blurry vision. Get help right away if you:  Have shortness of breath.  Feel confused.  Vomit or feel like you may  vomit. Summary  Prediabetes is when your blood sugar (blood glucose)level is higher than normal but not high enough for you to be diagnosed with type 2 diabetes.  Having prediabetes puts you at risk for developing type 2 diabetes (type 2 diabetes mellitus).  Make lifestyle changes such as eating a healthy diet and exercising regularly to help prevent diabetes. Lose weight as told by your health care provider. This information is not intended to replace advice given to you by your health care provider. Make sure you discuss any questions you have with your health care provider. Document Revised: 04/27/2019 Document Reviewed: 04/27/2019 Elsevier Patient Education  [place] [employer].    If you have lab work done today you will be contacted with your lab results within the next 2 weeks.  If you have not heard from [place] then please contact us. The fastest way to get your results is to register for My Chart.   IF you received an x-ray today, you will receive an invoice from [facility]. Please contact [facility] at [phone] with questions or concerns regarding your invoice.   IF you received labwork today, you will receive an invoice from [place]. Please contact [facility] at [phone] with questions or concerns regarding your invoice.   Our billing staff will not be able to assist you with questions regarding bills from these companies.  You will be contacted with the lab results as soon as they are available. The fastest way to get your results is to activate your My Chart account. Instructions are located on the last page of this paperwork. If you have not heard from [place] regarding the results in 2 weeks, please contact this office.  Signed, Merri Ray, MD [facility]

## 2020-05-03 NOTE — Patient Instructions (Addendum)
Try to decrease soda intake. recheck in 3 months for labs.   Take care.    Diabetes Care, 44(Suppl 1), V25-D66. https://doi.org/https://doi.org/10.2337/dc21-S003">  Prediabetes Prediabetes is when your blood sugar (blood glucose) level is higher than normal but not high enough for you to be diagnosed with type 2 diabetes. Having prediabetes puts you at risk for developing type 2 diabetes (type 2 diabetes mellitus). With certain lifestyle changes, you may be able to prevent or delay the onset of type 2 diabetes. This is important because type 2 diabetes can lead to serious complications, such as:  Heart disease.  Stroke.  Blindness.  Kidney disease.  Depression.  Poor circulation in the feet and legs. In severe cases, this could lead to surgical removal of a leg (amputation). What are the causes? The exact cause of prediabetes is not known. It may result from insulin resistance. Insulin resistance develops when cells in the body do not respond properly to insulin that the body makes. This can cause excess glucose to build up in the blood. High blood glucose (hyperglycemia) can develop. What increases the risk? The following factors may make you more likely to develop this condition:  You have a family member with type 2 diabetes.  You are older than 45 years.  You had a temporary form of diabetes during a pregnancy (gestational diabetes).  You had polycystic ovary syndrome (PCOS).  You are overweight or obese.  You are inactive (sedentary).  You have a history of heart disease, including problems with cholesterol levels, high levels of blood fats, or high blood pressure. What are the signs or symptoms? You may have no symptoms. If you do have symptoms, they may include:  Increased hunger.  Increased thirst.  Increased urination.  Vision changes, such as blurry vision.  Tiredness (fatigue). How is this diagnosed? This condition can be diagnosed with blood tests.  Your blood glucose may be checked with one or more of the following tests:  A fasting blood glucose (FBG) test. You will not be allowed to eat (you will fast) for at least 8 hours before a blood sample is taken.  An A1C blood test (hemoglobin A1C). This test provides information about blood glucose levels over the previous 2?3 months.  An oral glucose tolerance test (OGTT). This test measures your blood glucose at two points in time: ? After fasting. This is your baseline level. ? Two hours after you drink a beverage that contains glucose. You may be diagnosed with prediabetes if:  Your FBG is 100?125 mg/dL (5.6-6.9 mmol/L).  Your A1C level is 5.7?6.4% (39-46 mmol/mol).  Your OGTT result is 140?199 mg/dL (7.8-11 mmol/L). These blood tests may be repeated to confirm your diagnosis.   How is this treated? Treatment may include dietary and lifestyle changes to help lower your blood glucose and prevent type 2 diabetes from developing. In some cases, medicine may be prescribed to help lower the risk of type 2 diabetes. Follow these instructions at home: Nutrition  Follow a healthy meal plan. This includes eating lean proteins, whole grains, legumes, fresh fruits and vegetables, low-fat dairy products, and healthy fats.  Follow instructions from your health care provider about eating or drinking restrictions.  Meet with a dietitian to create a healthy eating plan that is right for you.   Lifestyle  Do moderate-intensity exercise for at least 30 minutes a day on 5 or more days each week, or as told by your health care provider. A mix of activities may be best,  such as: ? Brisk walking, swimming, biking, and weight lifting.  Lose weight as told by your health care provider. Losing 5-7% of your body weight can reverse insulin resistance.  Do not drink alcohol if: ? Your health care provider tells you not to drink. ? You are pregnant, may be pregnant, or are planning to become  pregnant.  If you drink alcohol: ? Limit how much you use to:  0-1 drink a day for women.  0-2 drinks a day for men. ? Be aware of how much alcohol is in your drink. In the [place]S., one drink equals one 12 oz bottle of beer (355 mL), one 5 oz glass of wine (148 mL), or one 1 oz glass of hard liquor (44 mL). General instructions  Take over-the-counter and prescription medicines only as told by your health care provider. You may be prescribed medicines that help lower the risk of type 2 diabetes.  Do not use any products that contain nicotine or tobacco, such as cigarettes, e-cigarettes, and chewing tobacco. If you need help quitting, ask your health care provider.  Keep all follow-up visits. This is important. Where to find more information  American Diabetes Association: www.diabetes.org  Academy of Nutrition and Dietetics: www.eatright.org  American Heart Association: www.heart.org Contact a health care provider if:  You have any of these symptoms: ? Increased hunger. ? Increased urination. ? Increased thirst. ? Fatigue. ? Vision changes, such as blurry vision. Get help right away if you:  Have shortness of breath.  Feel confused.  Vomit or feel like you may vomit. Summary  Prediabetes is when your blood sugar (blood glucose)level is higher than normal but not high enough for you to be diagnosed with type 2 diabetes.  Having prediabetes puts you at risk for developing type 2 diabetes (type 2 diabetes mellitus).  Make lifestyle changes such as eating a healthy diet and exercising regularly to help prevent diabetes. Lose weight as told by your health care provider. This information is not intended to replace advice given to you by your health care provider. Make sure you discuss any questions you have with your health care provider. Document Revised: 04/27/2019 Document Reviewed: 04/27/2019 Elsevier Patient Education  [place].    If you have lab work done  today you will be contacted with your lab results within the next 2 weeks.  If you have not heard from [place] then please contact us. The fastest way to get your results is to register for My Chart.   IF you received an x-ray today, you will receive an invoice from [facility]. Please contact [facility] at [phone] with questions or concerns regarding your invoice.   IF you received labwork today, you will receive an invoice from [place]. Please contact [facility] at [phone] with questions or concerns regarding your invoice.   Our billing staff will not be able to assist you with questions regarding bills from these companies.  You will be contacted with the lab results as soon as they are available. The fastest way to get your results is to activate your My Chart account. Instructions are located on the last page of this paperwork. If you have not heard from [place] regarding the results in 2 weeks, please contact this office.

## 2020-05-14 ENCOUNTER — Telehealth (INDEPENDENT_AMBULATORY_CARE_PROVIDER_SITE_OTHER): Payer: Medicare HMO | Admitting: Registered Nurse

## 2020-05-14 ENCOUNTER — Other Ambulatory Visit: Payer: Self-pay

## 2020-05-14 DIAGNOSIS — J302 Other seasonal allergic rhinitis: Secondary | ICD-10-CM | POA: Diagnosis not present

## 2020-05-14 DIAGNOSIS — R051 Acute cough: Secondary | ICD-10-CM | POA: Diagnosis not present

## 2020-05-14 DIAGNOSIS — R059 Cough, unspecified: Secondary | ICD-10-CM

## 2020-05-14 MED ORDER — PREDNISONE 20 MG PO TABS: 20.0000 mg | ORAL_TABLET | Freq: Every day | ORAL | 0 refills | Status: DC

## 2020-05-14 MED ORDER — CETIRIZINE HCL 10 MG PO TABS: 10.0000 mg | ORAL_TABLET | Freq: Every day | ORAL | 11 refills | Status: DC

## 2020-05-14 MED ORDER — MONTELUKAST SODIUM 10 MG PO TABS: 10.0000 mg | ORAL_TABLET | Freq: Every day | ORAL | 3 refills | Status: DC

## 2020-05-17 ENCOUNTER — Telehealth: Payer: Self-pay | Admitting: Registered Nurse

## 2020-05-17 NOTE — Telephone Encounter (Signed)
Patient was in the office on 05/14/2020 for cough and said zyrtec was working but the cough is keeping her up at night.

## 2020-05-17 NOTE — Telephone Encounter (Signed)
Pt saw Richard on 05/14/20, she states that the allergy medication is helping some but she is still having a hard time sleeping at night due to the cough. She wanted to know if something could be sent in for her.   Please advise   She is aware that Delfino Lovett is not in the office today.

## 2020-05-23 DIAGNOSIS — M961 Postlaminectomy syndrome, not elsewhere classified: Secondary | ICD-10-CM | POA: Diagnosis not present

## 2020-05-23 DIAGNOSIS — M546 Pain in thoracic spine: Secondary | ICD-10-CM | POA: Diagnosis not present

## 2020-06-25 ENCOUNTER — Encounter: Payer: Self-pay | Admitting: Internal Medicine

## 2020-06-27 NOTE — Progress Notes (Signed)
Subjective:   [name] is a 70 y.o. female who presents for Medicare Annual (Subsequent) preventive examination.  I connected with Saree today by telephone and verified that I am speaking with the correct person using two identifiers. Location patient: home Location provider: work Persons participating in the virtual visit: patient, Marine scientist.    I discussed the limitations, risks, security and privacy concerns of performing an evaluation and management service by telephone and the availability of in person appointments. I also discussed with the patient that there may be a patient responsible charge related to this service. The patient expressed understanding and verbally consented to this telephonic visit.    Interactive audio and video telecommunications were attempted between this provider and patient, however failed, due to patient having technical difficulties OR patient did not have access to video capability.  We continued and completed visit with audio only.  Some vital signs may be absent or patient reported.   Time Spent with patient on telephone encounter: 25 minutes   Review of Systems     Cardiac Risk Factors include: advanced age (>2men, >11 women);hypertension;dyslipidemia     Objective:    Today's Vitals   07/01/20 1114  Weight: 168 lb (76.2 kg)  Height: 5\' 5"  (1.651 m)   Body mass index is 27.96 kg/m.  Advanced Directives 07/01/2020 06/20/2019 05/16/2018 02/01/2018 10/15/2016 01/30/2013 01/25/2013  Does Patient Have a Medical Advance Directive? No No No No No Patient does not have advance directive;Patient would not like information Patient does not have advance directive;Patient would not like information  Does patient want to make changes to medical advance directive? - No - Patient declined - - - - -  Would patient like information on creating a medical advance directive? No - Patient declined No - Patient declined No - Patient declined - No - Patient  declined - -  Pre-existing out of facility DNR order (yellow form or pink MOST form) - - - - - - No    Current Medications (verified) Outpatient Encounter Medications as of 07/01/2020  Medication Sig  . aspirin 81 MG tablet Take 81 mg by mouth daily.  . cetirizine (ZYRTEC) 10 MG tablet Take 1 tablet (10 mg total) by mouth daily.  Marland Kitchen gabapentin (NEURONTIN) 100 MG capsule Take 100 mg by mouth daily.  . hydrochlorothiazide (HYDRODIURIL) 25 MG tablet Take one daily by mouth  . HYDROcodone-acetaminophen (NORCO) 10-325 MG tablet Take 1 tablet by mouth every 8 (eight) hours as needed (pain).   . Misc. Devices [facility]) MISC 1 Device by Does not apply route as needed. Needed for low back pain, history of lumbar surgery, rollator insufficient for walking due to bursitis and back pain.  . montelukast (SINGULAIR) 10 MG tablet Take 1 tablet (10 mg total) by mouth at bedtime.  Marland Kitchen NAPROXEN 375 MG TBEC EC tablet TAKE 1 TABLET BY MOUTH 2 TIMES DAILY. TAKE 1000MG  TYLENOL EVERY 8 HOURS WITH THIS AND YOUR CHRONIC PAIN MEDS  . predniSONE (DELTASONE) 20 MG tablet Take 1 tablet (20 mg total) by mouth daily with breakfast.  . simvastatin (ZOCOR) 40 MG tablet TAKE 1 TABLET(40 MG) BY MOUTH DAILY   No facility-administered encounter medications on file as of 07/01/2020.    Allergies (verified) Patient has no known allergies.   History: Past Medical History:  Diagnosis Date  . Chronic bronchitis ([place])    "when I get a cold I normally get bronchitis" (01/30/2013)  . Hepatitis C   . Hyperlipidemia   .  Hypertension    Past Surgical History:  Procedure Laterality Date  . ABDOMINAL HYSTERECTOMY  1990's  . BACK SURGERY     x5  . CHOLECYSTECTOMY  1990's  . COLONOSCOPY    . JOINT REPLACEMENT Left 01/24/2013   hip  . LUMBAR SPINE SURGERY  2010; 2011   "tightened up some screws" (01/30/2013)  . POSTERIOR LUMBAR FUSION  2006 X 2; 2008  . 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 TUBAL LIGATION  1970's   Family History  Problem Relation Age of Onset  . Throat cancer Mother   . Breast cancer Sister   . Prostate cancer Brother    Social History   Socioeconomic History  . Marital status: Married    Spouse name: Not on file  . Number of children: Not on file  . Years of education: Not on file  . Highest education level: Not on file  Occupational History  . Occupation: retired  Tobacco Use  . Smoking status: Former Smoker    Packs/day: 1.00    Years: 45.00    Pack years: 45.00    Types: Cigarettes    Quit date: 05/25/2015    Years since quitting: 5.1  . Smokeless tobacco: Never Used  Vaping Use  . Vaping Use: Never used  Substance and Sexual Activity  . Alcohol use: No    Alcohol/week: 0.0 standard drinks    Comment: 01/30/2013 "used to drink beer; none since ~ 1989"  . Drug use: No  . Sexual activity: Yes    Birth control/protection: Post-menopausal  Other Topics Concern  . Not on file  Social History Narrative  . Not on file   Social Determinants of Health   Financial Resource Strain: Low Risk   . Difficulty of Paying Living Expenses: Not hard at all  Food Insecurity: No Food Insecurity  . Worried About Charity fundraiser in the Last Year: Never true  . Ran Out of Food in the Last Year: Never true  Transportation Needs: No Transportation Needs  . Lack of Transportation (Medical): No  . Lack of Transportation (Non-Medical): No  Physical Activity: Inactive  . Days of Exercise per Week: 0 days  . Minutes of Exercise per Session: 0 min  Stress: No Stress Concern Present  . Feeling of Stress : Not at all  Social Connections: Socially Integrated  . Frequency of Communication with Friends and Family: More than three times a week  . Frequency of Social Gatherings with Friends and Family: More than three times a  week  . Attends Religious Services: More than 4 times per year  . Active Member of Clubs or Organizations: Yes  . Attends Archivist Meetings: More than 4 times per year  . Marital Status: Married    Tobacco Counseling Counseling given: Not Answered   Clinical Intake:  Pre-visit preparation completed: Yes  Pain : No/denies pain     Nutritional Status: BMI 25 -29 Overweight Nutritional Risks: None Diabetes: No  How often do you need to have someone help you when you read instructions, pamphlets, or other written materials from your doctor or pharmacy?: 1 - Never  Diabetic?No  Interpreter Needed?: No  Information entered by :: Caroleen Hamman LPN   Activities of Daily Living In your present state of health, do you have any difficulty performing the following  activities: 07/01/2020  Hearing? N  Vision? N  Difficulty concentrating or making decisions? N  Walking or climbing stairs? N  Dressing or bathing? N  Doing errands, shopping? N  Preparing Food and eating ? N  Using the Toilet? N  In the past six months, have you accidently leaked urine? N  Do you have problems with loss of bowel control? N  Managing your Medications? N  Managing your Finances? N  Housekeeping or managing your Housekeeping? N  Some recent data might be hidden    Patient Care Team: Wendie Agreste, MD as PCP - General (Family Medicine) Webb Laws, [place] as Referring Physician (Optometry) Chyrl Civatte Thane Edu, PA-C as Physician Assistant (Neurosurgery)  Indicate any recent Medical Services you may have received from other than [facility] providers in the past year (date may be approximate).     Assessment:   This is a routine wellness examination for [name].  Hearing/Vision screen  Hearing Screening   125Hz  250Hz  500Hz  1000Hz  2000Hz  3000Hz  4000Hz  6000Hz  8000Hz   Right ear:           Left ear:           Comments: No issues  Vision Screening Comments: Wears  glasses Last eye exam-2021-Dr. McFarling  Dietary issues and exercise activities discussed: Current Exercise Habits: The patient does not participate in regular exercise at present, Exercise limited by: None identified  Goals Addressed            This Visit's Progress   . Increase water intake   Not on track    Patient will try to increase her water intake daily and decrease her soda intake.       Depression Screen PHQ 2/9 Scores 07/01/2020 05/03/2020 01/29/2020 06/20/2019 03/22/2019 03/10/2018 09/06/2017  PHQ - 2 Score 0 0 0 0 0 0 0    Fall Risk Fall Risk  07/01/2020 05/14/2020 05/03/2020 01/29/2020 06/20/2019  Falls in the past year? 0 0 0 0 0  Number falls in past yr: 0 - - 0 0  Injury with Fall? 0 - - 0 0  Follow up Falls prevention discussed Falls evaluation completed Falls evaluation completed Falls evaluation completed Falls evaluation completed;Education provided    FALL RISK PREVENTION PERTAINING TO THE HOME:  Any stairs in or around the home? No  Home free of loose throw rugs in walkways, pet beds, electrical cords, etc? Yes  Adequate lighting in your home to reduce risk of falls? Yes   ASSISTIVE DEVICES UTILIZED TO PREVENT FALLS:  Life alert? No  Use of a cane, walker or w/c? No  Grab bars in the bathroom? Yes  Shower chair or bench in shower? No  Elevated toilet seat or a handicapped toilet? No   TIMED UP AND GO:  Was the test performed? No . Phone visit   Cognitive Function:Normal cognitive status assessed by this Nurse Health Advisor. No abnormalities found.       6CIT Screen 06/20/2019 05/16/2018 10/15/2016  What Year? 0 points 0 points 0 points  What month? 0 points 0 points 0 points  What time? 0 points 0 points 0 points  Count back from 20 0 points 0 points 0 points  Months in reverse 2 points 0 points 0 points  Repeat phrase 2 points 0 points 0 points  Total Score 4 0 0    Immunizations Immunization History  Administered Date(s) Administered  .  Influenza Split 11/10/2011, 10/25/2014  . Influenza,inj,Quad PF,6+ Mos 12/06/2012, 11/10/2014, 10/15/2016  .  Influenza-Unspecified 10/10/2013, 11/05/2015, 10/19/2017  . Moderna Sars-Covid-2 Vaccination 03/25/2019, 04/22/2019  . PFIZER(Purple Top)SARS-COV-2 Vaccination 12/05/2019  . Pneumococcal Conjugate-13 01/18/2015, 10/19/2017  . Pneumococcal Polysaccharide-23 10/24/2018  . Pneumococcal-Unspecified 12/15/2004  . Tdap 09/02/2006, 11/05/2015  . Zoster 11/25/2012  . Zoster Recombinat (Shingrix) 08/06/2016, 10/17/2016    TDAP status: Up to date  Flu Vaccine status: Up to date  Pneumococcal vaccine status: Up to date  Covid-19 vaccine status: Completed vaccines  Qualifies for Shingles Vaccine? No   Zostavax completed Yes   Shingrix Completed?: Yes  Screening Tests Health Maintenance  Topic Date Due  . COLONOSCOPY (Pts 45-14yrs Insurance coverage will need to be confirmed)  07/01/2020  . INFLUENZA VACCINE  09/09/2020  . MAMMOGRAM  10/01/2021  . TETANUS/TDAP  11/04/2025  . DEXA SCAN  Completed  . COVID-19 Vaccine  Completed  . Hepatitis C Screening  Completed  . PNA vac Low Risk Adult  Completed  . HPV VACCINES  Aged Out    Health Maintenance  Health Maintenance Due  Topic Date Due  . COLONOSCOPY (Pts 45-49yrs Insurance coverage will need to be confirmed)  07/01/2020    Colorectal cancer screening: Type of screening: Colonoscopy. Completed 07/02/2015. Repeat every 5 years Patient plans to call Gi to schedule next colonoscopy  Mammogram status: Completed Bilateral 10/02/2019. Repeat every year  Bone Density status: Due-Declined today  Lung Cancer Screening: (Low Dose CT Chest recommended if Age 55-80 years, 30 pack-year currently smoking OR have quit w/in 15years.) does qualify.   Lung Cancer Screening Referral: Declined-Patient states she will discuss with PCP at next office visit.  Additional Screening:  Hepatitis C Screening: does not qualify  Vision Screening:  Recommended annual ophthalmology exams for early detection of glaucoma and other disorders of the eye. Is the patient up to date with their annual eye exam?  Yes  Who is the provider or what is the name of the office in which the patient attends annual eye exams? Dr. Angelina Pih   Dental Screening: Recommended annual dental exams for proper oral hygiene  Community Resource Referral / Chronic Care Management: CRR required this visit?  No   CCM required this visit?  No      Plan:     I have personally reviewed and noted the following in the patient's chart:   . Medical and social history . Use of alcohol, tobacco or illicit drugs  . Current medications and supplements including opioid prescriptions.  . Functional ability and status . Nutritional status . Physical activity . Advanced directives . List of other physicians . Hospitalizations, surgeries, and ER visits in previous 12 months . Vitals . Screenings to include cognitive, depression, and falls . Referrals and appointments  In addition, I have reviewed and discussed with patient certain preventive protocols, quality metrics, and best practice recommendations. A written personalized care plan for preventive services as well as general preventive health recommendations were provided to patient.   Due to this being a telephonic visit, the after visit summary with patients personalized plan was offered to patient via mail or my-chart. Patient would like to access on my-chart.    Marta Antu, LPN   [id number]  Nurse Health Advisor  Nurse Notes: None

## 2020-07-01 ENCOUNTER — Ambulatory Visit (INDEPENDENT_AMBULATORY_CARE_PROVIDER_SITE_OTHER): Payer: Medicare HMO

## 2020-07-01 VITALS — Ht 65.0 in | Wt 168.0 lb

## 2020-07-01 DIAGNOSIS — Z0000 Encounter for general adult medical examination without abnormal findings: Secondary | ICD-10-CM

## 2020-07-01 NOTE — Patient Instructions (Signed)
[name] , Thank you for taking time to complete your Medicare Wellness Visit. I appreciate your ongoing commitment to your health goals. Please review the following plan we discussed and let me know if I can assist you in the future.   Screening recommendations/referrals: Colonoscopy: Due-Per our conversation today, you will gall GI to schedule. Mammogram: Completed 10/02/2019-Due 10/01/2020 Bone Density: Due-Declined today-May complete with next mammogram. Recommended yearly ophthalmology/optometry visit for glaucoma screening and checkup Recommended yearly dental visit for hygiene and checkup  Vaccinations: Influenza vaccine: Up to date Pneumococcal vaccine: Completed vaccines Tdap vaccine: Up to date-Due-11/04/2025 Shingles vaccine: Completed vaccines   Covid-19:Up to date  Advanced directives: Declined information today  Conditions/risks identified: See problem list  Next appointment: Follow up in one year for your annual wellness visit 07/15/2021 @ 11:15   Preventive Care 69 Years and Older, Female Preventive care refers to lifestyle choices and visits with your health care provider that can promote health and wellness. What does preventive care include?  A yearly physical exam. This is also called an annual well check.  Dental exams once or twice a year.  Routine eye exams. Ask your health care provider how often you should have your eyes checked.  Personal lifestyle choices, including:  Daily care of your teeth and gums.  Regular physical activity.  Eating a healthy diet.  Avoiding tobacco and drug use.  Limiting alcohol use.  Practicing safe sex.  Taking low-dose aspirin every day.  Taking vitamin and mineral supplements as recommended by your health care provider. What happens during an annual well check? The services and screenings done by your health care provider during your annual well check will depend on your age, overall health, lifestyle risk  factors, and family history of disease. Counseling  Your health care provider may ask you questions about your:  Alcohol use.  Tobacco use.  Drug use.  Emotional well-being.  Home and relationship well-being.  Sexual activity.  Eating habits.  History of falls.  Memory and ability to understand (cognition).  Work and work Statistician.  Reproductive health. Screening  You may have the following tests or measurements:  Height, weight, and BMI.  Blood pressure.  Lipid and cholesterol levels. These may be checked every 5 years, or more frequently if you are over 57 years old.  Skin check.  Lung cancer screening. You may have this screening every year starting at age 8 if you have a 30-pack-year history of smoking and currently smoke or have quit within the past 15 years.  Fecal occult blood test (FOBT) of the stool. You may have this test every year starting at age 61.  Flexible sigmoidoscopy or colonoscopy. You may have a sigmoidoscopy every 5 years or a colonoscopy every 10 years starting at age 48.  Hepatitis C blood test.  Hepatitis B blood test.  Sexually transmitted disease (STD) testing.  Diabetes screening. This is done by checking your blood sugar (glucose) after you have not eaten for a while (fasting). You may have this done every 1-3 years.  Bone density scan. This is done to screen for osteoporosis. You may have this done starting at age 55.  Mammogram. This may be done every 1-2 years. Talk to your health care provider about how often you should have regular mammograms. Talk with your health care provider about your test results, treatment options, and if necessary, the need for more tests. Vaccines  Your health care provider may recommend certain vaccines, such as:  Influenza vaccine.  This is recommended every year.  Tetanus, diphtheria, and acellular pertussis (Tdap, Td) vaccine. You may need a Td booster every 10 years.  Zoster vaccine. You  may need this after age 55.  Pneumococcal 13-valent conjugate (PCV13) vaccine. One dose is recommended after age 54.  Pneumococcal polysaccharide (PPSV23) vaccine. One dose is recommended after age 79. Talk to your health care provider about which screenings and vaccines you need and how often you need them. This information is not intended to replace advice given to you by your health care provider. Make sure you discuss any questions you have with your health care provider. Document Released: 02/22/2015 Document Revised: 10/16/2015 Document Reviewed: 11/27/2014 Elsevier Interactive Patient Education  2017 [employer] Prevention in the Home Falls can cause injuries. They can happen to people of all ages. There are many things you can do to make your home safe and to help prevent falls. What can I do on the outside of my home?  Regularly fix the edges of walkways and driveways and fix any cracks.  Remove anything that might make you trip as you walk through a door, such as a raised step or threshold.  Trim any bushes or trees on the path to your home.  Use bright outdoor lighting.  Clear any walking paths of anything that might make someone trip, such as rocks or tools.  Regularly check to see if handrails are loose or broken. Make sure that both sides of any steps have handrails.  Any raised decks and porches should have guardrails on the edges.  Have any leaves, snow, or ice cleared regularly.  Use sand or salt on walking paths during winter.  Clean up any spills in your garage right away. This includes oil or grease spills. What can I do in the bathroom?  Use night lights.  Install grab bars by the toilet and in the tub and shower. Do not use towel bars as grab bars.  Use non-skid mats or decals in the tub or shower.  If you need to sit down in the shower, use a plastic, non-slip stool.  Keep the floor dry. Clean up any water that spills on the floor as soon as  it happens.  Remove soap buildup in the tub or shower regularly.  Attach bath mats securely with double-sided non-slip rug tape.  Do not have throw rugs and other things on the floor that can make you trip. What can I do in the bedroom?  Use night lights.  Make sure that you have a light by your bed that is easy to reach.  Do not use any sheets or blankets that are too big for your bed. They should not hang down onto the floor.  Have a firm chair that has side arms. You can use this for support while you get dressed.  Do not have throw rugs and other things on the floor that can make you trip. What can I do in the kitchen?  Clean up any spills right away.  Avoid walking on wet floors.  Keep items that you use a lot in easy-to-reach places.  If you need to reach something above you, use a strong step stool that has a grab bar.  Keep electrical cords out of the way.  Do not use floor polish or wax that makes floors slippery. If you must use wax, use non-skid floor wax.  Do not have throw rugs and other things on the floor that can make  you trip. What can I do with my stairs?  Do not leave any items on the stairs.  Make sure that there are handrails on both sides of the stairs and use them. Fix handrails that are broken or loose. Make sure that handrails are as long as the stairways.  Check any carpeting to make sure that it is firmly attached to the stairs. Fix any carpet that is loose or worn.  Avoid having throw rugs at the top or bottom of the stairs. If you do have throw rugs, attach them to the floor with carpet tape.  Make sure that you have a light switch at the top of the stairs and the bottom of the stairs. If you do not have them, ask someone to add them for you. What else can I do to help prevent falls?  Wear shoes that:  Do not have high heels.  Have rubber bottoms.  Are comfortable and fit you well.  Are closed at the toe. Do not wear sandals.  If  you use a stepladder:  Make sure that it is fully opened. Do not climb a closed stepladder.  Make sure that both sides of the stepladder are locked into place.  Ask someone to hold it for you, if possible.  Clearly mark and make sure that you can see:  Any grab bars or handrails.  First and last steps.  Where the edge of each step is.  Use tools that help you move around (mobility aids) if they are needed. These include:  Canes.  Walkers.  Scooters.  Crutches.  Turn on the lights when you go into a dark area. Replace any light bulbs as soon as they burn out.  Set up your furniture so you have a clear path. Avoid moving your furniture around.  If any of your floors are uneven, fix them.  If there are any pets around you, be aware of where they are.  Review your medicines with your doctor. Some medicines can make you feel dizzy. This can increase your chance of falling. Ask your doctor what other things that you can do to help prevent falls. This information is not intended to replace advice given to you by your health care provider. Make sure you discuss any questions you have with your health care provider. Document Released: 11/22/2008 Document Revised: 07/04/2015 Document Reviewed: 03/02/2014 Elsevier Interactive Patient Education  2017 [employer].

## 2020-08-19 DIAGNOSIS — M5416 Radiculopathy, lumbar region: Secondary | ICD-10-CM | POA: Diagnosis not present

## 2020-08-19 DIAGNOSIS — M546 Pain in thoracic spine: Secondary | ICD-10-CM | POA: Diagnosis not present

## 2020-08-19 DIAGNOSIS — M961 Postlaminectomy syndrome, not elsewhere classified: Secondary | ICD-10-CM | POA: Diagnosis not present

## 2020-09-12 DIAGNOSIS — M5415 Radiculopathy, thoracolumbar region: Secondary | ICD-10-CM | POA: Diagnosis not present

## 2020-09-19 ENCOUNTER — Other Ambulatory Visit: Payer: Self-pay | Admitting: Family Medicine

## 2020-09-19 ENCOUNTER — Other Ambulatory Visit: Payer: Self-pay | Admitting: Critical Care Medicine

## 2020-09-19 DIAGNOSIS — Z1231 Encounter for screening mammogram for malignant neoplasm of breast: Secondary | ICD-10-CM

## 2020-10-02 ENCOUNTER — Ambulatory Visit
Admission: RE | Admit: 2020-10-02 | Discharge: 2020-10-02 | Disposition: A | Discharge status: Home / Self Care | Payer: Medicare HMO | Source: Ambulatory Visit

## 2020-10-02 ENCOUNTER — Other Ambulatory Visit: Payer: Self-pay

## 2020-10-02 DIAGNOSIS — Z1231 Encounter for screening mammogram for malignant neoplasm of breast: Secondary | ICD-10-CM | POA: Diagnosis not present

## 2020-10-07 ENCOUNTER — Other Ambulatory Visit: Payer: Self-pay | Admitting: Family Medicine

## 2020-10-07 DIAGNOSIS — R928 Other abnormal and inconclusive findings on diagnostic imaging of breast: Secondary | ICD-10-CM

## 2020-10-24 ENCOUNTER — Ambulatory Visit
Admission: RE | Admit: 2020-10-24 | Discharge: 2020-10-24 | Disposition: A | Discharge status: Home / Self Care | Payer: Medicare HMO | Source: Ambulatory Visit | Attending: Family Medicine | Admitting: Family Medicine

## 2020-10-24 ENCOUNTER — Other Ambulatory Visit: Payer: Self-pay

## 2020-10-24 ENCOUNTER — Ambulatory Visit: Payer: Medicare HMO

## 2020-10-24 DIAGNOSIS — R922 Inconclusive mammogram: Secondary | ICD-10-CM | POA: Diagnosis not present

## 2020-10-24 DIAGNOSIS — R928 Other abnormal and inconclusive findings on diagnostic imaging of breast: Secondary | ICD-10-CM | POA: Diagnosis not present

## 2020-11-11 ENCOUNTER — Other Ambulatory Visit: Payer: Self-pay | Admitting: Family Medicine

## 2020-11-11 DIAGNOSIS — I10 Essential (primary) hypertension: Secondary | ICD-10-CM

## 2020-11-30 NOTE — Progress Notes (Signed)
Acute Office Visit  Subjective:    Patient ID: [name], female    DOB: Apr 02, 1950, 70 y.o.   MRN: [id number]  Chief Complaint  Patient presents with   Cough    Pt has had a cough, mostly at night some sinus drainage, notes a dry cough, started about 2 weeks ago, denies fever chills, body aches.     HPI Patient is in today for cough  Ongoing. 2 weeks. Mostly at night No chills, fever, aches, pains. Sometimes some sinus pressure and pnd, but otherwise no symptoms of illness. Denies nvd, chest pain, headache, or visual changes.  Past Medical History:  Diagnosis Date   Chronic bronchitis ([place])    "when I get a cold I normally get bronchitis" (01/30/2013)   Hepatitis C    Hyperlipidemia    Hypertension     Past Surgical History:  Procedure Laterality Date   ABDOMINAL HYSTERECTOMY  1990's   BACK SURGERY     x5   CHOLECYSTECTOMY  1990's   COLONOSCOPY     JOINT REPLACEMENT Left 01/24/2013   hip   LUMBAR SPINE SURGERY  2010; 2011   "tightened up some screws" (01/30/2013)   POSTERIOR LUMBAR FUSION  2006 X 2; 2008   TOTAL HIP ARTHROPLASTY Left 01/24/2013   Procedure: LEFT TOTAL HIP ARTHROPLASTY ANTERIOR APPROACH;  Surgeon: Mcarthur Rossetti, MD;  Location: [facility];  Service: Orthopedics;  Laterality: Left;   TUBAL LIGATION  1970's    Family History  Problem Relation Age of Onset   Throat cancer Mother    Breast cancer Sister    Prostate cancer Brother     Social History   Socioeconomic History   Marital status: Married    Spouse name: Not on file   Number of children: Not on file   Years of education: Not on file   Highest education level: Not on file  Occupational History   Occupation: retired  Tobacco Use   Smoking status: Former    Packs/day: 1.00    Years: 45.00    Pack years: 45.00    Types: Cigarettes    Quit date: 05/25/2015    Years since quitting: 5.5   Smokeless tobacco: Never  Vaping Use   Vaping Use: Never used  Substance and  Sexual Activity   Alcohol use: No    Alcohol/week: 0.0 standard drinks    Comment: 01/30/2013 "used to drink beer; none since ~ 1989"   Drug use: No   Sexual activity: Yes    Birth control/protection: Post-menopausal  Other Topics Concern   Not on file  Social History Narrative   Not on file   Social Determinants of Health   Financial Resource Strain: Low Risk    Difficulty of Paying Living Expenses: Not hard at all  Food Insecurity: No Food Insecurity   Worried About Charity fundraiser in the Last Year: Never true   [place] in the Last Year: Never true  Transportation Needs: No Transportation Needs   Lack of Transportation (Medical): No   Lack of Transportation (Non-Medical): No  Physical Activity: Inactive   Days of Exercise per Week: 0 days   Minutes of Exercise per Session: 0 min  Stress: No Stress Concern Present   Feeling of Stress : Not at all  Social Connections: Socially Integrated   Frequency of Communication with Friends and Family: More than three times a week   Frequency of Social Gatherings with Friends and Family: More  than three times a week   Attends Religious Services: More than 4 times per year   Active Member of Clubs or Organizations: Yes   Attends Music therapist: More than 4 times per year   Marital Status: Married  Human resources officer Violence: Not At Risk   Fear of Current or Ex-Partner: No   Emotionally Abused: No   Physically Abused: No   Sexually Abused: No    Outpatient Medications Prior to Visit  Medication Sig Dispense Refill   aspirin 81 MG tablet Take 81 mg by mouth daily.     gabapentin (NEURONTIN) 100 MG capsule Take 100 mg by mouth daily.     HYDROcodone-acetaminophen (NORCO) 10-325 MG tablet Take 1 tablet by mouth every 8 (eight) hours as needed (pain).      Misc. Devices [facility]) MISC 1 Device by Does not apply route as needed. Needed for low back pain, history of lumbar surgery, rollator insufficient for  walking due to bursitis and back pain. 1 each 0   NAPROXEN 375 MG TBEC EC tablet TAKE 1 TABLET BY MOUTH 2 TIMES DAILY. TAKE 1000MG  TYLENOL EVERY 8 HOURS WITH THIS AND YOUR CHRONIC PAIN MEDS 180 tablet 0   simvastatin (ZOCOR) 40 MG tablet TAKE 1 TABLET(40 MG) BY MOUTH DAILY 90 tablet 2   hydrochlorothiazide (HYDRODIURIL) 25 MG tablet Take one daily by mouth 90 tablet 2   No facility-administered medications prior to visit.    No Known Allergies  Review of Systems Per hpi      Objective:    Physical Exam Vitals and nursing note reviewed.  Constitutional:      General: She is not in acute distress.    Appearance: Normal appearance. She is not ill-appearing, toxic-appearing or diaphoretic.  Cardiovascular:     Rate and Rhythm: Normal rate and regular rhythm.     Pulses: Normal pulses.     Heart sounds: Normal heart sounds. No murmur heard.   No friction rub. No gallop.  Pulmonary:     Effort: Pulmonary effort is normal. No respiratory distress.     Breath sounds: Normal breath sounds. No stridor. No wheezing, rhonchi or rales.  Chest:     Chest wall: No tenderness.  Skin:    General: Skin is warm and dry.     Capillary Refill: Capillary refill takes less than 2 seconds.  Neurological:     General: No focal deficit present.     Mental Status: She is alert and oriented to person, place, and time. Mental status is at baseline.  Psychiatric:        Mood and Affect: Mood normal.        Behavior: Behavior normal.        Thought Content: Thought content normal.        Judgment: Judgment normal.    There were no vitals taken for this visit. Wt Readings from Last 3 Encounters:  07/01/20 168 lb (76.2 kg)  05/03/20 168 lb (76.2 kg)  01/29/20 163 lb (73.9 kg)    Health Maintenance Due  Topic Date Due   COVID-19 Vaccine (4 - Booster) 01/30/2020   COLONOSCOPY (Pts 45-74yrs Insurance coverage will need to be confirmed)  07/01/2020   INFLUENZA VACCINE  09/09/2020    There are no  preventive care reminders to display for this patient.   Lab Results  Component Value Date   TSH 0.679 01/29/2020   Lab Results  Component Value Date   WBC 7.0 01/29/2020  HGB 13.2 01/29/2020   HCT 38.7 01/29/2020   MCV 89 01/29/2020   PLT 272 01/29/2020   Lab Results  Component Value Date   NA 141 01/29/2020   K 4.1 01/29/2020   CO2 24 01/29/2020   GLUCOSE 100 (H) 01/29/2020   BUN 15 01/29/2020   CREATININE 0.75 01/29/2020   BILITOT 0.5 01/29/2020   ALKPHOS 104 01/29/2020   AST 33 01/29/2020   ALT 27 01/29/2020   PROT 7.9 01/29/2020   ALBUMIN 4.8 01/29/2020   CALCIUM 10.1 01/29/2020   ANIONGAP 12 02/01/2018   Lab Results  Component Value Date   CHOL 139 01/29/2020   Lab Results  Component Value Date   HDL 62 01/29/2020   Lab Results  Component Value Date   LDLCALC 66 01/29/2020   Lab Results  Component Value Date   TRIG 47 01/29/2020   Lab Results  Component Value Date   CHOLHDL 2.2 01/29/2020   Lab Results  Component Value Date   HGBA1C 6.2 (H) 01/29/2020       Assessment & Plan:   Problem List Items Addressed This Visit   None Visit Diagnoses     Cough    -  Primary   Relevant Medications   cetirizine (ZYRTEC) 10 MG tablet   montelukast (SINGULAIR) 10 MG tablet   predniSONE (DELTASONE) 20 MG tablet   Seasonal allergies       Relevant Medications   cetirizine (ZYRTEC) 10 MG tablet   montelukast (SINGULAIR) 10 MG tablet   predniSONE (DELTASONE) 20 MG tablet        Meds ordered this encounter  Medications   cetirizine (ZYRTEC) 10 MG tablet    Sig: Take 1 tablet (10 mg total) by mouth daily.    Dispense:  30 tablet    Refill:  11    Order Specific Question:   Supervising Provider    Answer:   Carlota Raspberry, JEFFREY R [[id number]]   montelukast (SINGULAIR) 10 MG tablet    Sig: Take 1 tablet (10 mg total) by mouth at bedtime.    Dispense:  30 tablet    Refill:  3    Order Specific Question:   Supervising Provider    Answer:   Carlota Raspberry,  JEFFREY R [[id number]]   predniSONE (DELTASONE) 20 MG tablet    Sig: Take 1 tablet (20 mg total) by mouth daily with breakfast.    Dispense:  5 tablet    Refill:  0    Order Specific Question:   Supervising Provider    Answer:   Carlota Raspberry, JEFFREY R [[id number]]   PLAN Given limited symptoms and persistence of cough, will give zyrtec and singulair along with prednisone burst. Reviewed risks of medications. Pt voices understanding. Return if worsening or failing to improve. Low threshold for imaging  Patient encouraged to call clinic with any questions, comments, or concerns.   Maximiano Coss, NP

## 2020-12-02 NOTE — Telephone Encounter (Signed)
This concern has been previously addressed by myself and/or another provider.  If they patient has ongoing concerns, they can contact me at their convenience.  Thank you,  Rich Prospero Mahnke, NP 

## 2021-01-20 ENCOUNTER — Other Ambulatory Visit: Payer: Self-pay

## 2021-01-20 ENCOUNTER — Telehealth: Payer: Self-pay | Admitting: Family Medicine

## 2021-01-20 DIAGNOSIS — E785 Hyperlipidemia, unspecified: Secondary | ICD-10-CM

## 2021-01-20 DIAGNOSIS — I10 Essential (primary) hypertension: Secondary | ICD-10-CM

## 2021-01-20 MED ORDER — SIMVASTATIN 40 MG PO TABS: ORAL_TABLET | ORAL | 2 refills | Status: DC

## 2021-01-20 MED ORDER — HYDROCHLOROTHIAZIDE 25 MG PO TABS: ORAL_TABLET | ORAL | 0 refills | Status: DC

## 2021-01-20 NOTE — Telephone Encounter (Signed)
Rx sent to pharmacy, patient aware

## 2021-01-20 NOTE — Telephone Encounter (Signed)
..  Caller name:  Caller callback #:  Encourage patient to contact the pharmacy for refills or they can request refills through [facility]  (Please schedule appointment if patient has not been seen in over a year)  MEDICATION NAME & DOSE: Hydrochlorathiazide & simvastatin  Notes/Comments from patient:  WHAT [place]: [facility]   Please notify patient: It takes 48-72 hours to process rx refill requests Ask patient to call pharmacy to ensure rx is ready before heading there.   (CLINICAL TO FILL OR ROUTE PER PROTOCOLS)

## 2021-01-24 ENCOUNTER — Other Ambulatory Visit: Payer: Self-pay | Admitting: Family Medicine

## 2021-01-24 DIAGNOSIS — I10 Essential (primary) hypertension: Secondary | ICD-10-CM

## 2021-01-27 DIAGNOSIS — M546 Pain in thoracic spine: Secondary | ICD-10-CM | POA: Diagnosis not present

## 2021-01-27 DIAGNOSIS — M961 Postlaminectomy syndrome, not elsewhere classified: Secondary | ICD-10-CM | POA: Diagnosis not present

## 2021-01-27 DIAGNOSIS — M5416 Radiculopathy, lumbar region: Secondary | ICD-10-CM | POA: Diagnosis not present

## 2021-01-27 DIAGNOSIS — M62838 Other muscle spasm: Secondary | ICD-10-CM | POA: Diagnosis not present

## 2021-03-10 DIAGNOSIS — M5414 Radiculopathy, thoracic region: Secondary | ICD-10-CM | POA: Diagnosis not present

## 2021-04-14 DIAGNOSIS — M546 Pain in thoracic spine: Secondary | ICD-10-CM | POA: Diagnosis not present

## 2021-04-14 DIAGNOSIS — M5416 Radiculopathy, lumbar region: Secondary | ICD-10-CM | POA: Diagnosis not present

## 2021-04-14 DIAGNOSIS — M62838 Other muscle spasm: Secondary | ICD-10-CM | POA: Diagnosis not present

## 2021-04-14 DIAGNOSIS — M961 Postlaminectomy syndrome, not elsewhere classified: Secondary | ICD-10-CM | POA: Diagnosis not present

## 2021-04-19 ENCOUNTER — Other Ambulatory Visit: Payer: Self-pay | Admitting: Family Medicine

## 2021-04-19 DIAGNOSIS — I10 Essential (primary) hypertension: Secondary | ICD-10-CM

## 2021-05-19 ENCOUNTER — Other Ambulatory Visit: Payer: Self-pay

## 2021-05-19 ENCOUNTER — Other Ambulatory Visit: Payer: Self-pay | Admitting: Family Medicine

## 2021-05-19 DIAGNOSIS — I10 Essential (primary) hypertension: Secondary | ICD-10-CM

## 2021-05-19 MED ORDER — HYDROCHLOROTHIAZIDE 25 MG PO TABS: ORAL_TABLET | ORAL | 0 refills | Status: DC

## 2021-05-26 ENCOUNTER — Encounter: Payer: Self-pay | Admitting: Family Medicine

## 2021-05-26 ENCOUNTER — Ambulatory Visit (INDEPENDENT_AMBULATORY_CARE_PROVIDER_SITE_OTHER): Payer: Medicare HMO | Admitting: Family Medicine

## 2021-05-26 VITALS — BP 136/74 | HR 78 | Temp 97.8°F | Resp 15 | Ht 65.0 in | Wt 163.0 lb

## 2021-05-26 DIAGNOSIS — E785 Hyperlipidemia, unspecified: Secondary | ICD-10-CM | POA: Diagnosis not present

## 2021-05-26 DIAGNOSIS — I10 Essential (primary) hypertension: Secondary | ICD-10-CM | POA: Diagnosis not present

## 2021-05-26 DIAGNOSIS — Z1211 Encounter for screening for malignant neoplasm of colon: Secondary | ICD-10-CM | POA: Diagnosis not present

## 2021-05-26 DIAGNOSIS — R7303 Prediabetes: Secondary | ICD-10-CM | POA: Diagnosis not present

## 2021-05-26 DIAGNOSIS — M542 Cervicalgia: Secondary | ICD-10-CM

## 2021-05-26 LAB — COMPREHENSIVE METABOLIC PANEL
ALT: 39 U/L — ABNORMAL HIGH (ref 0–35)
AST: 41 U/L — ABNORMAL HIGH (ref 0–37)
Albumin: 4.8 g/dL (ref 3.5–5.2)
Alkaline Phosphatase: 79 U/L (ref 39–117)
BUN: 19 mg/dL (ref 6–23)
CO2: 29 mEq/L (ref 19–32)
Calcium: 10.2 mg/dL (ref 8.4–10.5)
Chloride: 103 mEq/L (ref 96–112)
Creatinine, Ser: 0.74 mg/dL (ref 0.40–1.20)
GFR: 82.23 mL/min (ref >60.00)
Glucose, Bld: 90 mg/dL (ref 70–99)
Potassium: 3.7 mEq/L (ref 3.5–5.1)
Sodium: 141 mEq/L (ref 135–145)
Total Bilirubin: 0.4 mg/dL (ref 0.2–1.2)
Total Protein: 7.9 g/dL (ref 6.0–8.3)

## 2021-05-26 LAB — LIPID PANEL
Cholesterol: 149 mg/dL (ref 0–200)
HDL: 62.6 mg/dL (ref >39.00)
LDL Cholesterol: 71 mg/dL (ref 0–99)
NonHDL: 85.98
Total CHOL/HDL Ratio: 2
Triglycerides: 74 mg/dL (ref 0.0–149.0)
VLDL: 14.8 mg/dL (ref 0.0–40.0)

## 2021-05-26 LAB — HEMOGLOBIN A1C: Hgb A1c MFr Bld: 6.3 % (ref 4.6–6.5)

## 2021-05-26 MED ORDER — HYDROCHLOROTHIAZIDE 25 MG PO TABS: ORAL_TABLET | ORAL | 1 refills | Status: DC

## 2021-05-26 MED ORDER — SIMVASTATIN 40 MG PO TABS: ORAL_TABLET | ORAL | 2 refills | Status: DC

## 2021-05-26 NOTE — Progress Notes (Signed)
? ?Subjective:  ?Patient ID: [name], female    DOB: 1950-05-29  Age: 70 y.o. MRN: [id number] ? ?CC:  ?Chief Complaint  ?Patient presents with  ? Hyperlipidemia  ?  Due for recheck on labs   ? Hypertension  ?  Pt notes not no concerns due for refill   ? ? ?HPI ?[name] presents for  ? ?Hypertension: ?HCTZ 25 mg daily.no new side effects.  ?Home readings:none.  ?BP Readings from Last 3 Encounters:  ?05/26/21 136/74  ?05/03/20 129/81  ?01/29/20 120/73  ? ?Lab Results  ?Component Value Date  ? CREATININE 0.75 01/29/2020  ? ?L neck pain: ?Pain off and on for some time - past year. In past 1-2 month -pain moves down shoulder, upper arm and to hand at times. Swelling in left hand at times past few weeks. Pain of and on. Followed by pain mgt. Has not discussed this pain with them. No prior neck surgery, just back surgery.  ?Neuro: Dr Saintclair Halsted for back issues.  ?Last cspine imaging in 2016: ?IMPRESSION: ?No acute findings.  Mild lower cervical degenerative disc disease. ?  ? ?Prediabetes: ?Discussed avoidance of sodas in the past.  Weight down 5 pounds from May of last year.  ?Has cut back on sodas. Remaining active with volunteer work.  ?Lab Results  ?Component Value Date  ? HGBA1C 6.2 (H) 01/29/2020  ? ?Wt Readings from Last 3 Encounters:  ?05/26/21 163 lb (73.9 kg)  ?07/01/20 168 lb (76.2 kg)  ?05/03/20 168 lb (76.2 kg)  ? ?Health maintenance ?Colonoscopy due.  Last colonoscopy May 2017.Dr. Henrene Pastor repeat 3 to 5 years. ? ?Hyperlipidemia: ?Zocor 40 mg daily, no new myalgias or side effects.  ?Last ate 6 hrs ago.  ?Lab Results  ?Component Value Date  ? CHOL 139 01/29/2020  ? HDL 62 01/29/2020  ? [place] 66 01/29/2020  ? TRIG 47 01/29/2020  ? CHOLHDL 2.2 01/29/2020  ? ?Lab Results  ?Component Value Date  ? ALT 27 01/29/2020  ? AST 33 01/29/2020  ? ALKPHOS 104 01/29/2020  ? BILITOT 0.5 01/29/2020  ? ? ? ? ?History ?Patient Active Problem List  ? Diagnosis Date Noted  ? Pre-diabetes 01/30/2020  ? Trochanteric  bursitis, left hip 07/02/2016  ? Essential hypertension 03/16/2016  ? Hyperlipidemia 03/16/2016  ? SBO (small bowel obstruction) ([place]) 01/30/2013  ? Tobacco abuse 01/30/2013  ? Degenerative arthritis of left hip 01/24/2013  ? Status post THR (total hip replacement) 01/24/2013  ? ?Past Medical History:  ?Diagnosis Date  ? Chronic bronchitis ([place])   ? "when I get a cold I normally get bronchitis" (01/30/2013)  ? Hepatitis C   ? Hyperlipidemia   ? Hypertension   ? ?Past Surgical History:  ?Procedure Laterality Date  ? ABDOMINAL HYSTERECTOMY  1990's  ? BACK SURGERY    ? x5  ? CHOLECYSTECTOMY  1990's  ? COLONOSCOPY    ? JOINT REPLACEMENT Left 01/24/2013  ? hip  ? LUMBAR SPINE SURGERY  2010; 2011  ? "tightened up some screws" (01/30/2013)  ? POSTERIOR LUMBAR FUSION  2006 X 2; 2008  ? TOTAL HIP ARTHROPLASTY Left 01/24/2013  ? Procedure: LEFT TOTAL HIP ARTHROPLASTY ANTERIOR APPROACH;  Surgeon: Mcarthur Rossetti, MD;  Location: [facility];  Service: Orthopedics;  Laterality: Left;  ? TUBAL LIGATION  1970's  ? ?No Known Allergies ?Prior to Admission medications   ?Medication Sig Start Date End Date Taking? Authorizing Provider  ?aspirin 81 MG tablet Take 81 mg by mouth daily.  Yes [provider]  ?gabapentin (NEURONTIN) 100 MG capsule Take 100 mg by mouth daily. 03/05/20  Yes [provider]  ?hydrochlorothiazide (HYDRODIURIL) 25 MG tablet TAKE 1 TABLET BY MOUTH DAILY 05/19/21  Yes Wendie Agreste, MD  ?HYDROcodone-acetaminophen (NORCO) 10-325 MG tablet Take 1 tablet by mouth every 8 (eight) hours as needed (pain).    Yes [provider]  ?Misc. Devices [facility]) MISC 1 Device by Does not apply route as needed. Needed for low back pain, history of lumbar surgery, rollator insufficient for walking due to bursitis and back pain. 08/27/16  Yes Wendie Agreste, MD  ?simvastatin (ZOCOR) 40 MG tablet TAKE 1 TABLET(40 MG) BY MOUTH DAILY 01/20/21  Yes Wendie Agreste, MD  ? ?Social History   ? ?Socioeconomic History  ? Marital status: Married  ?  Spouse name: Not on file  ? Number of children: Not on file  ? Years of education: Not on file  ? Highest education level: Not on file  ?Occupational History  ? Occupation: retired  ?Tobacco Use  ? Smoking status: Former  ?  Packs/day: 1.00  ?  Years: 45.00  ?  Pack years: 45.00  ?  Types: Cigarettes  ?  Quit date: 05/25/2015  ?  Years since quitting: 6.0  ? Smokeless tobacco: Never  ?Vaping Use  ? Vaping Use: Never used  ?Substance and Sexual Activity  ? Alcohol use: No  ?  Alcohol/week: 0.0 standard drinks  ?  Comment: 01/30/2013 "used to drink beer; none since ~ 1989"  ? Drug use: No  ? Sexual activity: Yes  ?  Birth control/protection: Post-menopausal  ?Other Topics Concern  ? Not on file  ?Social History Narrative  ? Not on file  ? ?Social Determinants of Health  ? ?Financial Resource Strain: Low Risk   ? Difficulty of Paying Living Expenses: Not hard at all  ?Food Insecurity: No Food Insecurity  ? Worried About Charity fundraiser in the Last Year: Never true  ? Ran Out of Food in the Last Year: Never true  ?Transportation Needs: No Transportation Needs  ? Lack of Transportation (Medical): No  ? Lack of Transportation (Non-Medical): No  ?Physical Activity: Inactive  ? Days of Exercise per Week: 0 days  ? Minutes of Exercise per Session: 0 min  ?Stress: No Stress Concern Present  ? Feeling of Stress : Not at all  ?Social Connections: Socially Integrated  ? Frequency of Communication with Friends and Family: More than three times a week  ? Frequency of Social Gatherings with Friends and Family: More than three times a week  ? Attends Religious Services: More than 4 times per year  ? Active Member of Clubs or Organizations: Yes  ? Attends Archivist Meetings: More than 4 times per year  ? Marital Status: Married  ?Intimate Partner Violence: Not At Risk  ? Fear of Current or Ex-Partner: No  ? Emotionally Abused: No  ? Physically Abused: No  ?  Sexually Abused: No  ? ? ?Review of Systems  ?Constitutional:  Negative for fatigue and unexpected weight change.  ?Respiratory:  Negative for chest tightness and shortness of breath.   ?Cardiovascular:  Negative for chest pain, palpitations and leg swelling.  ?Gastrointestinal:  Negative for abdominal pain and blood in stool.  ?Neurological:  Negative for dizziness, syncope, light-headedness and headaches.  ? ? ?Objective:  ? ?Vitals:  ? 05/26/21 1343  ?BP: 136/74  ?Pulse: 78  ?Resp: 15  ?Temp:  97.8 ?F (36.6 ?C)  ?TempSrc: Temporal  ?SpO2: 97%  ?Weight: 163 lb (73.9 kg)  ?Height: '5\' 5"'$  (1.651 m)  ? ? ? ?Physical Exam ?Vitals reviewed.  ?Constitutional:   ?   Appearance: Normal appearance. She is well-developed.  ?HENT:  ?   Head: Normocephalic and atraumatic.  ?Eyes:  ?   Conjunctiva/sclera: Conjunctivae normal.  ?   Pupils: Pupils are equal, round, and reactive to light.  ?Neck:  ?   Vascular: No carotid bruit.  ?Cardiovascular:  ?   Rate and Rhythm: Normal rate and regular rhythm.  ?   Heart sounds: Normal heart sounds.  ?Pulmonary:  ?   Effort: Pulmonary effort is normal.  ?   Breath sounds: Normal breath sounds.  ?Abdominal:  ?   Palpations: Abdomen is soft. There is no pulsatile mass.  ?   Tenderness: There is no abdominal tenderness.  ?Musculoskeletal:  ?   Right lower leg: No edema.  ?   Left lower leg: No edema.  ?   Comments: Cervical spine, no midline bony tenderness.  No paraspinal muscular tenderness.  Slight limitation in rotation, lateral flexion bilaterally.  No focal bony tenderness of clavicle, shoulder, upper arm.  Slight decreased abduction but equal left and right.  Upper arm/bicep/tricep strength equal bilaterally.  Grip strength equal bilaterally.  Neurovascular intact distally.  Prominence of the left first CMC joint but nontender.    ?Skin: ?   General: Skin is warm and dry.  ?Neurological:  ?   Mental Status: She is alert and oriented to person, place, and time.  ?Psychiatric:     ?   Mood  and Affect: Mood normal.     ?   Behavior: Behavior normal.  ? ? ? ? ? ?Assessment & Plan:  ?[name] is a 71 y.o. female . ?Prediabetes ? -Commended on decreased sodas, updated A1c ordered. ? ?Essential hypertensio

## 2021-05-26 NOTE — Patient Instructions (Addendum)
No med changes for now. I will ley you know if concerns on labs. I referred you for colonoscopy.  ?Xray of neck: ?[facility] for xray.  ?Walk in 8:30-4:30 during weekdays, no appointment needed ?[facility]  ?[place], [place] [address] ?Please discuss the neck symptoms with pain management, or with Dr. Saintclair Halsted. Follow up with me if they are unable to see you to discuss these symptoms. ? ?Return to the clinic or go to the nearest emergency room if any of your symptoms worsen or new symptoms occur. ? ? ?

## 2021-06-03 ENCOUNTER — Other Ambulatory Visit: Payer: Self-pay | Admitting: Family Medicine

## 2021-06-03 DIAGNOSIS — R7989 Other specified abnormal findings of blood chemistry: Secondary | ICD-10-CM

## 2021-06-03 NOTE — Progress Notes (Signed)
See lab notes

## 2021-06-09 DIAGNOSIS — M5414 Radiculopathy, thoracic region: Secondary | ICD-10-CM | POA: Diagnosis not present

## 2021-06-18 ENCOUNTER — Other Ambulatory Visit: Payer: Self-pay | Admitting: Family Medicine

## 2021-06-18 ENCOUNTER — Telehealth: Payer: Self-pay | Admitting: Family Medicine

## 2021-06-18 DIAGNOSIS — I10 Essential (primary) hypertension: Secondary | ICD-10-CM

## 2021-06-18 NOTE — Telephone Encounter (Signed)
Attempted call to pt and pt spouse both state number cannot be completed as dialed, 6 month of HCTZ was sent in on 05/26/21 pt does not need a refill at this time  ?

## 2021-06-18 NOTE — Telephone Encounter (Signed)
Pt called in stating that they don't have anything on file for the hydrochlorothiazide. She states that she only as 1 more pill and she didn't pick up the pills on 05/26/21 ?

## 2021-06-19 MED ORDER — HYDROCHLOROTHIAZIDE 25 MG PO TABS: ORAL_TABLET | ORAL | 1 refills | Status: DC

## 2021-06-19 NOTE — Telephone Encounter (Signed)
Pt states that that she didn't pick up any scripts on 05/26/21, can we contact pharmacy?   ?

## 2021-07-11 DIAGNOSIS — M5416 Radiculopathy, lumbar region: Secondary | ICD-10-CM | POA: Diagnosis not present

## 2021-07-11 DIAGNOSIS — M961 Postlaminectomy syndrome, not elsewhere classified: Secondary | ICD-10-CM | POA: Diagnosis not present

## 2021-07-11 DIAGNOSIS — M546 Pain in thoracic spine: Secondary | ICD-10-CM | POA: Diagnosis not present

## 2021-07-11 DIAGNOSIS — M542 Cervicalgia: Secondary | ICD-10-CM | POA: Diagnosis not present

## 2021-07-14 ENCOUNTER — Ambulatory Visit: Payer: Medicare HMO

## 2021-08-04 ENCOUNTER — Ambulatory Visit (AMBULATORY_SURGERY_CENTER): Payer: Medicare HMO

## 2021-08-04 VITALS — Wt 160.0 lb

## 2021-08-04 DIAGNOSIS — M1990 Unspecified osteoarthritis, unspecified site: Secondary | ICD-10-CM

## 2021-08-04 DIAGNOSIS — Z86010 Personal history of colonic polyps: Secondary | ICD-10-CM

## 2021-08-04 HISTORY — DX: Unspecified osteoarthritis, unspecified site: M19.90

## 2021-08-04 MED ORDER — PEG 3350-KCL-NA BICARB-NACL 420 G PO SOLR: 4000.0000 mL | Freq: Once | ORAL | 0 refills | Status: AC

## 2021-08-04 MED ORDER — ONDANSETRON HCL 4 MG PO TABS: 4.0000 mg | ORAL_TABLET | ORAL | 0 refills | Status: DC

## 2021-08-22 ENCOUNTER — Encounter: Payer: Self-pay | Admitting: Internal Medicine

## 2021-08-26 ENCOUNTER — Ambulatory Visit (AMBULATORY_SURGERY_CENTER): Payer: Medicare HMO | Admitting: Internal Medicine

## 2021-08-26 ENCOUNTER — Encounter: Payer: Self-pay | Admitting: Internal Medicine

## 2021-08-26 VITALS — BP 124/75 | HR 65 | Temp 96.8°F | Resp 19 | Ht 65.0 in | Wt 160.0 lb

## 2021-08-26 DIAGNOSIS — I10 Essential (primary) hypertension: Secondary | ICD-10-CM | POA: Diagnosis not present

## 2021-08-26 DIAGNOSIS — Z09 Encounter for follow-up examination after completed treatment for conditions other than malignant neoplasm: Secondary | ICD-10-CM | POA: Diagnosis not present

## 2021-08-26 DIAGNOSIS — E785 Hyperlipidemia, unspecified: Secondary | ICD-10-CM | POA: Diagnosis not present

## 2021-08-26 DIAGNOSIS — Z86010 Personal history of colonic polyps: Secondary | ICD-10-CM | POA: Diagnosis not present

## 2021-08-26 MED ORDER — SODIUM CHLORIDE 0.9 % IV SOLN: 500.0000 mL | Freq: Once | INTRAVENOUS | Status: DC

## 2021-08-26 NOTE — Patient Instructions (Signed)
Handout provided about diverticulosis.  Resume previous diet.  Continue present medications.  Repeat colonoscopy is not recommended for surveillance.   YOU HAD AN ENDOSCOPIC PROCEDURE TODAY AT [facility]:   Refer to the procedure report that was given to you for any specific questions about what was found during the examination.  If the procedure report does not answer your questions, please call your gastroenterologist to clarify.  If you requested that your care partner not be given the details of your procedure findings, then the procedure report has been included in a sealed envelope for you to review at your convenience later.  YOU SHOULD EXPECT: Some feelings of bloating in the abdomen. Passage of more gas than usual.  Walking can help get rid of the air that was put into your GI tract during the procedure and reduce the bloating. If you had a lower endoscopy (such as a colonoscopy or flexible sigmoidoscopy) you may notice spotting of blood in your stool or on the toilet paper. If you underwent a bowel prep for your procedure, you may not have a normal bowel movement for a few days.  Please Note:  You might notice some irritation and congestion in your nose or some drainage.  This is from the oxygen used during your procedure.  There is no need for concern and it should clear up in a day or so.  SYMPTOMS TO REPORT IMMEDIATELY:  Following lower endoscopy (colonoscopy or flexible sigmoidoscopy):  Excessive amounts of blood in the stool  Significant tenderness or worsening of abdominal pains  Swelling of the abdomen that is new, acute  Fever of 100F or higher   For urgent or emergent issues, a gastroenterologist can be reached at any hour by calling [phone]. Do not use MyChart messaging for urgent concerns.    DIET:  We do recommend a small meal at first, but then you may proceed to your regular diet.  Drink plenty of fluids but you should avoid alcoholic beverages  for 24 hours.  ACTIVITY:  You should plan to take it easy for the rest of today and you should NOT DRIVE or use heavy machinery until tomorrow (because of the sedation medicines used during the test).    FOLLOW UP: Our staff will call the number listed on your records the next business day following your procedure.  We will call around 7:15- 8:00 am to check on you and address any questions or concerns that you may have regarding the information given to you following your procedure. If we do not reach you, we will leave a message.  If you develop any symptoms (ie: fever, flu-like symptoms, shortness of breath, cough etc.) before then, please call [phone].  If you test positive for Covid 19 in the 2 weeks post procedure, please call and report this information to [place].    If any biopsies were taken you will be contacted by phone or by letter within the next 1-3 weeks.  Please call us at [phone] if you have not heard about the biopsies in 3 weeks.    SIGNATURES/CONFIDENTIALITY: You and/or your care partner have signed paperwork which will be entered into your electronic medical record.  These signatures attest to the fact that that the information above on your After Visit Summary has been reviewed and is understood.  Full responsibility of the confidentiality of this discharge information lies with you and/or your care-partner.

## 2021-08-26 NOTE — Progress Notes (Unsigned)
PT taken to PACU. Monitors in place. VSS. Report given to RN. 

## 2021-08-26 NOTE — Progress Notes (Signed)
Vitals-DT  History reviewed. 

## 2021-08-26 NOTE — Op Note (Signed)
[place] Patient Name: [name] Procedure Date: 08/26/2021 2:15 PM MRN: [id number] Endoscopist: Docia Chuck. Henrene Pastor , MD Age: 71 Referring MD:  Date of Birth: 04/22/1950 Gender: Female Account #: [id number] Procedure:                Colonoscopy Indications:              . Personal history of nonadvanced adenoma. Index                            exam 2002 was negative for neoplasia. Subsequent                            exam 2017 with diminutive adenoma Medicines:                Monitored Anesthesia Care Procedure:                Pre-Anesthesia Assessment:                           - Prior to the procedure, a History and Physical                            was performed, and patient medications and                            allergies were reviewed. The patient's tolerance of                            previous anesthesia was also reviewed. The risks                            and benefits of the procedure and the sedation                            options and risks were discussed with the patient.                            All questions were answered, and informed consent                            was obtained. Prior Anticoagulants: The patient has                            taken no previous anticoagulant or antiplatelet                            agents. ASA Grade Assessment: II - A patient with                            mild systemic disease. After reviewing the risks                            and benefits, the patient was deemed in  satisfactory condition to undergo the procedure.                           After obtaining informed consent, the colonoscope                            was passed under direct vision. Throughout the                            procedure, the patient's blood pressure, pulse, and                            oxygen saturations were monitored continuously. The                            CF [id number] #[id number] was  introduced through the anus                            and advanced to the the cecum, identified by                            appendiceal orifice and ileocecal valve. The                            ileocecal valve, appendiceal orifice, and rectum                            were photographed. The quality of the bowel                            preparation was excellent. The colonoscopy was                            performed without difficulty. The patient tolerated                            the procedure well. The bowel preparation used was                            GoLYTELY via split dose instruction. Scope In: 2:27:57 PM Scope Out: 2:42:39 PM Scope Withdrawal Time: 0 hours 10 minutes 21 seconds  Total Procedure Duration: 0 hours 14 minutes 42 seconds  Findings:                 Diverticula were found in the sigmoid colon. It was                            mild melanosis. No polyps.                           The exam was otherwise without abnormality on                            direct and retroflexion views. Complications:  No immediate complications. Estimated blood loss:                            None. Estimated Blood Loss:     Estimated blood loss: none. Impression:               - Diverticulosis in the sigmoid colon. Mild                            melanosis.                           - The examination was otherwise normal on direct                            and retroflexion views.                           - No polyps. Recommendation:           - Repeat colonoscopy is not recommended for                            surveillance.                           - Patient has a contact number available for                            emergencies. The signs and symptoms of potential                            delayed complications were discussed with the                            patient. Return to normal activities tomorrow.                            Written discharge  instructions were provided to the                            patient.                           - Resume previous diet.                           - Continue present medications. Docia Chuck. Henrene Pastor, MD 08/26/2021 2:49:09 PM This report has been signed electronically.

## 2021-08-26 NOTE — Progress Notes (Unsigned)
HISTORY OF PRESENT ILLNESS:  [name] is a 71 y.o. female with a history of adenomatous colon polyps.  Previous examinations 2002, 2017.  Now for surveillance.  No complaints  REVIEW OF SYSTEMS:  All non-GI ROS negative. Past Medical History:  Diagnosis Date   Arthritis 08/04/2021   left shoulder   Chronic bronchitis (HCC)    "when I get a cold I normally get bronchitis" (01/30/2013)   Hepatitis C    treated and states negative now on 08/04/21   Hyperlipidemia    Hypertension     Past Surgical History:  Procedure Laterality Date   ABDOMINAL HYSTERECTOMY  10/10/1988   BACK SURGERY     x5   CHOLECYSTECTOMY  10/10/1988   COLONOSCOPY  06/2015   JOINT REPLACEMENT Left 01/24/2013   hip   LUMBAR SPINE SURGERY  2010; 2011   "tightened up some screws" (01/30/2013)   POSTERIOR LUMBAR FUSION  2006 X 2; 2008   TOTAL HIP ARTHROPLASTY Left 01/24/2013   Procedure: LEFT TOTAL HIP ARTHROPLASTY ANTERIOR APPROACH;  Surgeon: Mcarthur Rossetti, MD;  Location: [facility];  Service: Orthopedics;  Laterality: Left;   TUBAL LIGATION  10/10/1968    Social History [name]  reports that she quit smoking about 6 years ago. Her smoking use included cigarettes. She has a 45.00 pack-year smoking history. She has never used smokeless tobacco. She reports that she does not drink alcohol and does not use drugs.  family history includes Breast cancer in her sister; Esophageal cancer in her mother; Prostate cancer in her brother.  Allergies  Allergen Reactions   No Known Allergies        PHYSICAL EXAMINATION: Vital signs: BP 119/74 (BP Location: Right Arm, Patient Position: Sitting, Cuff Size: Normal)   Pulse 68   Temp (!) 96.8 F (36 C) (Temporal)   Ht '5\' 5"'$  (1.651 m)   Wt 160 lb (72.6 kg)   SpO2 98%   BMI 26.63 kg/m  General: Well-developed, well-nourished, no acute distress HEENT: Sclerae are anicteric, conjunctiva pink. Oral mucosa intact Lungs: Clear Heart:  Regular Abdomen: soft, nontender, nondistended, no obvious ascites, no peritoneal signs, normal bowel sounds. No organomegaly. Extremities: No edema Psychiatric: alert and oriented x3. Cooperative      ASSESSMENT:  History of adenomatous colon polyps   PLAN:   Surveillance colonoscopy

## 2021-08-27 ENCOUNTER — Ambulatory Visit: Payer: Medicare HMO | Admitting: Family Medicine

## 2021-08-27 ENCOUNTER — Telehealth: Payer: Self-pay

## 2021-08-27 NOTE — Telephone Encounter (Signed)
  Follow up Call-     08/26/2021    1:47 PM 08/26/2021    1:41 PM  Call back number  Post procedure Call Back phone  # [phone]   Permission to leave phone message  Yes     Patient questions:  Do you have a fever, pain , or abdominal swelling? No. Pain Score  0 *  Have you tolerated food without any problems? Yes.    Have you been able to return to your normal activities? Yes.    Do you have any questions about your discharge instructions: Diet   No. Medications  No. Follow up visit  No.  Do you have questions or concerns about your Care? No.  Actions: * If pain score is 4 or above: No action needed, pain <4.

## 2021-08-31 NOTE — Progress Notes (Signed)
Subjective:  Patient ID: [name], female    DOB: [birth year]  Age: 71 y.o. MRN: [id number]  CC:  Chief Complaint  Patient presents with   Annual Exam    Pt is fasting no concerns     HPI [name] presents for Annual Exam  Care team: Pcp: me Optometry, Jodene Nam, [place] [facility], Simeon Craft, PA-C, neurosurgeon Dr. Saintclair Halsted.  Ortho Dr. Marlou Sa.  Postlaminectomy syndrome, thoracic radiculopathy Gastroenterology, Dr. Henrene Pastor.  Colonoscopy 08/26/2021 diverticulosis in sigmoid colon, mild melanosis.  No polyps.  Elevated LFTs Noted on April 17Labs.  AST had increased from 33-41, ALT 27 to 39.  Plan for lab only visit for hepatic function panel, has not been obtained. Tylenol - rare.  Alcohol - none.   Hypertension: HCTZ 25 mg daily, no new side effects Home readings:none BP Readings from Last 3 Encounters:  09/01/21 128/60  08/26/21 124/75  05/26/21 136/74   Lab Results  Component Value Date   CREATININE 0.74 05/26/2021   Hyperlipidemia: Zocor 40 mg daily.no new myalgias or side effects.  Lab Results  Component Value Date   CHOL 149 05/26/2021   HDL 62.60 05/26/2021   LDLCALC 71 05/26/2021   TRIG 74.0 05/26/2021   CHOLHDL 2 05/26/2021   Lab Results  Component Value Date   ALT 39 (H) 05/26/2021   AST 41 (H) 05/26/2021   ALKPHOS 79 05/26/2021   BILITOT 0.4 05/26/2021    Prediabetes: No prescription meds.  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  Weight had improved some as well. No known weigh gain. Still watching diet. Walking daily.   Lab Results  Component Value Date   HGBA1C 6.3 05/26/2021   Wt Readings from Last 3 Encounters:  09/01/21 168 lb 12.8 oz (76.6 kg)  08/26/21 160 lb (72.6 kg)  08/04/21 160 lb (72.6 kg)       09/01/2021    9:27 AM 05/26/2021    1:48 PM 07/01/2020   11:19 AM 05/03/2020    1:33 PM 01/29/2020    9:33 AM  Depression screen PHQ 2/9   Decreased Interest 0 0 0 0 0  Down, Depressed, Hopeless 0 0 0 0 0  PHQ - 2 Score 0 0 0 0 0    Health Maintenance  Topic Date Due   COVID-19 Vaccine (4 - Booster) 09/17/2021 (Originally 01/30/2020)   INFLUENZA VACCINE  09/09/2021   MAMMOGRAM  10/03/2022   TETANUS/TDAP  11/04/2025   COLONOSCOPY (Pts 45-67yr Insurance coverage will need to be confirmed)  08/27/2026   Pneumonia Vaccine 70 Years old  Completed   DEXA SCAN  Completed   Hepatitis C Screening  Completed   Zoster Vaccines- Shingrix  Completed   HPV VACCINES  Aged Out  Colonoscopy recently as above Mammogram 10/02/2020, 10/24/20. plans repeat soon.  History of osteopenia on prior bone density.agrees to repeat. No Ca/vit D supplements.  Lung cancer screening, discussed at wellness exam last year but was declined.  Former smoker, 1 pack/day for approximately 45 years and quit in 2017.   Immunization History  Administered Date(s) Administered   Influenza Split 11/10/2011, 10/25/2014   Influenza,inj,Quad PF,6+ Mos 12/06/2012, 11/10/2014, 10/15/2016   Influenza-Unspecified 10/10/2013, 11/05/2015, 10/19/2017   Moderna Sars-Covid-2 Vaccination 03/25/2019, 04/22/2019   PFIZER(Purple Top)SARS-COV-2 Vaccination 12/05/2019   Pneumococcal Conjugate-13 01/18/2015, 10/19/2017   Pneumococcal Polysaccharide-23 10/24/2018   Pneumococcal-Unspecified 12/15/2004   Tdap 09/02/2006, 11/05/2015   Zoster  Recombinat (Shingrix) 08/06/2016, 10/17/2016   Zoster, Live 11/25/2012  COVID bivalent booster - had booster, unsure of timing.   No results found. Followed yearly - Dr. Newman Nip.   Dental: new dentures with visit recently  Alcohol:none  Tobacco: none, quit in 2017.   Exercise:walking - on the go. Unknown timing.using cane to walk and walking chair with seat if needed.    History Patient Active Problem List   Diagnosis Date Noted   Pre-diabetes 01/30/2020   Trochanteric bursitis, left hip 07/02/2016   Essential hypertension  03/16/2016   Hyperlipidemia 03/16/2016   SBO (small bowel obstruction) ([place]) 01/30/2013   Tobacco abuse 01/30/2013   Degenerative arthritis of left hip 01/24/2013   Status post THR (total hip replacement) 01/24/2013   Past Medical History:  Diagnosis Date   Arthritis 08/04/2021   left shoulder   Chronic bronchitis ([facility])    "when I get a cold I normally get bronchitis" (01/30/2013)   Hepatitis C    treated and states negative now on 08/04/21   Hyperlipidemia    Hypertension    Past Surgical History:  Procedure Laterality Date   ABDOMINAL HYSTERECTOMY  10/10/1988   BACK SURGERY     x5   CHOLECYSTECTOMY  10/10/1988   COLONOSCOPY  06/2015   JOINT REPLACEMENT Left 01/24/2013   hip   LUMBAR SPINE SURGERY  2010; 2011   "tightened up some screws" (01/30/2013)   POSTERIOR LUMBAR FUSION  2006 X 2; 2008   TOTAL HIP ARTHROPLASTY Left 01/24/2013   Procedure: LEFT TOTAL HIP ARTHROPLASTY ANTERIOR APPROACH;  Surgeon: Mcarthur Rossetti, MD;  Location: [facility];  Service: Orthopedics;  Laterality: Left;   TUBAL LIGATION  10/10/1968   Allergies  Allergen Reactions   No Known Allergies    Prior to Admission medications   Medication Sig Start Date End Date Taking? Authorizing Provider  acetaminophen (TYLENOL) 500 MG tablet Take 500 mg by mouth every 6 (six) hours as needed.    [provider]  aspirin 81 MG tablet Take 81 mg by mouth daily.    [provider]  gabapentin (NEURONTIN) 100 MG capsule Take 100 mg by mouth daily. 03/05/20   [provider]  hydrochlorothiazide (HYDRODIURIL) 25 MG tablet TAKE 1 TABLET BY MOUTH DAILY 06/19/21   Wendie Agreste, MD  HYDROcodone-acetaminophen [facility]) 10-325 MG tablet Take 1 tablet by mouth every 8 (eight) hours as needed (pain).     [provider]  Misc. Devices [facility]) MISC 1 Device by Does not apply route as needed. Needed for low back pain, history of lumbar surgery, rollator insufficient for walking due  to bursitis and back pain. 08/27/16   Wendie Agreste, MD  ondansetron (ZOFRAN) 4 MG tablet Take 1 tablet (4 mg total) by mouth as directed for 2 doses. Take one Zofran 4 mg tablet 30-60 minutes before each prep dose 08/04/21   Irene Shipper, MD  simvastatin (ZOCOR) 40 MG tablet TAKE 1 TABLET(40 MG) BY MOUTH DAILY 05/26/21   Wendie Agreste, MD  ZTLIDO 1.8 % [facility] Apply 1 patch topically daily. 07/14/21   [provider]   Social History   Socioeconomic History   Marital status: Widowed    Spouse name: Not on file   Number of children: Not on file   Years of education: Not on file   Highest education level: Not on file  Occupational History   Occupation: retired  Tobacco Use   Smoking status: Former    Packs/day:  1.00    Years: 45.00    Total pack years: 45.00    Types: Cigarettes    Quit date: 05/25/2015    Years since quitting: 6.2   Smokeless tobacco: Never  Vaping Use   Vaping Use: Never used  Substance and Sexual Activity   Alcohol use: No    Alcohol/week: 0.0 standard drinks of alcohol    Comment: 01/30/2013 "used to drink beer; none since ~ 1989"   Drug use: Never   Sexual activity: Yes    Birth control/protection: Post-menopausal  Other Topics Concern   Not on file  Social History Narrative   Not on file   Social Determinants of Health   Financial Resource Strain: Low Risk  (07/01/2020)   Overall Financial Resource Strain (CARDIA)    Difficulty of Paying Living Expenses: Not hard at all  Food Insecurity: No Food Insecurity (07/01/2020)   Hunger Vital Sign    Worried About Running Out of Food in the Last Year: Never true    [place] in the Last Year: Never true  Transportation Needs: No Transportation Needs (07/01/2020)   PRAPARE - Hydrologist (Medical): No    Lack of Transportation (Non-Medical): No  Physical Activity: Inactive (07/01/2020)   Exercise Vital Sign    Days of Exercise per Week: 0 days    Minutes of  Exercise per Session: 0 min  Stress: No Stress Concern Present (07/01/2020)   [place]    Feeling of Stress : Not at all  Social Connections: [place] (07/01/2020)   Social Connection and Isolation Panel [NHANES]    Frequency of Communication with Friends and Family: More than three times a week    Frequency of Social Gatherings with Friends and Family: More than three times a week    Attends Religious Services: More than 4 times per year    Active Member of [employer] or Organizations: Yes    Attends Archivist Meetings: More than 4 times per year    Marital Status: Married  Human resources officer Violence: Not At Risk (07/01/2020)   Humiliation, Afraid, Rape, and Kick questionnaire    Fear of Current or Ex-Partner: No    Emotionally Abused: No    Physically Abused: No    Sexually Abused: No    Review of Systems 13 point review of systems per patient health survey noted.  Negative other than as indicated above or in HPI.    Objective:   Vitals:   09/01/21 0923  BP: 128/60  Pulse: 76  Resp: 16  Temp: 98.9 F (37.2 C)  TempSrc: Oral  SpO2: 92%  Weight: 168 lb 12.8 oz (76.6 kg)  Height: '5\' 5"'$  (1.651 m)     Physical Exam Vitals reviewed.  Constitutional:      Appearance: She is well-developed.  HENT:     Head: Normocephalic and atraumatic.     Right Ear: External ear normal.     Left Ear: External ear normal.  Eyes:     Conjunctiva/sclera: Conjunctivae normal.     Pupils: Pupils are equal, round, and reactive to light.  Neck:     Thyroid: No thyromegaly.  Cardiovascular:     Rate and Rhythm: Normal rate and regular rhythm.     Heart sounds: Normal heart sounds. No murmur heard. Pulmonary:     Effort: Pulmonary effort is normal. No respiratory distress.     Breath sounds: Normal  breath sounds. No wheezing.  Abdominal:     General: Bowel sounds are normal.     Palpations: Abdomen is  soft.     Tenderness: There is no abdominal tenderness.  Musculoskeletal:        General: No tenderness. Normal range of motion.     Cervical back: Normal range of motion and neck supple.  Lymphadenopathy:     Cervical: No cervical adenopathy.  Skin:    General: Skin is warm and dry.     Findings: No rash.  Neurological:     Mental Status: She is alert and oriented to person, place, and time.  Psychiatric:        Behavior: Behavior normal.        Thought Content: Thought content normal.        Assessment & Plan:  [name] is a 71 y.o. female . Annual physical exam - Plan: Comprehensive metabolic panel, Lipid panel, Hemoglobin A1c  - -anticipatory guidance as below in AVS, screening labs above. Health maintenance items as above in HPI discussed/recommended as applicable.   Elevated LFTs  - repeat labs, then determine need for further testing, follow up.   Essential hypertension - Plan: Comprehensive metabolic panel, hydrochlorothiazide (HYDRODIURIL) 25 MG tablet  -  Stable, tolerating current regimen. Medications refilled. Labs pending as above.   Prediabetes - Plan: Hemoglobin A1c  -Check labs, continue to watch diet, activity as tolerated.  Hyperlipidemia, unspecified hyperlipidemia type - Plan: Lipid panel, simvastatin (ZOCOR) 40 MG tablet Hyperlipidemia, unspecified hyperlipidemia type -  - Plan: Lipid panel, simvastatin (ZOCOR) 40 MG tablet  -  Stable, tolerating current regimen. Medications refilled. Labs pending as above.   History of tobacco use - Plan: Ambulatory Referral Lung Cancer Screening [facility]  Osteopenia, unspecified location - Plan: DG Bone Density  -Calcium, vitamin D supplementation discussed, updated bone density testing ordered.  No orders of the defined types were placed in this encounter.  There are no Patient Instructions on file for this visit.    Signed,   Merri Ray, MD [place] 09/01/21 9:46 AM

## 2021-09-01 ENCOUNTER — Encounter: Payer: Self-pay | Admitting: Family Medicine

## 2021-09-01 ENCOUNTER — Ambulatory Visit (INDEPENDENT_AMBULATORY_CARE_PROVIDER_SITE_OTHER): Payer: Medicare HMO | Admitting: Family Medicine

## 2021-09-01 VITALS — BP 128/60 | HR 76 | Temp 98.9°F | Resp 16 | Ht 65.0 in | Wt 168.8 lb

## 2021-09-01 DIAGNOSIS — I10 Essential (primary) hypertension: Secondary | ICD-10-CM | POA: Diagnosis not present

## 2021-09-01 DIAGNOSIS — M8580 Other specified disorders of bone density and structure, unspecified site: Secondary | ICD-10-CM | POA: Diagnosis not present

## 2021-09-01 DIAGNOSIS — Z87891 Personal history of nicotine dependence: Secondary | ICD-10-CM

## 2021-09-01 DIAGNOSIS — R7303 Prediabetes: Secondary | ICD-10-CM

## 2021-09-01 DIAGNOSIS — R7989 Other specified abnormal findings of blood chemistry: Secondary | ICD-10-CM | POA: Diagnosis not present

## 2021-09-01 DIAGNOSIS — E785 Hyperlipidemia, unspecified: Secondary | ICD-10-CM

## 2021-09-01 DIAGNOSIS — Z0000 Encounter for general adult medical examination without abnormal findings: Secondary | ICD-10-CM

## 2021-09-01 LAB — COMPREHENSIVE METABOLIC PANEL
ALT: 23 U/L (ref 0–35)
AST: 27 U/L (ref 0–37)
Albumin: 4.5 g/dL (ref 3.5–5.2)
Alkaline Phosphatase: 68 U/L (ref 39–117)
BUN: 17 mg/dL (ref 6–23)
CO2: 29 mEq/L (ref 19–32)
Calcium: 10.1 mg/dL (ref 8.4–10.5)
Chloride: 103 mEq/L (ref 96–112)
Creatinine, Ser: 0.61 mg/dL (ref 0.40–1.20)
GFR: 90.7 mL/min (ref >60.00)
Glucose, Bld: 98 mg/dL (ref 70–99)
Potassium: 3.9 mEq/L (ref 3.5–5.1)
Sodium: 140 mEq/L (ref 135–145)
Total Bilirubin: 0.4 mg/dL (ref 0.2–1.2)
Total Protein: 7.6 g/dL (ref 6.0–8.3)

## 2021-09-01 LAB — LIPID PANEL
Cholesterol: 144 mg/dL (ref 0–200)
HDL: 55.3 mg/dL (ref >39.00)
LDL Cholesterol: 73 mg/dL (ref 0–99)
NonHDL: 88.34
Total CHOL/HDL Ratio: 3
Triglycerides: 76 mg/dL (ref 0.0–149.0)
VLDL: 15.2 mg/dL (ref 0.0–40.0)

## 2021-09-01 LAB — HEMOGLOBIN A1C: Hgb A1c MFr Bld: 6.3 % (ref 4.6–6.5)

## 2021-09-01 MED ORDER — HYDROCHLOROTHIAZIDE 25 MG PO TABS: ORAL_TABLET | ORAL | 1 refills | Status: DC

## 2021-09-01 MED ORDER — SIMVASTATIN 40 MG PO TABS: ORAL_TABLET | ORAL | 1 refills | Status: DC

## 2021-09-01 NOTE — Patient Instructions (Addendum)
I will check labs and refer you for lung cancer screening.  [place] +D may be good as I recommend at least '1200mg'$  calcium and 1000 units vitamin D per day. We will order updated bone density test.  If more than 4 months since last bivalent covid booster, I recommend updated booster.  No med changes today. Take care!  Health Maintenance After Age 70 After age 71, you are at a higher risk for certain long-term diseases and infections as well as injuries from falls. Falls are a major cause of broken bones and head injuries in people who are older than age 94. Getting regular preventive care can help to keep you healthy and well. Preventive care includes getting regular testing and making lifestyle changes as recommended by your health care provider. Talk with your health care provider about: Which screenings and tests you should have. A screening is a test that checks for a disease when you have no symptoms. A diet and exercise plan that is right for you. What should I know about screenings and tests to prevent falls? Screening and testing are the best ways to find a health problem early. Early diagnosis and treatment give you the best chance of managing medical conditions that are common after age 38. Certain conditions and lifestyle choices may make you more likely to have a fall. Your health care provider may recommend: Regular vision checks. Poor vision and conditions such as cataracts can make you more likely to have a fall. If you wear glasses, make sure to get your prescription updated if your vision changes. Medicine review. Work with your health care provider to regularly review all of the medicines you are taking, including over-the-counter medicines. Ask your health care provider about any side effects that may make you more likely to have a fall. Tell your health care provider if any medicines that you take make you feel dizzy or sleepy. Strength and balance checks. Your health care provider may  recommend certain tests to check your strength and balance while standing, walking, or changing positions. Foot health exam. Foot pain and numbness, as well as not wearing proper footwear, can make you more likely to have a fall. Screenings, including: Osteoporosis screening. Osteoporosis is a condition that causes the bones to get weaker and break more easily. Blood pressure screening. Blood pressure changes and medicines to control blood pressure can make you feel dizzy. Depression screening. You may be more likely to have a fall if you have a fear of falling, feel depressed, or feel unable to do activities that you used to do. Alcohol use screening. Using too much alcohol can affect your balance and may make you more likely to have a fall. Follow these instructions at home: Lifestyle Do not drink alcohol if: Your health care provider tells you not to drink. If you drink alcohol: Limit how much you have to: 0-1 drink a day for women. 0-2 drinks a day for men. Know how much alcohol is in your drink. In the [place] one drink equals one 12 oz bottle of beer (355 mL), one 5 oz glass of wine (148 mL), or one 1 oz glass of hard liquor (44 mL). Do not use any products that contain nicotine or tobacco. These products include cigarettes, chewing tobacco, and vaping devices, such as e-cigarettes. If you need help quitting, ask your health care provider. Activity  Follow a regular exercise program to stay fit. This will help you maintain your balance. Ask your health care provider  what types of exercise are appropriate for you. If you need a cane or walker, use it as recommended by your health care provider. Wear supportive shoes that have nonskid soles. Safety  Remove any tripping hazards, such as rugs, cords, and clutter. Install safety equipment such as grab bars in bathrooms and safety rails on stairs. Keep rooms and walkways well-lit. General instructions Talk with your health care provider  about your risks for falling. Tell your health care provider if: You fall. Be sure to tell your health care provider about all falls, even ones that seem minor. You feel dizzy, tiredness (fatigue), or off-balance. Take over-the-counter and prescription medicines only as told by your health care provider. These include supplements. Eat a healthy diet and maintain a healthy weight. A healthy diet includes low-fat dairy products, low-fat (lean) meats, and fiber from whole grains, beans, and lots of fruits and vegetables. Stay current with your vaccines. Schedule regular health, dental, and eye exams. Summary Having a healthy lifestyle and getting preventive care can help to protect your health and wellness after age 23. Screening and testing are the best way to find a health problem early and help you avoid having a fall. Early diagnosis and treatment give you the best chance for managing medical conditions that are more common for people who are older than age 10. Falls are a major cause of broken bones and head injuries in people who are older than age 64. Take precautions to prevent a fall at home. Work with your health care provider to learn what changes you can make to improve your health and wellness and to prevent falls. This information is not intended to replace advice given to you by your health care provider. Make sure you discuss any questions you have with your health care provider. Document Revised: 06/17/2020 Document Reviewed: 06/17/2020 Elsevier Patient Education  [place].

## 2021-09-12 DIAGNOSIS — M5414 Radiculopathy, thoracic region: Secondary | ICD-10-CM | POA: Diagnosis not present

## 2021-09-30 ENCOUNTER — Telehealth: Payer: Self-pay | Admitting: Family Medicine

## 2021-09-30 NOTE — Telephone Encounter (Signed)
Left message for patient to call back and schedule Medicare Annual Wellness Visit (AWV).   Please offer to do virtually or by telephone.  Left office number and my jabber [phone].  Last AWV:07/01/2020  Please schedule at anytime with Nurse Health Advisor.

## 2021-10-06 ENCOUNTER — Other Ambulatory Visit: Payer: Self-pay | Admitting: Family Medicine

## 2021-10-06 DIAGNOSIS — Z1231 Encounter for screening mammogram for malignant neoplasm of breast: Secondary | ICD-10-CM

## 2021-10-17 DIAGNOSIS — M5414 Radiculopathy, thoracic region: Secondary | ICD-10-CM | POA: Diagnosis not present

## 2021-10-17 DIAGNOSIS — M542 Cervicalgia: Secondary | ICD-10-CM | POA: Diagnosis not present

## 2021-10-17 DIAGNOSIS — M961 Postlaminectomy syndrome, not elsewhere classified: Secondary | ICD-10-CM | POA: Diagnosis not present

## 2021-10-31 ENCOUNTER — Ambulatory Visit
Admission: RE | Admit: 2021-10-31 | Discharge: 2021-10-31 | Disposition: A | Discharge status: Home / Self Care | Payer: Medicare HMO | Source: Ambulatory Visit | Attending: Family Medicine | Admitting: Family Medicine

## 2021-10-31 DIAGNOSIS — Z1231 Encounter for screening mammogram for malignant neoplasm of breast: Secondary | ICD-10-CM

## 2021-11-19 ENCOUNTER — Ambulatory Visit (INDEPENDENT_AMBULATORY_CARE_PROVIDER_SITE_OTHER): Payer: Medicare HMO

## 2021-11-19 VITALS — Ht 65.0 in | Wt 160.0 lb

## 2021-11-19 DIAGNOSIS — Z0000 Encounter for general adult medical examination without abnormal findings: Secondary | ICD-10-CM | POA: Diagnosis not present

## 2021-11-19 NOTE — Progress Notes (Signed)
Subjective:   [name] is a 71 y.o. female who presents for Medicare Annual (Subsequent) preventive examination.   Virtual Visit via Telephone Note  I connected with  [name] on 11/19/21 at  8:15 AM EDT by telephone and verified that I am speaking with the correct person using two identifiers.  Location: Patient: home  Provider: [place] participating in the virtual visit: patient/Nurse Health Advisor   I discussed the limitations, risks, security and privacy concerns of performing an evaluation and management service by telephone and the availability of in person appointments. The patient expressed understanding and agreed to proceed.  Interactive audio and video telecommunications were attempted between this nurse and patient, however failed, due to patient having technical difficulties OR patient did not have access to video capability.  We continued and completed visit with audio only.  Some vital signs may be absent or patient reported.   Kimberly Shepherd, LPN  Review of Systems     Cardiac Risk Factors include: advanced age (>100mn, >>29women);hypertension     Objective:    Today's Vitals   11/19/21 0820  Weight: 160 lb (72.6 kg)  Height: '5\' 5"'$  (1.651 m)   Body mass index is 26.63 kg/m.     11/19/2021    8:25 AM 07/01/2020   11:17 AM 06/20/2019    1:08 PM 05/16/2018    9:04 AM 02/01/2018    7:10 AM 10/15/2016   10:39 AM 01/30/2013    4:59 PM  Advanced Directives  Does Patient Have a Medical Advance Directive? No No No No No No Patient does not have advance directive;Patient would not like information  Does patient want to make changes to medical advance directive?   No - Patient declined      Would patient like information on creating a medical advance directive? No - Patient declined No - Patient declined No - Patient declined No - Patient declined  No - Patient declined     Current Medications (verified) Outpatient Encounter  Medications as of 11/19/2021  Medication Sig   aspirin 81 MG tablet Take 81 mg by mouth daily.   gabapentin (NEURONTIN) 100 MG capsule Take 100 mg by mouth daily.   hydrochlorothiazide (HYDRODIURIL) 25 MG tablet TAKE 1 TABLET BY MOUTH DAILY   HYDROcodone-acetaminophen (NORCO) 10-325 MG tablet Take 1 tablet by mouth every 8 (eight) hours as needed (pain).    Misc. Devices ([facility] MISC 1 Device by Does not apply route as needed. Needed for low back pain, history of lumbar surgery, rollator insufficient for walking due to bursitis and back pain.   simvastatin (ZOCOR) 40 MG tablet TAKE 1 TABLET(40 MG) BY MOUTH DAILY   ZTLIDO 1.8 % PTCH Apply 1 patch topically daily.   ondansetron (ZOFRAN) 4 MG tablet Take 1 tablet (4 mg total) by mouth as directed for 2 doses. Take one Zofran 4 mg tablet 30-60 minutes before each prep dose   No facility-administered encounter medications on file as of 11/19/2021.    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   Chronic bronchitis ([place]    "when I get a cold I normally get bronchitis" (01/30/2013)   Hepatitis C    treated and states negative now on 08/04/21   Hyperlipidemia    Hypertension    Past Surgical History:  Procedure Laterality Date   ABDOMINAL HYSTERECTOMY  10/10/1988   BACK  SURGERY     x5   CHOLECYSTECTOMY  10/10/1988   COLONOSCOPY  06/2015   JOINT REPLACEMENT Left 01/24/2013   hip   LUMBAR SPINE SURGERY  2010; 2011   "tightened up some screws" (01/30/2013)   POSTERIOR LUMBAR FUSION  2006 X 2; 2008   TOTAL HIP ARTHROPLASTY Left 01/24/2013   Procedure: LEFT TOTAL HIP ARTHROPLASTY ANTERIOR APPROACH;  Surgeon: Kimberly Rossetti, MD;  Location: [facility];  Service: Orthopedics;  Laterality: Left;   TUBAL LIGATION  10/10/1968   Family History  Problem Relation Age of Onset   Esophageal cancer Mother    Breast cancer Sister    Prostate cancer  Brother    Colon cancer Neg Hx    Rectal cancer Neg Hx    Stomach cancer Neg Hx    Colon polyps Neg Hx    Social History   Socioeconomic History   Marital status: Widowed    Spouse name: Not on file   Number of children: Not on file   Years of education: Not on file   Highest education level: Not on file  Occupational History   Occupation: retired  Tobacco Use   Smoking status: Former    Packs/day: 1.00    Years: 45.00    Total pack years: 45.00    Types: Cigarettes    Quit date: 05/25/2015    Years since quitting: 6.4   Smokeless tobacco: Never  Vaping Use   Vaping Use: Never used  Substance and Sexual Activity   Alcohol use: No    Alcohol/week: 0.0 standard drinks of alcohol    Comment: 01/30/2013 "used to drink beer; none since ~ 1989"   Drug use: Never   Sexual activity: Yes    Birth control/protection: Post-menopausal  Other Topics Concern   Not on file  Social History Narrative   Not on file   Social Determinants of Health   Financial Resource Strain: Low Risk  (11/19/2021)   Overall Financial Resource Strain (CARDIA)    Difficulty of Paying Living Expenses: Not hard at all  Food Insecurity: No Food Insecurity (11/19/2021)   Hunger Vital Sign    Worried About Running Out of Food in the Last Year: Never true    [place] in the Last Year: Never true  Transportation Needs: No Transportation Needs (11/19/2021)   PRAPARE - Hydrologist (Medical): No    Lack of Transportation (Non-Medical): No  Physical Activity: Insufficiently Active (11/19/2021)   Exercise Vital Sign    Days of Exercise per Week: 2 days    Minutes of Exercise per Session: 30 min  Stress: No Stress Concern Present (11/19/2021)   [place]    Feeling of Stress : Not at all  Social Connections: Moderately Integrated (11/19/2021)   Social Connection and Isolation Panel [NHANES]    Frequency  of Communication with Friends and Family: More than three times a week    Frequency of Social Gatherings with Friends and Family: More than three times a week    Attends Religious Services: More than 4 times per year    Active Member of [employer] or Organizations: Yes    Attends Archivist Meetings: More than 4 times per year    Marital Status: Widowed    Tobacco Counseling Counseling given: Not Answered   Clinical Intake:  Pre-visit preparation completed: Yes  Pain : No/denies pain     Nutritional Risks:  None Diabetes: No  How often do you need to have someone help you when you read instructions, pamphlets, or other written materials from your doctor or pharmacy?: 1 - Never  Diabetic?no   Interpreter Needed?: No  Information entered by :: Jadene Pierini, LPN   Activities of Daily Living    11/19/2021    8:25 AM 11/16/2021    3:25 PM  In your present state of health, do you have any difficulty performing the following activities:  Hearing? 0 0  Vision? 0 0  Difficulty concentrating or making decisions? 0 0  Walking or climbing stairs? 0 1  Dressing or bathing? 0 0  Doing errands, shopping? 0 0  Preparing Food and eating ? N N  Using the Toilet? N N  In the past six months, have you accidently leaked urine? N N  Do you have problems with loss of bowel control? N N  Managing your Medications? N N  Managing your Finances? N N  Housekeeping or managing your Housekeeping? N N    Patient Care Team: Wendie Agreste, MD as PCP - General (Family Medicine) Webb Laws, Gray as Referring Physician (Optometry) Chyrl Civatte Thane Edu, PA-C as Physician Assistant (Neurosurgery)  Indicate any recent Medical Services you may have received from other than [facility] providers in the past year (date may be approximate).     Assessment:   This is a routine wellness examination for Navjot.  Hearing/Vision screen Vision Screening - Comments:: Annual eye exams  wears glasses   Dietary issues and exercise activities discussed: Current Exercise Habits: Home exercise routine, Type of exercise: walking, Time (Minutes): 20, Frequency (Times/Week): 2, Weekly Exercise (Minutes/Week): 40, Intensity: Mild, Exercise limited by: None identified   Goals Addressed             This Visit's Progress    Increase water intake   On track    Patient will try to increase her water intake daily and decrease her soda intake.        Depression Screen    11/19/2021    8:23 AM 09/01/2021    9:27 AM 05/26/2021    1:48 PM 07/01/2020   11:19 AM 05/03/2020    1:33 PM 01/29/2020    9:33 AM 06/20/2019    1:11 PM  PHQ 2/9 Scores  PHQ - 2 Score 0 0 0 0 0 0 0    Fall Risk    11/19/2021    8:21 AM 10/29/2021    9:38 PM 09/01/2021    9:26 AM 05/26/2021    1:47 PM 07/01/2020   11:18 AM  [place] in the past year? 0 0 0 0 0  Number falls in past yr: 0  0 0 0  Injury with Fall? 0  0 0 0  Risk for fall due to : No Fall Risks  No Fall Risks No Fall Risks   Follow up Falls prevention discussed  Falls evaluation completed Falls evaluation completed Falls prevention discussed    FALL RISK PREVENTION PERTAINING TO THE HOME:  Any stairs in or around the home? No  If so, are there any without handrails? No  Home free of loose throw rugs in walkways, pet beds, electrical cords, etc? Yes  Adequate lighting in your home to reduce risk of falls? Yes   ASSISTIVE DEVICES UTILIZED TO PREVENT FALLS:  Life alert? No  Use of a cane, walker or w/c? Yes  Grab bars in the bathroom? Yes  Shower chair or bench in shower? No  Elevated toilet seat or a handicapped toilet? No        11/19/2021    8:26 AM 09/01/2021    9:26 AM 06/20/2019    1:08 PM 05/16/2018    9:08 AM 10/15/2016   10:43 AM  6CIT Screen  What Year? 0 points 0 points 0 points 0 points 0 points  What month? 0 points 0 points 0 points 0 points 0 points  What time? 0 points 0 points 0 points 0 points 0  points  Count back from 20 0 points 0 points 0 points 0 points 0 points  Months in reverse 0 points 0 points 2 points 0 points 0 points  Repeat phrase 0 points 0 points 2 points 0 points 0 points  Total Score 0 points 0 points 4 points 0 points 0 points    Immunizations Immunization History  Administered Date(s) Administered   Influenza Split 11/10/2011, 10/25/2014   Influenza,inj,Quad PF,6+ Mos 12/06/2012, 11/10/2014, 10/15/2016   Influenza-Unspecified 10/10/2013, 11/05/2015, 10/19/2017   Moderna Sars-Covid-2 Vaccination 03/25/2019, 04/22/2019   PFIZER(Purple Top)SARS-COV-2 Vaccination 12/05/2019   Pneumococcal Conjugate-13 01/18/2015, 10/19/2017   Pneumococcal Polysaccharide-23 10/24/2018   Pneumococcal-Unspecified 12/15/2004   Tdap 09/02/2006, 11/05/2015   Zoster Recombinat (Shingrix) 08/06/2016, 10/17/2016   Zoster, Live 11/25/2012    TDAP status: Up to date  Flu Vaccine status: Due, Education has been provided regarding the importance of this vaccine. Advised may receive this vaccine at local pharmacy or Health Dept. Aware to provide a copy of the vaccination record if obtained from local pharmacy or Health Dept. Verbalized acceptance and understanding.  Pneumococcal vaccine status: Up to date  Covid-19 vaccine status: Completed vaccines  Qualifies for Shingles Vaccine? Yes   Zostavax completed Yes   Shingrix Completed?: Yes  Screening Tests Health Maintenance  Topic Date Due   COVID-19 Vaccine (4 - Mixed Product risk series) 01/30/2020   INFLUENZA VACCINE  09/09/2021   MAMMOGRAM  11/01/2023   TETANUS/TDAP  11/04/2025   COLONOSCOPY (Pts 45-23yr Insurance coverage will need to be confirmed)  08/27/2026   Pneumonia Vaccine 70 Years old  Completed   DEXA SCAN  Completed   Hepatitis C Screening  Completed   Zoster Vaccines- Shingrix  Completed   HPV VACCINES  Aged Out    Health Maintenance  Health Maintenance Due  Topic Date Due   COVID-19 Vaccine (4 - Mixed  Product risk series) 01/30/2020   INFLUENZA VACCINE  09/09/2021    Colorectal cancer screening: Type of screening: Colonoscopy. Completed 08/26/2021. Repeat every 5 years  Mammogram status: Completed 10/31/2021. Repeat every year  Bone Density status: Completed 10/27/2016. Results reflect: Bone density results: OSTEOPENIA. Repeat every 5 years.  Lung Cancer Screening: (Low Dose CT Chest recommended if Age 70-80years, 30 pack-year currently smoking OR have quit w/in 15years.) does not qualify.   Lung Cancer Screening Referral: n/a  Additional Screening:  Hepatitis C Screening: does not qualify; Completed 03/16/2016  Vision Screening: Recommended annual ophthalmology exams for early detection of glaucoma and other disorders of the eye. Is the patient up to date with their annual eye exam?  Yes  Who is the provider or what is the name of the office in which the patient attends annual eye exams? Dr.[place]Farland  If pt is not established with a provider, would they like to be referred to a provider to establish care? No .   Dental Screening: Recommended annual dental exams for proper oral hygiene  Community  Resource Referral / Chronic Care Management: CRR required this visit?  No   CCM required this visit?  No      Plan:     I have personally reviewed and noted the following in the patient's chart:   Medical and social history Use of alcohol, tobacco or illicit drugs  Current medications and supplements including opioid prescriptions. Patient is not currently taking opioid prescriptions. Functional ability and status Nutritional status Physical activity Advanced directives List of other physicians Hospitalizations, surgeries, and ER visits in previous 12 months Vitals Screenings to include cognitive, depression, and falls Referrals and appointments  In addition, I have reviewed and discussed with patient certain preventive protocols, quality metrics, and best practice  recommendations. A written personalized care plan for preventive services as well as general preventive health recommendations were provided to patient.     Kimberly Shepherd, LPN   [id number]   Nurse Notes:

## 2021-11-19 NOTE — Patient Instructions (Signed)
[name] , Thank you for taking time to come for your Medicare Wellness Visit. I appreciate your ongoing commitment to your health goals. Please review the following plan we discussed and let me know if I can assist you in the future.   These are the goals we discussed:  Goals      Increase water intake     Patient will try to increase her water intake daily and decrease her soda intake.      Patient Stated     Would like to get pain under control .  Increase activity slowly.         This is a list of the screening recommended for you and due dates:  Health Maintenance  Topic Date Due   COVID-19 Vaccine (4 - Mixed Product risk series) 01/30/2020   Flu Shot  09/09/2021   Mammogram  11/01/2023   Tetanus Vaccine  11/04/2025   Colon Cancer Screening  08/27/2026   Pneumonia Vaccine  Completed   DEXA scan (bone density measurement)  Completed   Hepatitis C Screening: USPSTF Recommendation to screen - Ages 67-79 yo.  Completed   Zoster (Shingles) Vaccine  Completed   HPV Vaccine  Aged Out    Advanced directives: Advance directive discussed with you today. I have provided a copy for you to complete at home and have notarized. Once this is complete please bring a copy in to our office so we can scan it into your chart.   Conditions/risks identified: Aim for 30 minutes of exercise or brisk walking, 6-8 glasses of water, and 5 servings of fruits and vegetables each day.   Next appointment: Follow up in one year for your annual wellness visit    Preventive Care 65 Years and Older, Female Preventive care refers to lifestyle choices and visits with your health care provider that can promote health and wellness. What does preventive care include? A yearly physical exam. This is also called an annual well check. Dental exams once or twice a year. Routine eye exams. Ask your health care provider how often you should have your eyes checked. Personal lifestyle choices, including: Daily  care of your teeth and gums. Regular physical activity. Eating a healthy diet. Avoiding tobacco and drug use. Limiting alcohol use. Practicing safe sex. Taking low-dose aspirin every day. Taking vitamin and mineral supplements as recommended by your health care provider. What happens during an annual well check? The services and screenings done by your health care provider during your annual well check will depend on your age, overall health, lifestyle risk factors, and family history of disease. Counseling  Your health care provider may ask you questions about your: Alcohol use. Tobacco use. Drug use. Emotional well-being. Home and relationship well-being. Sexual activity. Eating habits. History of falls. Memory and ability to understand (cognition). Work and work Statistician. Reproductive health. Screening  You may have the following tests or measurements: Height, weight, and BMI. Blood pressure. Lipid and cholesterol levels. These may be checked every 5 years, or more frequently if you are over 88 years old. Skin check. Lung cancer screening. You may have this screening every year starting at age 25 if you have a 30-pack-year history of smoking and currently smoke or have quit within the past 15 years. Fecal occult blood test (FOBT) of the stool. You may have this test every year starting at age 59. Flexible sigmoidoscopy or colonoscopy. You may have a sigmoidoscopy every 5 years or a colonoscopy every 10 years starting  at age 19. Hepatitis C blood test. Hepatitis B blood test. Sexually transmitted disease (STD) testing. Diabetes screening. This is done by checking your blood sugar (glucose) after you have not eaten for a while (fasting). You may have this done every 1-3 years. Bone density scan. This is done to screen for osteoporosis. You may have this done starting at age 92. Mammogram. This may be done every 1-2 years. Talk to your health care provider about how often you  should have regular mammograms. Talk with your health care provider about your test results, treatment options, and if necessary, the need for more tests. Vaccines  Your health care provider may recommend certain vaccines, such as: Influenza vaccine. This is recommended every year. Tetanus, diphtheria, and acellular pertussis (Tdap, Td) vaccine. You may need a Td booster every 10 years. Zoster vaccine. You may need this after age 38. Pneumococcal 13-valent conjugate (PCV13) vaccine. One dose is recommended after age 66. Pneumococcal polysaccharide (PPSV23) vaccine. One dose is recommended after age 51. Talk to your health care provider about which screenings and vaccines you need and how often you need them. This information is not intended to replace advice given to you by your health care provider. Make sure you discuss any questions you have with your health care provider. Document Released: 02/22/2015 Document Revised: 10/16/2015 Document Reviewed: 11/27/2014 Elsevier Interactive Patient Education  2017 [place] Prevention in the Home Falls can cause injuries. They can happen to people of all ages. There are many things you can do to make your home safe and to help prevent falls. What can I do on the outside of my home? Regularly fix the edges of walkways and driveways and fix any cracks. Remove anything that might make you trip as you walk through a door, such as a raised step or threshold. Trim any bushes or trees on the path to your home. Use bright outdoor lighting. Clear any walking paths of anything that might make someone trip, such as rocks or tools. Regularly check to see if handrails are loose or broken. Make sure that both sides of any steps have handrails. Any raised decks and porches should have guardrails on the edges. Have any leaves, snow, or ice cleared regularly. Use sand or salt on walking paths during winter. Clean up any spills in your garage right away.  This includes oil or grease spills. What can I do in the bathroom? Use night lights. Install grab bars by the toilet and in the tub and shower. Do not use towel bars as grab bars. Use non-skid mats or decals in the tub or shower. If you need to sit down in the shower, use a plastic, non-slip stool. Keep the floor dry. Clean up any water that spills on the floor as soon as it happens. Remove soap buildup in the tub or shower regularly. Attach bath mats securely with double-sided non-slip rug tape. Do not have throw rugs and other things on the floor that can make you trip. What can I do in the bedroom? Use night lights. Make sure that you have a light by your bed that is easy to reach. Do not use any sheets or blankets that are too big for your bed. They should not hang down onto the floor. Have a firm chair that has side arms. You can use this for support while you get dressed. Do not have throw rugs and other things on the floor that can make you trip. What can  I do in the kitchen? Clean up any spills right away. Avoid walking on wet floors. Keep items that you use a lot in easy-to-reach places. If you need to reach something above you, use a strong step stool that has a grab bar. Keep electrical cords out of the way. Do not use floor polish or wax that makes floors slippery. If you must use wax, use non-skid floor wax. Do not have throw rugs and other things on the floor that can make you trip. What can I do with my stairs? Do not leave any items on the stairs. Make sure that there are handrails on both sides of the stairs and use them. Fix handrails that are broken or loose. Make sure that handrails are as long as the stairways. Check any carpeting to make sure that it is firmly attached to the stairs. Fix any carpet that is loose or worn. Avoid having throw rugs at the top or bottom of the stairs. If you do have throw rugs, attach them to the floor with carpet tape. Make sure that  you have a light switch at the top of the stairs and the bottom of the stairs. If you do not have them, ask someone to add them for you. What else can I do to help prevent falls? Wear shoes that: Do not have high heels. Have rubber bottoms. Are comfortable and fit you well. Are closed at the toe. Do not wear sandals. If you use a stepladder: Make sure that it is fully opened. Do not climb a closed stepladder. Make sure that both sides of the stepladder are locked into place. Ask someone to hold it for you, if possible. Clearly mark and make sure that you can see: Any grab bars or handrails. First and last steps. Where the edge of each step is. Use tools that help you move around (mobility aids) if they are needed. These include: Canes. Walkers. Scooters. Crutches. Turn on the lights when you go into a dark area. Replace any light bulbs as soon as they burn out. Set up your furniture so you have a clear path. Avoid moving your furniture around. If any of your floors are uneven, fix them. If there are any pets around you, be aware of where they are. Review your medicines with your doctor. Some medicines can make you feel dizzy. This can increase your chance of falling. Ask your doctor what other things that you can do to help prevent falls. This information is not intended to replace advice given to you by your health care provider. Make sure you discuss any questions you have with your health care provider. Document Released: 11/22/2008 Document Revised: 07/04/2015 Document Reviewed: 03/02/2014 Elsevier Interactive Patient Education  2017 [employer].

## 2021-12-15 DIAGNOSIS — M5414 Radiculopathy, thoracic region: Secondary | ICD-10-CM | POA: Diagnosis not present

## 2021-12-17 ENCOUNTER — Encounter: Payer: Self-pay | Admitting: Family Medicine

## 2021-12-17 ENCOUNTER — Ambulatory Visit (INDEPENDENT_AMBULATORY_CARE_PROVIDER_SITE_OTHER): Payer: Medicare HMO | Admitting: Family Medicine

## 2021-12-17 VITALS — BP 130/70 | HR 72 | Temp 98.8°F | Ht 65.0 in | Wt 165.4 lb

## 2021-12-17 DIAGNOSIS — L720 Epidermal cyst: Secondary | ICD-10-CM | POA: Diagnosis not present

## 2021-12-17 DIAGNOSIS — Z23 Encounter for immunization: Secondary | ICD-10-CM | POA: Diagnosis not present

## 2021-12-17 NOTE — Progress Notes (Unsigned)
Subjective:  Patient ID: [name], female    DOB: Apr 22, 1950  Age: 71 y.o. MRN: [id number]  CC:  Chief Complaint  Patient presents with   Abrasion    Bump on back that has been itching, pt states it has been itching all the time     HPI [name] presents for   Lesion on back: Past 10 years, itches at times, turned black many years ago. No prior eval. No dermatologist. No discharge.  No d/c, no bleeding. No treatments.  Worse itching.    History Patient Active Problem List   Diagnosis Date Noted   Pre-diabetes 01/30/2020   Trochanteric bursitis, left hip 07/02/2016   Essential hypertension 03/16/2016   Hyperlipidemia 03/16/2016   SBO (small bowel obstruction) ([place]) 01/30/2013   Tobacco abuse 01/30/2013   Degenerative arthritis of left hip 01/24/2013   Status post THR (total hip replacement) 01/24/2013   Past Medical History:  Diagnosis Date   Arthritis 08/04/2021   left shoulder   Chronic bronchitis ([place])    "when I get a cold I normally get bronchitis" (01/30/2013)   Hepatitis C    treated and states negative now on 08/04/21   Hyperlipidemia    Hypertension    Past Surgical History:  Procedure Laterality Date   ABDOMINAL HYSTERECTOMY  10/10/1988   BACK SURGERY     x5   CHOLECYSTECTOMY  10/10/1988   COLONOSCOPY  06/2015   JOINT REPLACEMENT Left 01/24/2013   hip   LUMBAR SPINE SURGERY  2010; 2011   "tightened up some screws" (01/30/2013)   POSTERIOR LUMBAR FUSION  2006 X 2; 2008   TOTAL HIP ARTHROPLASTY Left 01/24/2013   Procedure: LEFT TOTAL HIP ARTHROPLASTY ANTERIOR APPROACH;  Surgeon: Mcarthur Rossetti, MD;  Location: [facility];  Service: Orthopedics;  Laterality: Left;   TUBAL LIGATION  10/10/1968   Allergies  Allergen Reactions   No Known Allergies    Prior to Admission medications   Medication Sig Start Date End Date Taking? Authorizing Provider  aspirin 81 MG tablet Take 81 mg by mouth daily.   Yes [provider]   gabapentin (NEURONTIN) 100 MG capsule Take 100 mg by mouth daily. 03/05/20  Yes [provider]  hydrochlorothiazide (HYDRODIURIL) 25 MG tablet TAKE 1 TABLET BY MOUTH DAILY 09/01/21  Yes Wendie Agreste, MD  HYDROcodone-acetaminophen (NORCO) 10-325 MG tablet Take 1 tablet by mouth every 8 (eight) hours as needed (pain).    Yes [provider]  simvastatin (ZOCOR) 40 MG tablet TAKE 1 TABLET(40 MG) BY MOUTH DAILY 09/01/21  Yes Wendie Agreste, MD  ZTLIDO 1.8 % [facility] Apply 1 patch topically daily. 07/14/21  Yes [provider]  [place]. Devices [facility]) MISC 1 Device by Does not apply route as needed. Needed for low back pain, history of lumbar surgery, rollator insufficient for walking due to bursitis and back pain. Patient not taking: Reported on 12/17/2021 08/27/16   Wendie Agreste, MD  ondansetron (ZOFRAN) 4 MG tablet Take 1 tablet (4 mg total) by mouth as directed for 2 doses. Take one Zofran 4 mg tablet 30-60 minutes before each prep dose 08/04/21   Irene Shipper, MD   Social History   Socioeconomic History   Marital status: Widowed    Spouse name: Not on file   Number of children: Not on file   Years of education: Not on file   Highest education level: Not on file  Occupational History   Occupation: retired  Tobacco Use   Smoking status: Former    Packs/day: 1.00    Years: 45.00    Total pack years: 45.00    Types: Cigarettes    Quit date: 05/25/2015    Years since quitting: 6.5   Smokeless tobacco: Never  Vaping Use   Vaping Use: Never used  Substance and Sexual Activity   Alcohol use: No    Alcohol/week: 0.0 standard drinks of alcohol    Comment: 01/30/2013 "used to drink beer; none since ~ 1989"   Drug use: Never   Sexual activity: Yes    Birth control/protection: Post-menopausal  Other Topics Concern   Not on file  Social History Narrative   Not on file   Social Determinants of Health   Financial Resource Strain: Low Risk  (11/19/2021)    Overall Financial Resource Strain (CARDIA)    Difficulty of Paying Living Expenses: Not hard at all  Food Insecurity: No Food Insecurity (11/19/2021)   Hunger Vital Sign    Worried About Running Out of Food in the Last Year: Never true    [place] in the Last Year: Never true  Transportation Needs: No Transportation Needs (11/19/2021)   PRAPARE - Hydrologist (Medical): No    Lack of Transportation (Non-Medical): No  Physical Activity: Insufficiently Active (11/19/2021)   Exercise Vital Sign    Days of Exercise per Week: 2 days    Minutes of Exercise per Session: 30 min  Stress: No Stress Concern Present (11/19/2021)   [place]    Feeling of Stress : Not at all  Social Connections: Moderately Integrated (11/19/2021)   Social Connection and Isolation Panel [NHANES]    Frequency of Communication with Friends and Family: More than three times a week    Frequency of Social Gatherings with Friends and Family: More than three times a week    Attends Religious Services: More than 4 times per year    Active Member of [employer] or Organizations: Yes    Attends Archivist Meetings: More than 4 times per year    Marital Status: Widowed  Intimate Partner Violence: Not At Risk (11/19/2021)   Humiliation, Afraid, Rape, and Kick questionnaire    Fear of Current or Ex-Partner: No    Emotionally Abused: No    Physically Abused: No    Sexually Abused: No    Review of Systems Per HPI.   Objective:   Vitals:   12/17/21 1410  BP: 130/70  Pulse: 72  Temp: 98.8 F (37.1 C)  SpO2: 99%  Weight: 165 lb 6.4 oz (75 kg)  Height: '5\' 5"'$  (1.651 m)     Physical Exam Vitals reviewed.  Constitutional:      General: She is not in acute distress.    Appearance: Normal appearance. She is well-developed.  HENT:     Head: Normocephalic and atraumatic.  Cardiovascular:     Rate and Rhythm:  Normal rate.  Pulmonary:     Effort: Pulmonary effort is normal.  Skin:    Comments: Approximately half centimeter cystic-appearing area with central dark plug mid back, see photo.  No significant induration, surrounding erythema or tenderness.  No excoriation, no wounds.  Neurological:     Mental Status: She is alert and oriented to person, place, and time.  Psychiatric:        Mood and Affect: Mood normal.         Assessment &  Plan:  CLAUDETTA SALLIE is a 71 y.o. female . Epidermal cyst - Plan: Ambulatory referral to Dermatology  -Longstanding epidermal cyst with pruritus.  Does not appear infected at this time.  Refer to dermatology for possible excision.  Topical Aveeno or cortisone if needed for pruritus in the meantime.  Need for influenza vaccination - Plan: Flu Vaccine QUAD High Dose(Fluad)   No orders of the defined types were placed in this encounter.  Patient Instructions  I will refer you to dermatologist to discuss removal of what appears to be skin cyst. It does not look infected or concerning. Aveeno lotion or cortisone over the counter if needed for itching.  Return to the clinic or go to the nearest emergency room if any of your symptoms worsen or new symptoms occur.   Epidermoid Cyst  An epidermoid cyst, also known as epidermal cyst, is a sac made of skin tissue. The sac contains a substance called keratin. Keratin is a protein that is normally secreted through the hair follicles. When keratin becomes trapped in the top layer of skin (epidermis), it can form an epidermoid cyst. Epidermoid cysts can be found anywhere on your body. These cysts are usually harmless (benign), and they may not cause symptoms unless they become inflamed or infected. What are the causes? This condition may be caused by: A blocked hair follicle. A hair that curls and re-enters the skin instead of growing straight out of the skin (ingrown hair). A blocked pore. Irritated skin. An  injury to the skin. Certain conditions that are passed along from parent to child (inherited). Human papillomavirus (HPV). This happens rarely when cysts occur on the bottom of the feet. Long-term (chronic) sun damage to the skin. What increases the risk? The following factors may make you more likely to develop an epidermoid cyst: Having acne. Being female. Having an injury to the skin. Being past puberty. Having certain rare genetic disorders. What are the signs or symptoms? The only symptom of this condition may be a small, painless lump underneath the skin. When an epidermal cyst ruptures, it may become inflamed. True infection in cysts is rare. Symptoms may include: Redness. Inflammation. Tenderness. Warmth. Keratin draining from the cyst. Keratin is grayish-white, bad-smelling substance. Pus draining from the cyst. How is this diagnosed? This condition is diagnosed with a physical exam. In some cases, you may have a sample of tissue (biopsy) taken from your cyst to be examined under a microscope or tested for bacteria. You may be referred to a health care provider who specializes in skin care (dermatologist). How is this treated? If a cyst becomes inflamed, treatment may include: Opening and draining the cyst, done by a health care provider. After draining, minor surgery to remove the rest of the cyst may be done. Taking antibiotic medicine. Having injections of medicines (steroids) that help to reduce inflammation. Having surgery to remove the cyst. Surgery may be done if the cyst: Becomes large. Bothers you. Has a chance of turning into cancer. Do not try to open a cyst yourself. Follow these instructions at home: Medicines If you were prescribed an antibiotic medicine, take it it as told by your health care provider. Do not stop using the antibiotic even if you start to feel better. Take over-the-counter and prescription medicines only as told by your health care  provider. General instructions Keep the area around your cyst clean and dry. Wear loose, dry clothing. Avoid touching your cyst. Check your cyst every day for signs of  infection. Check for: Redness, swelling, or pain. Fluid or blood. Warmth. Pus or a bad smell. Keep all follow-up visits. This is important. How is this prevented? Wear clean, dry, clothing. Avoid wearing tight clothing. Keep your skin clean and dry. Take showers or baths every day. Contact a health care provider if: Your cyst develops symptoms of infection. Your condition is not improving or is getting worse. You develop a cyst that looks different from other cysts you have had. You have a fever. Get help right away if: Redness spreads from the cyst into the surrounding area. Summary An epidermoid cyst is a sac made of skin tissue. These cysts are usually harmless (benign), and they may not cause symptoms unless they become inflamed. If a cyst becomes inflamed, treatment may include surgery to open and drain the cyst, or to remove it. Treatment may also include medicines by mouth or through an injection. Take over-the-counter and prescription medicines only as told by your health care provider. If you were prescribed an antibiotic medicine, take it as told by your health care provider. Do not stop using the antibiotic even if you start to feel better. Contact a health care provider if your condition is not improving or is getting worse. Keep all follow-up visits as told by your health care provider. This is important. This information is not intended to replace advice given to you by your health care provider. Make sure you discuss any questions you have with your health care provider. Document Revised: 05/03/2019 Document Reviewed: 05/03/2019 Elsevier Patient Education  [place],   Merri Ray, MD [place] 12/17/21 2:51  PM

## 2021-12-17 NOTE — Patient Instructions (Signed)
I will refer you to dermatologist to discuss removal of what appears to be skin cyst. It does not look infected or concerning. Aveeno lotion or cortisone over the counter if needed for itching.  Return to the clinic or go to the nearest emergency room if any of your symptoms worsen or new symptoms occur.   Epidermoid Cyst  An epidermoid cyst, also known as epidermal cyst, is a sac made of skin tissue. The sac contains a substance called keratin. Keratin is a protein that is normally secreted through the hair follicles. When keratin becomes trapped in the top layer of skin (epidermis), it can form an epidermoid cyst. Epidermoid cysts can be found anywhere on your body. These cysts are usually harmless (benign), and they may not cause symptoms unless they become inflamed or infected. What are the causes? This condition may be caused by: A blocked hair follicle. A hair that curls and re-enters the skin instead of growing straight out of the skin (ingrown hair). A blocked pore. Irritated skin. An injury to the skin. Certain conditions that are passed along from parent to child (inherited). Human papillomavirus (HPV). This happens rarely when cysts occur on the bottom of the feet. Long-term (chronic) sun damage to the skin. What increases the risk? The following factors may make you more likely to develop an epidermoid cyst: Having acne. Being female. Having an injury to the skin. Being past puberty. Having certain rare genetic disorders. What are the signs or symptoms? The only symptom of this condition may be a small, painless lump underneath the skin. When an epidermal cyst ruptures, it may become inflamed. True infection in cysts is rare. Symptoms may include: Redness. Inflammation. Tenderness. Warmth. Keratin draining from the cyst. Keratin is grayish-white, bad-smelling substance. Pus draining from the cyst. How is this diagnosed? This condition is diagnosed with a physical exam. In  some cases, you may have a sample of tissue (biopsy) taken from your cyst to be examined under a microscope or tested for bacteria. You may be referred to a health care provider who specializes in skin care (dermatologist). How is this treated? If a cyst becomes inflamed, treatment may include: Opening and draining the cyst, done by a health care provider. After draining, minor surgery to remove the rest of the cyst may be done. Taking antibiotic medicine. Having injections of medicines (steroids) that help to reduce inflammation. Having surgery to remove the cyst. Surgery may be done if the cyst: Becomes large. Bothers you. Has a chance of turning into cancer. Do not try to open a cyst yourself. Follow these instructions at home: Medicines If you were prescribed an antibiotic medicine, take it it as told by your health care provider. Do not stop using the antibiotic even if you start to feel better. Take over-the-counter and prescription medicines only as told by your health care provider. General instructions Keep the area around your cyst clean and dry. Wear loose, dry clothing. Avoid touching your cyst. Check your cyst every day for signs of infection. Check for: Redness, swelling, or pain. Fluid or blood. Warmth. Pus or a bad smell. Keep all follow-up visits. This is important. How is this prevented? Wear clean, dry, clothing. Avoid wearing tight clothing. Keep your skin clean and dry. Take showers or baths every day. Contact a health care provider if: Your cyst develops symptoms of infection. Your condition is not improving or is getting worse. You develop a cyst that looks different from other cysts you have had. You  have a fever. Get help right away if: Redness spreads from the cyst into the surrounding area. Summary An epidermoid cyst is a sac made of skin tissue. These cysts are usually harmless (benign), and they may not cause symptoms unless they become inflamed. If  a cyst becomes inflamed, treatment may include surgery to open and drain the cyst, or to remove it. Treatment may also include medicines by mouth or through an injection. Take over-the-counter and prescription medicines only as told by your health care provider. If you were prescribed an antibiotic medicine, take it as told by your health care provider. Do not stop using the antibiotic even if you start to feel better. Contact a health care provider if your condition is not improving or is getting worse. Keep all follow-up visits as told by your health care provider. This is important. This information is not intended to replace advice given to you by your health care provider. Make sure you discuss any questions you have with your health care provider. Document Revised: 05/03/2019 Document Reviewed: 05/03/2019 Elsevier Patient Education  [place].

## 2021-12-18 ENCOUNTER — Encounter: Payer: Self-pay | Admitting: Family Medicine

## 2022-01-12 DIAGNOSIS — M961 Postlaminectomy syndrome, not elsewhere classified: Secondary | ICD-10-CM | POA: Diagnosis not present

## 2022-01-12 DIAGNOSIS — Z6826 Body mass index (BMI) 26.0-26.9, adult: Secondary | ICD-10-CM | POA: Diagnosis not present

## 2022-01-12 DIAGNOSIS — M5414 Radiculopathy, thoracic region: Secondary | ICD-10-CM | POA: Diagnosis not present

## 2022-01-12 DIAGNOSIS — M5416 Radiculopathy, lumbar region: Secondary | ICD-10-CM | POA: Diagnosis not present

## 2022-01-21 DIAGNOSIS — M961 Postlaminectomy syndrome, not elsewhere classified: Secondary | ICD-10-CM | POA: Diagnosis not present

## 2022-01-21 DIAGNOSIS — M545 Low back pain, unspecified: Secondary | ICD-10-CM | POA: Diagnosis not present

## 2022-01-21 DIAGNOSIS — M5124 Other intervertebral disc displacement, thoracic region: Secondary | ICD-10-CM | POA: Diagnosis not present

## 2022-01-21 DIAGNOSIS — M5414 Radiculopathy, thoracic region: Secondary | ICD-10-CM | POA: Diagnosis not present

## 2022-02-27 ENCOUNTER — Other Ambulatory Visit: Payer: Medicare HMO

## 2022-02-27 DIAGNOSIS — L708 Other acne: Secondary | ICD-10-CM | POA: Diagnosis not present

## 2022-03-06 ENCOUNTER — Ambulatory Visit: Payer: Medicare HMO | Admitting: Family Medicine

## 2022-03-09 ENCOUNTER — Encounter: Payer: Self-pay | Admitting: Family Medicine

## 2022-03-09 ENCOUNTER — Ambulatory Visit (INDEPENDENT_AMBULATORY_CARE_PROVIDER_SITE_OTHER): Payer: Medicare HMO | Admitting: Family Medicine

## 2022-03-09 VITALS — BP 118/68 | HR 64 | Temp 98.1°F | Resp 17 | Ht 65.0 in | Wt 165.1 lb

## 2022-03-09 DIAGNOSIS — R7303 Prediabetes: Secondary | ICD-10-CM | POA: Diagnosis not present

## 2022-03-09 DIAGNOSIS — E785 Hyperlipidemia, unspecified: Secondary | ICD-10-CM

## 2022-03-09 DIAGNOSIS — I10 Essential (primary) hypertension: Secondary | ICD-10-CM

## 2022-03-09 LAB — COMPREHENSIVE METABOLIC PANEL
ALT: 32 U/L (ref 0–35)
AST: 33 U/L (ref 0–37)
Albumin: 4.5 g/dL (ref 3.5–5.2)
Alkaline Phosphatase: 69 U/L (ref 39–117)
BUN: 18 mg/dL (ref 6–23)
CO2: 28 mEq/L (ref 19–32)
Calcium: 10.5 mg/dL (ref 8.4–10.5)
Chloride: 103 mEq/L (ref 96–112)
Creatinine, Ser: 0.68 mg/dL (ref 0.40–1.20)
GFR: 88.03 mL/min (ref >60.00)
Glucose, Bld: 101 mg/dL — ABNORMAL HIGH (ref 70–99)
Potassium: 4.2 mEq/L (ref 3.5–5.1)
Sodium: 140 mEq/L (ref 135–145)
Total Bilirubin: 0.4 mg/dL (ref 0.2–1.2)
Total Protein: 7.9 g/dL (ref 6.0–8.3)

## 2022-03-09 LAB — LIPID PANEL
Cholesterol: 132 mg/dL (ref 0–200)
HDL: 58 mg/dL (ref >39.00)
LDL Cholesterol: 64 mg/dL (ref 0–99)
NonHDL: 74.08
Total CHOL/HDL Ratio: 2
Triglycerides: 51 mg/dL (ref 0.0–149.0)
VLDL: 10.2 mg/dL (ref 0.0–40.0)

## 2022-03-09 LAB — HEMOGLOBIN A1C: Hgb A1c MFr Bld: 6.3 % (ref 4.6–6.5)

## 2022-03-09 MED ORDER — SIMVASTATIN 40 MG PO TABS: ORAL_TABLET | ORAL | 1 refills | Status: DC

## 2022-03-09 MED ORDER — HYDROCHLOROTHIAZIDE 25 MG PO TABS: ORAL_TABLET | ORAL | 1 refills | Status: DC

## 2022-03-09 NOTE — Progress Notes (Signed)
Subjective:  Patient ID: [name], female    DOB: 05-14-1950  Age: 72 y.o. MRN: [id number]  CC:  Chief Complaint  Patient presents with   Hyperlipidemia    6 month follow up on Hyperlipidemia  Doing well per pt     HPI [name] presents for   Follow up.  Saw dermatology - no procedure needed.   Hyperlipidemia: Simvastatin 40 mg daily, no new myalgias/side effects.  Lab Results  Component Value Date   CHOL 144 09/01/2021   HDL 55.30 09/01/2021   LDLCALC 73 09/01/2021   TRIG 76.0 09/01/2021   CHOLHDL 3 09/01/2021   Lab Results  Component Value Date   ALT 23 09/01/2021   AST 27 09/01/2021   ALKPHOS 68 09/01/2021   BILITOT 0.4 09/01/2021   Hypertension: Hydrochlorothiazide 25 mg daily, no new side effects.  Home readings: none.  BP Readings from Last 3 Encounters:  03/09/22 118/68  12/17/21 130/70  09/01/21 128/60   Lab Results  Component Value Date   CREATININE 0.61 09/01/2021   Postlaminectomy syndrome with thoracic radiculopathy Has been seen by neurosurgery previously, Dr. Saintclair Halsted, Ortho Dr. Marlou Sa.  Gabapentin, hydrocodone managing pain and nerve sx's- followed by pain management.   Prediabetes: diet/exercise approach.  Weight stable. Active with volunteer work. Some Pepsi at times, has cut back.  Lab Results  Component Value Date   HGBA1C 6.3 09/01/2021   Wt Readings from Last 3 Encounters:  03/09/22 165 lb 2 oz (74.9 kg)  12/17/21 165 lb 6.4 oz (75 kg)  11/19/21 160 lb (72.6 kg)      History Patient Active Problem List   Diagnosis Date Noted   Pre-diabetes 01/30/2020   Trochanteric bursitis, left hip 07/02/2016   Essential hypertension 03/16/2016   Hyperlipidemia 03/16/2016   SBO (small bowel obstruction) ([place]) 01/30/2013   Degenerative arthritis of left hip 01/24/2013   Status post THR (total hip replacement) 01/24/2013   Past Medical History:  Diagnosis Date   Arthritis 08/04/2021   left shoulder   Chronic bronchitis ([place])     "when I get a cold I normally get bronchitis" (01/30/2013)   Hepatitis C    treated and states negative now on 08/04/21   Hyperlipidemia    Hypertension    Past Surgical History:  Procedure Laterality Date   ABDOMINAL HYSTERECTOMY  10/10/1988   BACK SURGERY     x5   CHOLECYSTECTOMY  10/10/1988   COLONOSCOPY  06/2015   JOINT REPLACEMENT Left 01/24/2013   hip   LUMBAR SPINE SURGERY  2010; 2011   "tightened up some screws" (01/30/2013)   POSTERIOR LUMBAR FUSION  2006 X 2; 2008   TOTAL HIP ARTHROPLASTY Left 01/24/2013   Procedure: LEFT TOTAL HIP ARTHROPLASTY ANTERIOR APPROACH;  Surgeon: Mcarthur Rossetti, MD;  Location: [facility];  Service: Orthopedics;  Laterality: Left;   TUBAL LIGATION  10/10/1968   Allergies  Allergen Reactions   No Known Allergies    Prior to Admission medications   Medication Sig Start Date End Date Taking? Authorizing Provider  aspirin 81 MG tablet Take 81 mg by mouth daily.   Yes [provider]  gabapentin (NEURONTIN) 100 MG capsule Take 100 mg by mouth daily. 03/05/20  Yes [provider]  HYDROcodone-acetaminophen (NORCO) 10-325 MG tablet Take 1 tablet by mouth every 8 (eight) hours as needed (pain).    Yes [provider]  Misc. Devices [facility]) MISC 1 Device by Does not apply route as needed. Needed  for low back pain, history of lumbar surgery, rollator insufficient for walking due to bursitis and back pain. 08/27/16  Yes Wendie Agreste, MD  ZTLIDO 1.8 % [facility] Apply 1 patch topically daily. 07/14/21  Yes [provider]  hydrochlorothiazide (HYDRODIURIL) 25 MG tablet TAKE 1 TABLET BY MOUTH DAILY 03/09/22   Wendie Agreste, MD  simvastatin (ZOCOR) 40 MG tablet TAKE 1 TABLET(40 MG) BY MOUTH DAILY 03/09/22   Wendie Agreste, MD   Social History   Socioeconomic History   Marital status: Widowed    Spouse name: Not on file   Number of children: Not on file   Years of education: Not on file   Highest education  level: Not on file  Occupational History   Occupation: retired  Tobacco Use   Smoking status: Former    Packs/day: 1.00    Years: 45.00    Total pack years: 45.00    Types: Cigarettes    Quit date: 05/25/2015    Years since quitting: 6.7   Smokeless tobacco: Never  Vaping Use   Vaping Use: Never used  Substance and Sexual Activity   Alcohol use: No    Alcohol/week: 0.0 standard drinks of alcohol    Comment: 01/30/2013 "used to drink beer; none since ~ 1989"   Drug use: Never   Sexual activity: Yes    Birth control/protection: Post-menopausal  Other Topics Concern   Not on file  Social History Narrative   Not on file   Social Determinants of Health   Financial Resource Strain: Low Risk  (11/19/2021)   Overall Financial Resource Strain (CARDIA)    Difficulty of Paying Living Expenses: Not hard at all  Food Insecurity: No Food Insecurity (11/19/2021)   Hunger Vital Sign    Worried About Running Out of Food in the Last Year: Never true    [place] in the Last Year: Never true  Transportation Needs: No Transportation Needs (11/19/2021)   PRAPARE - Hydrologist (Medical): No    Lack of Transportation (Non-Medical): No  Physical Activity: Insufficiently Active (11/19/2021)   Exercise Vital Sign    Days of Exercise per Week: 2 days    Minutes of Exercise per Session: 30 min  Stress: No Stress Concern Present (11/19/2021)   [place]    Feeling of Stress : Not at all  Social Connections: Moderately Integrated (11/19/2021)   Social Connection and Isolation Panel [NHANES]    Frequency of Communication with Friends and Family: More than three times a week    Frequency of Social Gatherings with Friends and Family: More than three times a week    Attends Religious Services: More than 4 times per year    Active Member of [employer] or Organizations: Yes    Attends Archivist  Meetings: More than 4 times per year    Marital Status: Widowed  Intimate Partner Violence: Not At Risk (11/19/2021)   Humiliation, Afraid, Rape, and Kick questionnaire    Fear of Current or Ex-Partner: No    Emotionally Abused: No    Physically Abused: No    Sexually Abused: No    Review of Systems  Constitutional:  Negative for fatigue and unexpected weight change.  Respiratory:  Negative for chest tightness and shortness of breath.   Cardiovascular:  Negative for chest pain, palpitations and leg swelling.  Gastrointestinal:  Negative for abdominal pain and blood in stool.  Neurological:  Negative for dizziness, syncope, light-headedness and headaches.     Objective:   Vitals:   03/09/22 0838  BP: 118/68  Pulse: 64  Resp: 17  Temp: 98.1 F (36.7 C)  TempSrc: Oral  SpO2: 96%  Weight: 165 lb 2 oz (74.9 kg)  Height: '5\' 5"'$  (1.651 m)     Physical Exam     Assessment & Plan:  [name] is a 71 y.o. female . Hyperlipidemia, unspecified hyperlipidemia type - Plan: Lipid Profile, simvastatin (ZOCOR) 40 MG tablet  -  Stable, tolerating current regimen. Medications refilled. Labs pending as above.   Prediabetes - Plan: HgB A1c  -Continue to watch diet, cut back on Pepsi, check updated A1c.  Weight stable.  Essential hypertension - Plan: Comp Met (CMET), hydrochlorothiazide (HYDRODIURIL) 25 MG tablet  -  Stable, tolerating current regimen. Medications refilled. Labs pending as above.    Meds ordered this encounter  Medications   hydrochlorothiazide (HYDRODIURIL) 25 MG tablet    Sig: TAKE 1 TABLET BY MOUTH DAILY    Dispense:  90 tablet    Refill:  1   simvastatin (ZOCOR) 40 MG tablet    Sig: TAKE 1 TABLET(40 MG) BY MOUTH DAILY    Dispense:  90 tablet    Refill:  1   Patient Instructions  Thank you for coming in today.  I will check your labs but no med changes for now.  If any concerns please let me know.  77-monthfollow-up but happy to see you sooner if  needed.  Take care!    Signed,   JMerri Ray MD [facility] 03/09/22 9:11 AM

## 2022-03-09 NOTE — Patient Instructions (Signed)
Thank you for coming in today.  I will check your labs but no med changes for now.  If any concerns please let me know.  46-monthfollow-up but happy to see you sooner if needed.  Take care!

## 2022-03-12 NOTE — Progress Notes (Signed)
This encounter was created in error - please disregard.

## 2022-03-13 DIAGNOSIS — M546 Pain in thoracic spine: Secondary | ICD-10-CM | POA: Diagnosis not present

## 2022-03-13 DIAGNOSIS — M25512 Pain in left shoulder: Secondary | ICD-10-CM | POA: Diagnosis not present

## 2022-03-13 DIAGNOSIS — M5414 Radiculopathy, thoracic region: Secondary | ICD-10-CM | POA: Diagnosis not present

## 2022-03-13 DIAGNOSIS — Z6826 Body mass index (BMI) 26.0-26.9, adult: Secondary | ICD-10-CM | POA: Diagnosis not present

## 2022-03-13 DIAGNOSIS — M961 Postlaminectomy syndrome, not elsewhere classified: Secondary | ICD-10-CM | POA: Diagnosis not present

## 2022-03-13 DIAGNOSIS — M5416 Radiculopathy, lumbar region: Secondary | ICD-10-CM | POA: Diagnosis not present

## 2022-03-27 DIAGNOSIS — M5414 Radiculopathy, thoracic region: Secondary | ICD-10-CM | POA: Diagnosis not present

## 2022-04-15 ENCOUNTER — Emergency Department (HOSPITAL_BASED_OUTPATIENT_CLINIC_OR_DEPARTMENT_OTHER)
Admission: EM | Admit: 2022-04-15 | Discharge: 2022-04-15 | Disposition: A | Discharge status: Home / Self Care | Payer: Medicare HMO | Attending: Emergency Medicine | Admitting: Emergency Medicine

## 2022-04-15 ENCOUNTER — Other Ambulatory Visit: Payer: Self-pay

## 2022-04-15 ENCOUNTER — Encounter (HOSPITAL_BASED_OUTPATIENT_CLINIC_OR_DEPARTMENT_OTHER): Payer: Self-pay | Admitting: Emergency Medicine

## 2022-04-15 DIAGNOSIS — M79605 Pain in left leg: Secondary | ICD-10-CM

## 2022-04-15 MED ORDER — METHOCARBAMOL 500 MG PO TABS: 250.0000 mg | ORAL_TABLET | Freq: Three times a day (TID) | ORAL | 0 refills | Status: AC | PRN

## 2022-04-15 NOTE — ED Triage Notes (Signed)
Pt reports left thigh pain anteriorly and posteriorly. Reports "it feels like a charlie horse." Pt stating she did a lot of bending over yesterday. Distal pulses present.

## 2022-04-15 NOTE — ED Provider Notes (Signed)
[facility] Provider Note   CSN: [id number] Arrival date & time: 04/15/22  [id number]     History  Chief Complaint  Patient presents with   Leg Pain    [name] is a 72 y.o. female.   Leg Pain Patient presents with left thigh pain.  Began yesterday while she was working.  States she was stooping to pick something up repeatedly.  States she then felt some acute pain in her left thigh.  Worse with certain movements.  States the pain was in the front but also feels that the back could tighten up.  No other muscle contractions.  No numbness weakness.  Does have chronic pain but states not on this leg.    Past Medical History:  Diagnosis Date   Arthritis 08/04/2021   left shoulder   Chronic bronchitis ([place])    "when I get a cold I normally get bronchitis" (01/30/2013)   Hepatitis C    treated and states negative now on 08/04/21   Hyperlipidemia    Hypertension     Home Medications Prior to Admission medications   Medication Sig Start Date End Date Taking? Authorizing Provider  methocarbamol (ROBAXIN) 500 MG tablet Take 0.5-1 tablets (250-500 mg total) by mouth every 8 (eight) hours as needed for muscle spasms. 04/15/22  Yes Davonna Belling, MD  aspirin 81 MG tablet Take 81 mg by mouth daily.    [provider]  gabapentin (NEURONTIN) 100 MG capsule Take 100 mg by mouth daily. 03/05/20   [provider]  hydrochlorothiazide (HYDRODIURIL) 25 MG tablet TAKE 1 TABLET BY MOUTH DAILY 03/09/22   Wendie Agreste, MD  HYDROcodone-acetaminophen [facility]) 10-325 MG tablet Take 1 tablet by mouth every 8 (eight) hours as needed (pain).     [provider]  Misc. Devices [facility]) MISC 1 Device by Does not apply route as needed. Needed for low back pain, history of lumbar surgery, rollator insufficient for walking due to bursitis and back pain. 08/27/16   Wendie Agreste, MD  simvastatin (ZOCOR) 40 MG tablet TAKE 1  TABLET(40 MG) BY MOUTH DAILY 03/09/22   Wendie Agreste, MD  ZTLIDO 1.8 % [facility] Apply 1 patch topically daily. 07/14/21   [provider]      Allergies    No known allergies    Review of Systems   Review of Systems  Physical Exam Updated Vital Signs BP (!) 151/71   Pulse 60   Temp 98.6 F (37 C) (Oral)   Resp 17   SpO2 96%  Physical Exam Vitals and nursing note reviewed.  Musculoskeletal:        General: Tenderness present.     Comments: Mild tenderness to anterior left thigh.  Good straight leg raise.  Neurovascular intact distally.  Neurological:     Mental Status: She is alert.     ED Results / Procedures / Treatments   Labs (all labs ordered are listed, but only abnormal results are displayed) Labs Reviewed - No data to display  EKG None  Radiology No results found.  Procedures Procedures    Medications Ordered in ED Medications - No data to display  ED Course/ Medical Decision Making/ A&P                             Medical Decision Making  Patient with left thigh pain.  Also some feeling of almost having a  contraction on the left hamstring area.  Began acutely when bending over.  I think most likely musculoskeletal pain.  Doubt DVT.  No recent travel no recent immobility.  Doubt severe electrolyte abnormality.  Will treat symptomatically with muscle relaxers.  Discussed with patient's risk and benefits.  Will start low-dose.  Appears stable for discharge home.        Final Clinical Impression(s) / ED Diagnoses Final diagnoses:  Left leg pain    Rx / DC Orders ED Discharge Orders          Ordered    methocarbamol (ROBAXIN) 500 MG tablet  Every 8 hours PRN        04/15/22 [id number]              Davonna Belling, MD 04/15/22 [phone]

## 2022-04-23 DIAGNOSIS — Z6826 Body mass index (BMI) 26.0-26.9, adult: Secondary | ICD-10-CM | POA: Diagnosis not present

## 2022-04-23 DIAGNOSIS — M546 Pain in thoracic spine: Secondary | ICD-10-CM | POA: Diagnosis not present

## 2022-04-23 DIAGNOSIS — M961 Postlaminectomy syndrome, not elsewhere classified: Secondary | ICD-10-CM | POA: Diagnosis not present

## 2022-04-23 DIAGNOSIS — M5416 Radiculopathy, lumbar region: Secondary | ICD-10-CM | POA: Diagnosis not present

## 2022-04-23 DIAGNOSIS — M5414 Radiculopathy, thoracic region: Secondary | ICD-10-CM | POA: Diagnosis not present

## 2022-04-23 DIAGNOSIS — M542 Cervicalgia: Secondary | ICD-10-CM | POA: Diagnosis not present

## 2022-07-10 ENCOUNTER — Ambulatory Visit
Admission: RE | Admit: 2022-07-10 | Discharge: 2022-07-10 | Disposition: A | Discharge status: Home / Self Care | Payer: Medicare HMO | Source: Ambulatory Visit | Attending: Family Medicine | Admitting: Family Medicine

## 2022-07-10 DIAGNOSIS — Z90710 Acquired absence of both cervix and uterus: Secondary | ICD-10-CM | POA: Diagnosis not present

## 2022-07-10 DIAGNOSIS — R29890 Loss of height: Secondary | ICD-10-CM | POA: Diagnosis not present

## 2022-07-10 DIAGNOSIS — M810 Age-related osteoporosis without current pathological fracture: Secondary | ICD-10-CM | POA: Diagnosis not present

## 2022-07-10 DIAGNOSIS — Z90722 Acquired absence of ovaries, bilateral: Secondary | ICD-10-CM | POA: Diagnosis not present

## 2022-07-10 DIAGNOSIS — N951 Menopausal and female climacteric states: Secondary | ICD-10-CM | POA: Diagnosis not present

## 2022-07-10 DIAGNOSIS — M8580 Other specified disorders of bone density and structure, unspecified site: Secondary | ICD-10-CM

## 2022-07-10 DIAGNOSIS — E349 Endocrine disorder, unspecified: Secondary | ICD-10-CM | POA: Diagnosis not present

## 2022-08-03 DIAGNOSIS — H524 Presbyopia: Secondary | ICD-10-CM | POA: Diagnosis not present

## 2022-08-03 DIAGNOSIS — H5213 Myopia, bilateral: Secondary | ICD-10-CM | POA: Diagnosis not present

## 2022-08-03 DIAGNOSIS — H2513 Age-related nuclear cataract, bilateral: Secondary | ICD-10-CM | POA: Diagnosis not present

## 2022-08-03 DIAGNOSIS — H52223 Regular astigmatism, bilateral: Secondary | ICD-10-CM | POA: Diagnosis not present

## 2022-08-03 DIAGNOSIS — H04123 Dry eye syndrome of bilateral lacrimal glands: Secondary | ICD-10-CM | POA: Diagnosis not present

## 2022-08-05 DIAGNOSIS — H524 Presbyopia: Secondary | ICD-10-CM | POA: Diagnosis not present

## 2022-08-05 DIAGNOSIS — H52223 Regular astigmatism, bilateral: Secondary | ICD-10-CM | POA: Diagnosis not present

## 2022-09-07 ENCOUNTER — Ambulatory Visit (INDEPENDENT_AMBULATORY_CARE_PROVIDER_SITE_OTHER): Payer: Medicare HMO | Admitting: Family Medicine

## 2022-09-07 ENCOUNTER — Encounter: Payer: Self-pay | Admitting: Family Medicine

## 2022-09-07 VITALS — BP 124/66 | HR 55 | Temp 97.8°F | Ht 65.0 in | Wt 163.8 lb

## 2022-09-07 DIAGNOSIS — Z87891 Personal history of nicotine dependence: Secondary | ICD-10-CM

## 2022-09-07 DIAGNOSIS — E785 Hyperlipidemia, unspecified: Secondary | ICD-10-CM | POA: Diagnosis not present

## 2022-09-07 DIAGNOSIS — R7303 Prediabetes: Secondary | ICD-10-CM | POA: Diagnosis not present

## 2022-09-07 DIAGNOSIS — I10 Essential (primary) hypertension: Secondary | ICD-10-CM | POA: Diagnosis not present

## 2022-09-07 LAB — COMPREHENSIVE METABOLIC PANEL
ALT: 28 U/L (ref 0–35)
AST: 33 U/L (ref 0–37)
Albumin: 4.5 g/dL (ref 3.5–5.2)
Alkaline Phosphatase: 67 U/L (ref 39–117)
BUN: 20 mg/dL (ref 6–23)
CO2: 29 mEq/L (ref 19–32)
Calcium: 10.4 mg/dL (ref 8.4–10.5)
Chloride: 103 mEq/L (ref 96–112)
Creatinine, Ser: 0.63 mg/dL (ref 0.40–1.20)
GFR: 89.36 mL/min (ref >60.00)
Glucose, Bld: 94 mg/dL (ref 70–99)
Potassium: 4.2 mEq/L (ref 3.5–5.1)
Sodium: 139 mEq/L (ref 135–145)
Total Bilirubin: 0.5 mg/dL (ref 0.2–1.2)
Total Protein: 7.8 g/dL (ref 6.0–8.3)

## 2022-09-07 LAB — LIPID PANEL
Cholesterol: 122 mg/dL (ref 0–200)
HDL: 48 mg/dL (ref >39.00)
LDL Cholesterol: 62 mg/dL (ref 0–99)
NonHDL: 74.33
Total CHOL/HDL Ratio: 3
Triglycerides: 60 mg/dL (ref 0.0–149.0)
VLDL: 12 mg/dL (ref 0.0–40.0)

## 2022-09-07 LAB — HEMOGLOBIN A1C: Hgb A1c MFr Bld: 6.2 % (ref 4.6–6.5)

## 2022-09-07 MED ORDER — SIMVASTATIN 40 MG PO TABS: ORAL_TABLET | ORAL | 1 refills | Status: DC

## 2022-09-07 MED ORDER — HYDROCHLOROTHIAZIDE 25 MG PO TABS: ORAL_TABLET | ORAL | 1 refills | Status: DC

## 2022-09-07 NOTE — Patient Instructions (Signed)
No change in medications at this time.  I will check your labs and let you know if any changes are needed.  I will refer you to [facility] again to discuss lung cancer screening.  If needed their phone number is [phone]. Take care!

## 2022-09-07 NOTE — Progress Notes (Signed)
Subjective:  Patient ID: [name], female    DOB: 12/06/1950  Age: 72 y.o. MRN: [id number]  CC:  Chief Complaint  Patient presents with   Medical Management of Chronic Issues    Pt is doing well     HPI [name] presents for follow up - no health changes.   Hyperlipidemia: Simvastatin 40 mg daily without new myalgias/side effects with this medication. Lab Results  Component Value Date   CHOL 132 03/09/2022   HDL 58.00 03/09/2022   LDLCALC 64 03/09/2022   TRIG 51.0 03/09/2022   CHOLHDL 2 03/09/2022   Lab Results  Component Value Date   ALT 32 03/09/2022   AST 33 03/09/2022   ALKPHOS 69 03/09/2022   BILITOT 0.4 03/09/2022   Hypertension: Hydrochlorothiazide 25 mg daily.  Elevated reading March 6 but stable today.  No new side effects with meds. Home readings: 141/80, 136/80. 121/80.  HA last week, resolved next day, no current HA.  BP Readings from Last 3 Encounters:  09/07/22 124/66  04/15/22 (!) 151/71  03/09/22 118/68   Lab Results  Component Value Date   CREATININE 0.68 03/09/2022   Prediabetes: Diet/exercise approach.  Weight has improved 2 pounds Lab Results  Component Value Date   HGBA1C 6.3 03/09/2022   Wt Readings from Last 3 Encounters:  09/07/22 163 lb 12.8 oz (74.3 kg)  03/09/22 165 lb 2 oz (74.9 kg)  12/17/21 165 lb 6.4 oz (75 kg)   Followed by pain management for postlaminectomy syndrome with thoracic radiculopathy, treated with hydrocodone, gabapentin.  Health maintenance Lung cancer screening, referred for screening in July 2023.  Former smoker, 1 pack/day for approximately 45 years and quit in 2017.  Appears messages were left for patient and letter mailed to her home last year. Agrees to repeat referral.   History Patient Active Problem List   Diagnosis Date Noted   Pre-diabetes 01/30/2020   Trochanteric bursitis, left hip 07/02/2016   Essential hypertension 03/16/2016   Hyperlipidemia 03/16/2016   SBO (small bowel  obstruction) (HCC) 01/30/2013   Degenerative arthritis of left hip 01/24/2013   Status post THR (total hip replacement) 01/24/2013   Past Medical History:  Diagnosis Date   Arthritis 08/04/2021   left shoulder   Chronic bronchitis (HCC)    "when I get a cold I normally get bronchitis" (01/30/2013)   Hepatitis C    treated and states negative now on 08/04/21   Hyperlipidemia    Hypertension    Past Surgical History:  Procedure Laterality Date   ABDOMINAL HYSTERECTOMY  10/10/1988   BACK SURGERY     x5   CHOLECYSTECTOMY  10/10/1988   COLONOSCOPY  06/2015   JOINT REPLACEMENT Left 01/24/2013   hip   LUMBAR SPINE SURGERY  2010; 2011   "tightened up some screws" (01/30/2013)   POSTERIOR LUMBAR FUSION  2006 X 2; 2008   TOTAL HIP ARTHROPLASTY Left 01/24/2013   Procedure: LEFT TOTAL HIP ARTHROPLASTY ANTERIOR APPROACH;  Surgeon: Kathryne Hitch, MD;  Location: [facility] OR;  Service: Orthopedics;  Laterality: Left;   TUBAL LIGATION  10/10/1968   Allergies  Allergen Reactions   No Known Allergies    Prior to Admission medications   Medication Sig Start Date End Date Taking? Authorizing Provider  aspirin 81 MG tablet Take 81 mg by mouth daily.   Yes [provider]  gabapentin (NEURONTIN) 100 MG capsule Take 100 mg by mouth daily. 03/05/20  Yes [provider]  hydrochlorothiazide (HYDRODIURIL)  25 MG tablet TAKE 1 TABLET BY MOUTH DAILY 03/09/22  Yes Shade Flood, MD  HYDROcodone-acetaminophen [facility]) 10-325 MG tablet Take 1 tablet by mouth every 8 (eight) hours as needed (pain).    Yes [provider]  methocarbamol (ROBAXIN) 500 MG tablet Take 0.5-1 tablets (250-500 mg total) by mouth every 8 (eight) hours as needed for muscle spasms. 04/15/22  Yes Benjiman Core, MD  Misc. Devices [facility]) MISC 1 Device by Does not apply route as needed. Needed for low back pain, history of lumbar surgery, rollator insufficient for walking due to bursitis and back  pain. 08/27/16  Yes Shade Flood, MD  Multiple Vitamins-Minerals (MULTIVITAMIN WOMEN 50+ PO) Take 1 tablet by mouth daily.   Yes [provider]  simvastatin (ZOCOR) 40 MG tablet TAKE 1 TABLET(40 MG) BY MOUTH DAILY 03/09/22  Yes Shade Flood, MD  ZTLIDO 1.8 % [facility] Apply 1 patch topically daily. 07/14/21  Yes [provider]   Social History   Socioeconomic History   Marital status: Widowed    Spouse name: Not on file   Number of children: Not on file   Years of education: Not on file   Highest education level: Not on file  Occupational History   Occupation: retired  Tobacco Use   Smoking status: Former    Current packs/day: 0.00    Average packs/day: 1 pack/day for 45.0 years (45.0 ttl pk-yrs)    Types: Cigarettes    Start date: 05/25/1970    Quit date: 05/25/2015    Years since quitting: 7.2   Smokeless tobacco: Never  Vaping Use   Vaping status: Never Used  Substance and Sexual Activity   Alcohol use: No    Alcohol/week: 0.0 standard drinks of alcohol    Comment: 01/30/2013 "used to drink beer; none since ~ 1989"   Drug use: Never   Sexual activity: Yes    Birth control/protection: Post-menopausal  Other Topics Concern   Not on file  Social History Narrative   Not on file   Social Determinants of Health   Financial Resource Strain: Low Risk  (11/19/2021)   Overall Financial Resource Strain (CARDIA)    Difficulty of Paying Living Expenses: Not hard at all  Food Insecurity: No Food Insecurity (11/19/2021)   Hunger Vital Sign    Worried About Running Out of Food in the Last Year: Never true    Ran Out of Food in the Last Year: Never true  Transportation Needs: No Transportation Needs (11/19/2021)   PRAPARE - Administrator, Civil Service (Medical): No    Lack of Transportation (Non-Medical): No  Physical Activity: Insufficiently Active (11/19/2021)   Exercise Vital Sign    Days of Exercise per Week: 2 days    Minutes of Exercise  per Session: 30 min  Stress: No Stress Concern Present (11/19/2021)   [employer] of Occupational Health - Occupational Stress Questionnaire    Feeling of Stress : Not at all  Social Connections: Moderately Integrated (11/19/2021)   Social Connection and Isolation Panel [NHANES]    Frequency of Communication with Friends and Family: More than three times a week    Frequency of Social Gatherings with Friends and Family: More than three times a week    Attends Religious Services: More than 4 times per year    Active Member of [employer] or Organizations: Yes    Attends Banker Meetings: More than 4 times per year    Marital Status: Widowed  Intimate Partner Violence: Not At Risk (11/19/2021)   Humiliation, Afraid, Rape, and Kick questionnaire    Fear of Current or Ex-Partner: No    Emotionally Abused: No    Physically Abused: No    Sexually Abused: No    Review of Systems  Constitutional:  Negative for fatigue and unexpected weight change.  Respiratory:  Negative for chest tightness and shortness of breath.   Cardiovascular:  Negative for chest pain, palpitations and leg swelling.  Gastrointestinal:  Negative for abdominal pain and blood in stool.  Neurological:  Negative for dizziness, syncope, light-headedness and headaches (single fleeting HA last week.).     Objective:   Vitals:   09/07/22 0828  BP: 124/66  Pulse: (!) 55  Temp: 97.8 F (36.6 C)  TempSrc: Temporal  SpO2: 99%  Weight: 163 lb 12.8 oz (74.3 kg)  Height: 5\' 5"  (1.651 m)     Physical Exam Vitals reviewed.  Constitutional:      Appearance: Normal appearance. She is well-developed.  HENT:     Head: Normocephalic and atraumatic.  Eyes:     Conjunctiva/sclera: Conjunctivae normal.     Pupils: Pupils are equal, round, and reactive to light.  Neck:     Vascular: No carotid bruit.  Cardiovascular:     Rate and Rhythm: Normal rate and regular rhythm.     Heart sounds: Normal heart sounds.   Pulmonary:     Effort: Pulmonary effort is normal.     Breath sounds: Normal breath sounds.  Abdominal:     Palpations: Abdomen is soft. There is no pulsatile mass.     Tenderness: There is no abdominal tenderness.  Musculoskeletal:     Right lower leg: No edema.     Left lower leg: No edema.  Skin:    General: Skin is warm and dry.  Neurological:     Mental Status: She is alert and oriented to person, place, and time.  Psychiatric:        Mood and Affect: Mood normal.        Behavior: Behavior normal.        Assessment & Plan:  [name] is a 72 y.o. female . Prediabetes - Plan: Hemoglobin A1c, Comprehensive metabolic panel  -Weight stable, check A1c, adjustment of plan accordingly.  Continue diet/exercise approach. Followed by pain management for postlaminectomy syndrome, may impact exercise.  Hyperlipidemia, unspecified hyperlipidemia type - Plan: Comprehensive metabolic panel, Lipid panel, simvastatin (ZOCOR) 40 MG tablet  -Check updated labs, tolerating simvastatin, continue same dose with adjustment of plan with labs if needed.  Essential hypertension - Plan: hydrochlorothiazide (HYDRODIURIL) 25 MG tablet  -Stable, continue HCTZ same dose.  RTC precautions if recurrence of headaches or frequent headaches.  History of tobacco use - Plan: Ambulatory Referral Lung Cancer Screening [facility]  -Agrees to repeat referral for low-dose CT/lung cancer screening.  Meds ordered this encounter  Medications   hydrochlorothiazide (HYDRODIURIL) 25 MG tablet    Sig: TAKE 1 TABLET BY MOUTH DAILY    Dispense:  90 tablet    Refill:  1   simvastatin (ZOCOR) 40 MG tablet    Sig: TAKE 1 TABLET(40 MG) BY MOUTH DAILY    Dispense:  90 tablet    Refill:  1   There are no Patient Instructions on file for this visit.    Signed,   Meredith Staggers, MD [facility], [facility] 09/07/22 8:57 AM

## 2022-09-12 ENCOUNTER — Other Ambulatory Visit: Payer: Self-pay | Admitting: Family Medicine

## 2022-09-12 DIAGNOSIS — I10 Essential (primary) hypertension: Secondary | ICD-10-CM

## 2022-09-23 DIAGNOSIS — M5414 Radiculopathy, thoracic region: Secondary | ICD-10-CM | POA: Diagnosis not present

## 2022-10-21 DIAGNOSIS — M5414 Radiculopathy, thoracic region: Secondary | ICD-10-CM | POA: Diagnosis not present

## 2022-10-21 DIAGNOSIS — M546 Pain in thoracic spine: Secondary | ICD-10-CM | POA: Diagnosis not present

## 2022-10-21 DIAGNOSIS — M25512 Pain in left shoulder: Secondary | ICD-10-CM | POA: Diagnosis not present

## 2022-10-21 DIAGNOSIS — Z6826 Body mass index (BMI) 26.0-26.9, adult: Secondary | ICD-10-CM | POA: Diagnosis not present

## 2022-10-21 DIAGNOSIS — M542 Cervicalgia: Secondary | ICD-10-CM | POA: Diagnosis not present

## 2022-10-21 DIAGNOSIS — M5416 Radiculopathy, lumbar region: Secondary | ICD-10-CM | POA: Diagnosis not present

## 2022-10-30 ENCOUNTER — Other Ambulatory Visit: Payer: Self-pay | Admitting: Family Medicine

## 2022-10-30 DIAGNOSIS — Z1231 Encounter for screening mammogram for malignant neoplasm of breast: Secondary | ICD-10-CM

## 2022-11-09 ENCOUNTER — Telehealth: Payer: Self-pay | Admitting: Family Medicine

## 2022-11-09 DIAGNOSIS — M961 Postlaminectomy syndrome, not elsewhere classified: Secondary | ICD-10-CM

## 2022-11-09 DIAGNOSIS — G894 Chronic pain syndrome: Secondary | ICD-10-CM

## 2022-11-09 NOTE — Telephone Encounter (Signed)
Patient informed of the message below.  Message sent to PCP as the patient requested a referral to a different pain mgmt provider and is aware someone will contact her with appt info.

## 2022-11-09 NOTE — Telephone Encounter (Signed)
Caller name: VIENNA FOLDEN  On DPR?: Yes  Call back number: [phone] (mobile)  Provider they see: Shade Flood, MD  Reason for call:  Pt is calling because pain mgt Dr said that because daughter won't count pills they will no longer fill pain meds prescription. So she's asking for either pain meds or prescribe something else.

## 2022-11-09 NOTE — Telephone Encounter (Signed)
Sent to Dr.Greene for advise

## 2022-11-09 NOTE — Telephone Encounter (Signed)
Unfortunately I am unable to change a medication plan if under the care of pain management.  I recommend they discuss alternatives with that pain management provider or can refer to separate pain management provider if needed.

## 2022-11-10 NOTE — Telephone Encounter (Signed)
Has been followed by pain management for postlaminectomy syndrome and treated with gabapentin, hydrocodone previously.  On controlled substance database review gabapentin was last filled on September 24, hydrocodone July 29, previously in May.  She has been followed by [facility].  I will place a referral to pain management, should coordinate any specific refills with her current provider until that appointment.  If any other concerns or we need to discuss this further, it may be best with an office visit.  Thanks.

## 2022-11-10 NOTE — Telephone Encounter (Signed)
Patient was informed and thanked [place] for getting her to the right places to care for her health

## 2022-11-16 DIAGNOSIS — Z0000 Encounter for general adult medical examination without abnormal findings: Secondary | ICD-10-CM | POA: Diagnosis not present

## 2022-11-16 DIAGNOSIS — E785 Hyperlipidemia, unspecified: Secondary | ICD-10-CM | POA: Diagnosis not present

## 2022-11-16 DIAGNOSIS — Z87891 Personal history of nicotine dependence: Secondary | ICD-10-CM | POA: Diagnosis not present

## 2022-11-16 DIAGNOSIS — E663 Overweight: Secondary | ICD-10-CM | POA: Diagnosis not present

## 2022-11-16 DIAGNOSIS — Z6825 Body mass index (BMI) 25.0-25.9, adult: Secondary | ICD-10-CM | POA: Diagnosis not present

## 2022-11-16 DIAGNOSIS — R7303 Prediabetes: Secondary | ICD-10-CM | POA: Diagnosis not present

## 2022-11-16 DIAGNOSIS — I10 Essential (primary) hypertension: Secondary | ICD-10-CM | POA: Diagnosis not present

## 2022-11-27 ENCOUNTER — Ambulatory Visit
Admission: RE | Admit: 2022-11-27 | Discharge: 2022-11-27 | Disposition: A | Discharge status: Home / Self Care | Payer: Medicare HMO | Source: Ambulatory Visit | Attending: Family Medicine | Admitting: Family Medicine

## 2022-11-27 DIAGNOSIS — Z1231 Encounter for screening mammogram for malignant neoplasm of breast: Secondary | ICD-10-CM | POA: Diagnosis not present

## 2022-11-27 LAB — HM MAMMOGRAPHY

## 2022-12-03 ENCOUNTER — Ambulatory Visit: Payer: Medicare HMO | Admitting: *Deleted

## 2022-12-03 DIAGNOSIS — Z0000 Encounter for general adult medical examination without abnormal findings: Secondary | ICD-10-CM | POA: Diagnosis not present

## 2022-12-03 NOTE — Progress Notes (Signed)
Subjective:   [name] is a 72 y.o. female who presents for Medicare Annual (Subsequent) preventive examination.  Visit Complete: Virtual I connected with  Geraldo Docker on 12/03/22 by a audio enabled telemedicine application and verified that I am speaking with the correct person using two identifiers.  Patient Location: Home  Provider Location: Home Office  I discussed the limitations of evaluation and management by telemedicine. The patient expressed understanding and agreed to proceed.  Vital Signs: Because this visit was a virtual/telehealth visit, some criteria may be missing or patient reported. Any vitals not documented were not able to be obtained and vitals that have been documented are patient reported  Cardiac Risk Factors include: advanced age (>60men, >19 women);hypertension     Objective:    Today's Vitals   12/03/22 0839  PainSc: 6    There is no height or weight on file to calculate BMI.     12/03/2022    8:41 AM 04/15/2022    8:18 AM 11/19/2021    8:25 AM 07/01/2020   11:17 AM 06/20/2019    1:08 PM 05/16/2018    9:04 AM 02/01/2018    7:10 AM  Advanced Directives  Does Patient Have a Medical Advance Directive? No No No No No No No  Does patient want to make changes to medical advance directive?     No - Patient declined    Would patient like information on creating a medical advance directive? No - Patient declined  No - Patient declined No - Patient declined No - Patient declined No - Patient declined     Current Medications (verified) Outpatient Encounter Medications as of 12/03/2022  Medication Sig   aspirin 81 MG tablet Take 81 mg by mouth daily.   gabapentin (NEURONTIN) 100 MG capsule Take 100 mg by mouth daily.   hydrochlorothiazide (HYDRODIURIL) 25 MG tablet TAKE 1 TABLET BY MOUTH DAILY   HYDROcodone-acetaminophen (NORCO) 10-325 MG tablet Take 1 tablet by mouth every 8 (eight) hours as needed (pain).    methocarbamol (ROBAXIN) 500 MG tablet  Take 0.5-1 tablets (250-500 mg total) by mouth every 8 (eight) hours as needed for muscle spasms.   Misc. Devices [facility]) MISC 1 Device by Does not apply route as needed. Needed for low back pain, history of lumbar surgery, rollator insufficient for walking due to bursitis and back pain.   Multiple Vitamins-Minerals (MULTIVITAMIN WOMEN 50+ PO) Take 1 tablet by mouth daily.   simvastatin (ZOCOR) 40 MG tablet TAKE 1 TABLET(40 MG) BY MOUTH DAILY   ZTLIDO 1.8 % PTCH Apply 1 patch topically daily.   No facility-administered encounter medications on file as of 12/03/2022.    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   Chronic bronchitis (HCC)    "when I get a cold I normally get bronchitis" (01/30/2013)   Hepatitis C    treated and states negative now on 08/04/21   Hyperlipidemia    Hypertension    Past Surgical History:  Procedure Laterality Date   ABDOMINAL HYSTERECTOMY  10/10/1988   BACK SURGERY     x5   CHOLECYSTECTOMY  10/10/1988   COLONOSCOPY  06/2015   JOINT REPLACEMENT Left 01/24/2013   hip   LUMBAR SPINE SURGERY  2010; 2011   "tightened up some screws" (01/30/2013)   POSTERIOR LUMBAR FUSION  2006 X 2; 2008   TOTAL HIP ARTHROPLASTY Left 01/24/2013   Procedure:  LEFT TOTAL HIP ARTHROPLASTY ANTERIOR APPROACH;  Surgeon: Kathryne Hitch, MD;  Location: [facility] OR;  Service: Orthopedics;  Laterality: Left;   TUBAL LIGATION  10/10/1968   Family History  Problem Relation Age of Onset   Esophageal cancer Mother    Breast cancer Sister    Prostate cancer Brother    Colon cancer Neg Hx    Rectal cancer Neg Hx    Stomach cancer Neg Hx    Colon polyps Neg Hx    Social History   Socioeconomic History   Marital status: Widowed    Spouse name: Not on file   Number of children: Not on file   Years of education: Not on file   Highest education level: Not on file  Occupational History    Occupation: retired  Tobacco Use   Smoking status: Former    Current packs/day: 0.00    Average packs/day: 1 pack/day for 45.0 years (45.0 ttl pk-yrs)    Types: Cigarettes    Start date: 05/25/1970    Quit date: 05/25/2015    Years since quitting: 7.5   Smokeless tobacco: Never  Vaping Use   Vaping status: Never Used  Substance and Sexual Activity   Alcohol use: No    Alcohol/week: 0.0 standard drinks of alcohol    Comment: 01/30/2013 "used to drink beer; none since ~ 1989"   Drug use: Never   Sexual activity: Yes    Birth control/protection: Post-menopausal  Other Topics Concern   Not on file  Social History Narrative   Not on file   Social Determinants of Health   Financial Resource Strain: Low Risk  (12/03/2022)   Overall Financial Resource Strain (CARDIA)    Difficulty of Paying Living Expenses: Not hard at all  Food Insecurity: No Food Insecurity (12/03/2022)   Hunger Vital Sign    Worried About Running Out of Food in the Last Year: Never true    Ran Out of Food in the Last Year: Never true  Transportation Needs: No Transportation Needs (12/03/2022)   PRAPARE - Administrator, Civil Service (Medical): No    Lack of Transportation (Non-Medical): No  Physical Activity: Inactive (12/03/2022)   Exercise Vital Sign    Days of Exercise per Week: 0 days    Minutes of Exercise per Session: 0 min  Stress: No Stress Concern Present (12/03/2022)   [employer] of Occupational Health - Occupational Stress Questionnaire    Feeling of Stress : Not at all  Social Connections: Moderately Integrated (12/03/2022)   Social Connection and Isolation Panel [NHANES]    Frequency of Communication with Friends and Family: More than three times a week    Frequency of Social Gatherings with Friends and Family: More than three times a week    Attends Religious Services: More than 4 times per year    Active Member of [employer] or Organizations: Yes    Attends Tax inspector Meetings: More than 4 times per year    Marital Status: Widowed    Tobacco Counseling Counseling given: Not Answered   Clinical Intake:  Pre-visit preparation completed: Yes  Pain : 0-10 Pain Score: 6  Pain Location: Back Pain Descriptors / Indicators: Constant, Burning, Aching, Dull Pain Relieving Factors: hydorocodone  Pain Relieving Factors: hydorocodone  Diabetes: No  How often do you need to have someone help you when you read instructions, pamphlets, or other written materials from your doctor or pharmacy?: 1 - Never  Interpreter Needed?: No  Information entered by :: Remi Haggard LPN   Activities of Daily Living    12/03/2022    8:45 AM  In your present state of health, do you have any difficulty performing the following activities:  Hearing? 0  Vision? 0  Difficulty concentrating or making decisions? 0  Walking or climbing stairs? 1  Dressing or bathing? 0  Doing errands, shopping? 0  Preparing Food and eating ? N  Using the Toilet? N  In the past six months, have you accidently leaked urine? N  Do you have problems with loss of bowel control? N  Managing your Medications? N  Managing your Finances? N  Housekeeping or managing your Housekeeping? N    Patient Care Team: Shade Flood, MD as PCP - General (Family Medicine) Glenford Peers, OD as Referring Physician (Optometry) Basilia Jumbo Kae Heller, PA-C as Physician Assistant (Neurosurgery)  Indicate any recent Medical Services you may have received from other than [facility] providers in the past year (date may be approximate).     Assessment:   This is a routine wellness examination for Sophiana.  Hearing/Vision screen Hearing Screening - Comments:: No trouble hearing Vision Screening - Comments:: Up to date [facility]   Goals Addressed             This Visit's Progress    Patient Stated       Would like to walk better with better balance       Depression  Screen    12/03/2022    8:43 AM 09/07/2022    8:26 AM 03/09/2022    8:38 AM 12/17/2021    2:08 PM 11/19/2021    8:23 AM 09/01/2021    9:27 AM 05/26/2021    1:48 PM  PHQ 2/9 Scores  PHQ - 2 Score 0 0 0 0 0 0 0  PHQ- 9 Score 0 0 0 0       Fall Risk    12/03/2022    8:39 AM 09/07/2022    8:25 AM 03/09/2022    8:38 AM 12/17/2021    2:09 PM 11/19/2021    8:21 AM  Fall Risk   Falls in the past year? 0 0 0 0 0  Number falls in past yr: 0 0 0 0 0  Injury with Fall? 0 0 0 0 0  Risk for fall due to :  No Fall Risks No Fall Risks No Fall Risks No Fall Risks  Follow up Falls evaluation completed;Education provided;Falls prevention discussed Falls evaluation completed  Falls evaluation completed Falls prevention discussed    MEDICARE RISK AT HOME: Medicare Risk at Home Any stairs in or around the home?: No If so, are there any without handrails?: No Home free of loose throw rugs in walkways, pet beds, electrical cords, etc?: Yes Adequate lighting in your home to reduce risk of falls?: Yes Life alert?: No Use of a cane, walker or w/c?: Yes Grab bars in the bathroom?: Yes Shower chair or bench in shower?: No Elevated toilet seat or a handicapped toilet?: Yes  TIMED UP AND GO:  Was the test performed?  No    Cognitive Function:        12/03/2022    8:45 AM 11/19/2021    8:26 AM 09/01/2021    9:26 AM 06/20/2019    1:08 PM 05/16/2018    9:08 AM  6CIT Screen  What Year? 0 points 0 points 0 points 0 points 0 points  What month? 0 points 0 points 0  points 0 points 0 points  What time? 0 points 0 points 0 points 0 points 0 points  Count back from 20 0 points 0 points 0 points 0 points 0 points  Months in reverse 4 points 0 points 0 points 2 points 0 points  Repeat phrase 0 points 0 points 0 points 2 points 0 points  Total Score 4 points 0 points 0 points 4 points 0 points    Immunizations Immunization History  Administered Date(s) Administered   Fluad Quad(high Dose 65+) 12/17/2021    Influenza Split 11/10/2011, 10/25/2014   Influenza,inj,Quad PF,6+ Mos 12/06/2012, 11/10/2014, 10/15/2016   Influenza-Unspecified 10/10/2013, 11/05/2015, 10/19/2017, 10/07/2018   Moderna Sars-Covid-2 Vaccination 03/25/2019, 04/22/2019   PFIZER(Purple Top)SARS-COV-2 Vaccination 12/05/2019   Pneumococcal Conjugate-13 01/18/2015, 10/19/2017   Pneumococcal Polysaccharide-23 10/24/2018   Pneumococcal-Unspecified 12/15/2004   Tdap 09/02/2006, 11/05/2015   Zoster Recombinant(Shingrix) 08/06/2016, 10/17/2016   Zoster, Live 11/25/2012    TDAP status: Up to date  Flu Vaccine status: Due, Education has been provided regarding the importance of this vaccine. Advised may receive this vaccine at local pharmacy or Health Dept. Aware to provide a copy of the vaccination record if obtained from local pharmacy or Health Dept. Verbalized acceptance and understanding.  Pneumococcal vaccine status: Up to date  Covid-19 vaccine status: Information provided on how to obtain vaccines.   Qualifies for Shingles Vaccine? No   Zostavax completed Yes   Shingrix Completed?: Yes  Screening Tests Health Maintenance  Topic Date Due   INFLUENZA VACCINE  05/10/2023 (Originally 09/10/2022)   Lung Cancer Screening  12/03/2023 (Originally 10/27/2014)   Medicare Annual Wellness (AWV)  12/03/2023   MAMMOGRAM  11/26/2024   DTaP/Tdap/Td (3 - Td or Tdap) 11/04/2025   Colonoscopy  08/27/2026   Pneumonia Vaccine 36+ Years old  Completed   DEXA SCAN  Completed   Hepatitis C Screening  Completed   Zoster Vaccines- Shingrix  Completed   HPV VACCINES  Aged Out   COVID-19 Vaccine  Discontinued    Health Maintenance  There are no preventive care reminders to display for this patient.   Colorectal cancer screening: Type of screening: Colonoscopy. Completed 2023. Repeat every 5 years  Mammogram status: Completed  . Repeat every year  Bone Density status: Completed 2024. Results reflect: Bone density results:  OSTEOPENIA. Repeat every 2 years.  Lung Cancer Screening: (Low Dose CT Chest recommended if Age 64-80 years, 20 pack-year currently smoking OR have quit w/in 15years.) does qualify.   Lung Cancer Screening Referral: declined  Additional Screening:  Hepatitis C Screening: does not qualify; Completed 2018  Vision Screening: Recommended annual ophthalmology exams for early detection of glaucoma and other disorders of the eye. Is the patient up to date with their annual eye exam?  Yes  Who is the provider or what is the name of the office in which the patient attends annual eye exams? [facility] If pt is not established with a provider, would they like to be referred to a provider to establish care? No .   Dental Screening: Recommended annual dental exams for proper oral hygiene   Community Resource Referral / Chronic Care Management: CRR required this visit?  No   CCM required this visit?  No     Plan:     I have personally reviewed and noted the following in the patient's chart:   Medical and social history Use of alcohol, tobacco or illicit drugs  Current medications and supplements including opioid prescriptions. Patient is currently taking opioid prescriptions.  Information provided to patient regarding non-opioid alternatives. Patient advised to discuss non-opioid treatment plan with their provider. Functional ability and status Nutritional status Physical activity Advanced directives List of other physicians Hospitalizations, surgeries, and ER visits in previous 12 months Vitals Screenings to include cognitive, depression, and falls Referrals and appointments  In addition, I have reviewed and discussed with patient certain preventive protocols, quality metrics, and best practice recommendations. A written personalized care plan for preventive services as well as general preventive health recommendations were provided to patient.     Remi Haggard, LPN   [id number]/11/[id number]    After Visit Summary: (MyChart) Due to this being a telephonic visit, the after visit summary with patients personalized plan was offered to patient via MyChart   Nurse Notes:

## 2022-12-03 NOTE — Patient Instructions (Signed)
[name] , Thank you for taking time to come for your Medicare Wellness Visit. I appreciate your ongoing commitment to your health goals. Please review the following plan we discussed and let me know if I can assist you in the future.   Screening recommendations/referrals: Colonoscopy: up to date Mammogram: up to date Bone Density: up to date Recommended yearly ophthalmology/optometry visit for glaucoma screening and checkup Recommended yearly dental visit for hygiene and checkup  Vaccinations: Influenza vaccine: Education provided Pneumococcal vaccine: up to date Tdap vaccine: up to date Shingles vaccine: up to date    Advanced directives: Education provided   Preventive Care 65 Years and Older, Female Preventive care refers to lifestyle choices and visits with your health care provider that can promote health and wellness. What does preventive care include? A yearly physical exam. This is also called an annual well check. Dental exams once or twice a year. Routine eye exams. Ask your health care provider how often you should have your eyes checked. Personal lifestyle choices, including: Daily care of your teeth and gums. Regular physical activity. Eating a healthy diet. Avoiding tobacco and drug use. Limiting alcohol use. Practicing safe sex. Taking low-dose aspirin every day. Taking vitamin and mineral supplements as recommended by your health care provider. What happens during an annual well check? The services and screenings done by your health care provider during your annual well check will depend on your age, overall health, lifestyle risk factors, and family history of disease. Counseling  Your health care provider may ask you questions about your: Alcohol use. Tobacco use. Drug use. Emotional well-being. Home and relationship well-being. Sexual activity. Eating habits. History of falls. Memory and ability to understand (cognition). Work and work  Astronomer. Reproductive health. Screening  You may have the following tests or measurements: Height, weight, and BMI. Blood pressure. Lipid and cholesterol levels. These may be checked every 5 years, or more frequently if you are over 61 years old. Skin check. Lung cancer screening. You may have this screening every year starting at age 90 if you have a 30-pack-year history of smoking and currently smoke or have quit within the past 15 years. Fecal occult blood test (FOBT) of the stool. You may have this test every year starting at age 27. Flexible sigmoidoscopy or colonoscopy. You may have a sigmoidoscopy every 5 years or a colonoscopy every 10 years starting at age 16. Hepatitis C blood test. Hepatitis B blood test. Sexually transmitted disease (STD) testing. Diabetes screening. This is done by checking your blood sugar (glucose) after you have not eaten for a while (fasting). You may have this done every 1-3 years. Bone density scan. This is done to screen for osteoporosis. You may have this done starting at age 65. Mammogram. This may be done every 1-2 years. Talk to your health care provider about how often you should have regular mammograms. Talk with your health care provider about your test results, treatment options, and if necessary, the need for more tests. Vaccines  Your health care provider may recommend certain vaccines, such as: Influenza vaccine. This is recommended every year. Tetanus, diphtheria, and acellular pertussis (Tdap, Td) vaccine. You may need a Td booster every 10 years. Zoster vaccine. You may need this after age 65. Pneumococcal 13-valent conjugate (PCV13) vaccine. One dose is recommended after age 74. Pneumococcal polysaccharide (PPSV23) vaccine. One dose is recommended after age 10. Talk to your health care provider about which screenings and vaccines you need and how often you  need them. This information is not intended to replace advice given to you by  your health care provider. Make sure you discuss any questions you have with your health care provider. Document Released: 02/22/2015 Document Revised: 10/16/2015 Document Reviewed: 11/27/2014 Elsevier Interactive Patient Education  2017 [employer].  Fall Prevention in the Home Falls can cause injuries. They can happen to people of all ages. There are many things you can do to make your home safe and to help prevent falls. What can I do on the outside of my home? Regularly fix the edges of walkways and driveways and fix any cracks. Remove anything that might make you trip as you walk through a door, such as a raised step or threshold. Trim any bushes or trees on the path to your home. Use bright outdoor lighting. Clear any walking paths of anything that might make someone trip, such as rocks or tools. Regularly check to see if handrails are loose or broken. Make sure that both sides of any steps have handrails. Any raised decks and porches should have guardrails on the edges. Have any leaves, snow, or ice cleared regularly. Use sand or salt on walking paths during winter. Clean up any spills in your garage right away. This includes oil or grease spills. What can I do in the bathroom? Use night lights. Install grab bars by the toilet and in the tub and shower. Do not use towel bars as grab bars. Use non-skid mats or decals in the tub or shower. If you need to sit down in the shower, use a plastic, non-slip stool. Keep the floor dry. Clean up any water that spills on the floor as soon as it happens. Remove soap buildup in the tub or shower regularly. Attach bath mats securely with double-sided non-slip rug tape. Do not have throw rugs and other things on the floor that can make you trip. What can I do in the bedroom? Use night lights. Make sure that you have a light by your bed that is easy to reach. Do not use any sheets or blankets that are too big for your bed. They should not hang  down onto the floor. Have a firm chair that has side arms. You can use this for support while you get dressed. Do not have throw rugs and other things on the floor that can make you trip. What can I do in the kitchen? Clean up any spills right away. Avoid walking on wet floors. Keep items that you use a lot in easy-to-reach places. If you need to reach something above you, use a strong step stool that has a grab bar. Keep electrical cords out of the way. Do not use floor polish or wax that makes floors slippery. If you must use wax, use non-skid floor wax. Do not have throw rugs and other things on the floor that can make you trip. What can I do with my stairs? Do not leave any items on the stairs. Make sure that there are handrails on both sides of the stairs and use them. Fix handrails that are broken or loose. Make sure that handrails are as long as the stairways. Check any carpeting to make sure that it is firmly attached to the stairs. Fix any carpet that is loose or worn. Avoid having throw rugs at the top or bottom of the stairs. If you do have throw rugs, attach them to the floor with carpet tape. Make sure that you have a light switch  at the top of the stairs and the bottom of the stairs. If you do not have them, ask someone to add them for you. What else can I do to help prevent falls? Wear shoes that: Do not have high heels. Have rubber bottoms. Are comfortable and fit you well. Are closed at the toe. Do not wear sandals. If you use a stepladder: Make sure that it is fully opened. Do not climb a closed stepladder. Make sure that both sides of the stepladder are locked into place. Ask someone to hold it for you, if possible. Clearly mark and make sure that you can see: Any grab bars or handrails. First and last steps. Where the edge of each step is. Use tools that help you move around (mobility aids) if they are needed. These  include: Canes. Walkers. Scooters. Crutches. Turn on the lights when you go into a dark area. Replace any light bulbs as soon as they burn out. Set up your furniture so you have a clear path. Avoid moving your furniture around. If any of your floors are uneven, fix them. If there are any pets around you, be aware of where they are. Review your medicines with your doctor. Some medicines can make you feel dizzy. This can increase your chance of falling. Ask your doctor what other things that you can do to help prevent falls. This information is not intended to replace advice given to you by your health care provider. Make sure you discuss any questions you have with your health care provider. Document Released: 11/22/2008 Document Revised: 07/04/2015 Document Reviewed: 03/02/2014 Elsevier Interactive Patient Education  2017 [employer].

## 2022-12-28 DIAGNOSIS — M5414 Radiculopathy, thoracic region: Secondary | ICD-10-CM | POA: Diagnosis not present

## 2022-12-30 DIAGNOSIS — Z79899 Other long term (current) drug therapy: Secondary | ICD-10-CM | POA: Diagnosis not present

## 2022-12-30 DIAGNOSIS — E559 Vitamin D deficiency, unspecified: Secondary | ICD-10-CM | POA: Diagnosis not present

## 2022-12-30 DIAGNOSIS — M129 Arthropathy, unspecified: Secondary | ICD-10-CM | POA: Diagnosis not present

## 2022-12-30 DIAGNOSIS — M5416 Radiculopathy, lumbar region: Secondary | ICD-10-CM | POA: Diagnosis not present

## 2022-12-30 DIAGNOSIS — G8929 Other chronic pain: Secondary | ICD-10-CM | POA: Diagnosis not present

## 2022-12-30 DIAGNOSIS — Z6826 Body mass index (BMI) 26.0-26.9, adult: Secondary | ICD-10-CM | POA: Diagnosis not present

## 2023-01-01 DIAGNOSIS — Z79899 Other long term (current) drug therapy: Secondary | ICD-10-CM | POA: Diagnosis not present

## 2023-01-04 ENCOUNTER — Ambulatory Visit: Payer: Medicare HMO | Admitting: Family Medicine

## 2023-01-04 VITALS — BP 138/72 | HR 78 | Temp 98.4°F | Ht 65.0 in | Wt 161.8 lb

## 2023-01-04 DIAGNOSIS — G894 Chronic pain syndrome: Secondary | ICD-10-CM

## 2023-01-04 NOTE — Progress Notes (Signed)
Subjective:  Patient ID: [name], female    DOB: 1950/09/02  Age: 72 y.o. MRN: [id number]  CC:  Chief Complaint  Patient presents with   Pain Management    Pt went to [facility] on [address] for pain meds due to Dr Day dropping her as a patient so pt is establishing with [facility]     HPI [name] presents for   Follow-up.  Last visit in July.  Plan on 77-month follow-up. She has been under the care of Dr. Morrie Sheldon for pain management with [facility].  See telephone note from September 30.  Has been under the care of pain management for postlaminectomy syndrome and treated with gabapentin, hydrocodone previously.  Was unable to be followed by that provider anymore and has now established with [facility] for pain management. Saw them last week. Has been started back on meds. Back injections through neurosurgery on the 18th - feeling better. Has follow up with them in a few days.    History Patient Active Problem List   Diagnosis Date Noted   Pre-diabetes 01/30/2020   Trochanteric bursitis, left hip 07/02/2016   Essential hypertension 03/16/2016   Hyperlipidemia 03/16/2016   SBO (small bowel obstruction) (HCC) 01/30/2013   Degenerative arthritis of left hip 01/24/2013   Status post THR (total hip replacement) 01/24/2013   Past Medical History:  Diagnosis Date   Arthritis 08/04/2021   left shoulder   Chronic bronchitis (HCC)    "when I get a cold I normally get bronchitis" (01/30/2013)   Hepatitis C    treated and states negative now on 08/04/21   Hyperlipidemia    Hypertension    Past Surgical History:  Procedure Laterality Date   ABDOMINAL HYSTERECTOMY  10/10/1988   BACK SURGERY     x5   CHOLECYSTECTOMY  10/10/1988   COLONOSCOPY  06/2015   JOINT REPLACEMENT Left 01/24/2013   hip   LUMBAR SPINE SURGERY  2010; 2011   "tightened up some screws" (01/30/2013)   POSTERIOR LUMBAR FUSION  2006 X 2; 2008   TOTAL HIP ARTHROPLASTY  Left 01/24/2013   Procedure: LEFT TOTAL HIP ARTHROPLASTY ANTERIOR APPROACH;  Surgeon: Kathryne Hitch, MD;  Location: [facility];  Service: Orthopedics;  Laterality: Left;   TUBAL LIGATION  10/10/1968   Allergies  Allergen Reactions   No Known Allergies    Prior to Admission medications   Medication Sig Start Date End Date Taking? Authorizing Provider  aspirin 81 MG tablet Take 81 mg by mouth daily.    [provider]  gabapentin (NEURONTIN) 100 MG capsule Take 100 mg by mouth daily. 03/05/20   [provider]  hydrochlorothiazide (HYDRODIURIL) 25 MG tablet TAKE 1 TABLET BY MOUTH DAILY 09/07/22   Shade Flood, MD  HYDROcodone-acetaminophen [facility]) 10-325 MG tablet Take 1 tablet by mouth every 8 (eight) hours as needed (pain).     [provider]  methocarbamol (ROBAXIN) 500 MG tablet Take 0.5-1 tablets (250-500 mg total) by mouth every 8 (eight) hours as needed for muscle spasms. 04/15/22   Benjiman Core, MD  Misc. Devices [facility]) MISC 1 Device by Does not apply route as needed. Needed for low back pain, history of lumbar surgery, rollator insufficient for walking due to bursitis and back pain. 08/27/16   Shade Flood, MD  Multiple Vitamins-Minerals (MULTIVITAMIN WOMEN 50+ PO) Take 1 tablet by mouth daily.    [provider]  simvastatin (ZOCOR) 40 MG tablet TAKE  1 TABLET(40 MG) BY MOUTH DAILY 09/07/22   Shade Flood, MD  ZTLIDO 1.8 % [facility] Apply 1 patch topically daily. 07/14/21   [provider]   Social History   Socioeconomic History   Marital status: Widowed    Spouse name: Not on file   Number of children: Not on file   Years of education: Not on file   Highest education level: GED or equivalent  Occupational History   Occupation: retired  Tobacco Use   Smoking status: Former    Current packs/day: 0.00    Average packs/day: 1 pack/day for 45.0 years (45.0 ttl pk-yrs)    Types: Cigarettes    Start date: 05/25/1970     Quit date: 05/25/2015    Years since quitting: 7.6   Smokeless tobacco: Never  Vaping Use   Vaping status: Never Used  Substance and Sexual Activity   Alcohol use: No    Alcohol/week: 0.0 standard drinks of alcohol    Comment: 01/30/2013 "used to drink beer; none since ~ 1989"   Drug use: Never   Sexual activity: Yes    Birth control/protection: Post-menopausal  Other Topics Concern   Not on file  Social History Narrative   Not on file   Social Determinants of Health   Financial Resource Strain: Medium Risk (01/04/2023)   Overall Financial Resource Strain (CARDIA)    Difficulty of Paying Living Expenses: Somewhat hard  Food Insecurity: Food Insecurity Present (01/04/2023)   Hunger Vital Sign    Worried About Running Out of Food in the Last Year: Sometimes true    Ran Out of Food in the Last Year: Sometimes true  Transportation Needs: No Transportation Needs (01/04/2023)   PRAPARE - Administrator, Civil Service (Medical): No    Lack of Transportation (Non-Medical): No  Physical Activity: Inactive (01/04/2023)   Exercise Vital Sign    Days of Exercise per Week: 5 days    Minutes of Exercise per Session: 0 min  Stress: No Stress Concern Present (01/04/2023)   [employer] of Occupational Health - Occupational Stress Questionnaire    Feeling of Stress : Not at all  Social Connections: Moderately Integrated (01/04/2023)   Social Connection and Isolation Panel [NHANES]    Frequency of Communication with Friends and Family: More than three times a week    Frequency of Social Gatherings with Friends and Family: Once a week    Attends Religious Services: More than 4 times per year    Active Member of [employer] or Organizations: Yes    Attends Banker Meetings: More than 4 times per year    Marital Status: Widowed  Intimate Partner Violence: Not At Risk (12/03/2022)   Humiliation, Afraid, Rape, and Kick questionnaire    Fear of Current or Ex-Partner: No     Emotionally Abused: No    Physically Abused: No    Sexually Abused: No    Review of Systems   Objective:   Vitals:   01/04/23 1622  BP: 138/72  Pulse: 78  Temp: 98.4 F (36.9 C)  TempSrc: Temporal  SpO2: 99%  Weight: 161 lb 12.8 oz (73.4 kg)  Height: 5\' 5"  (1.651 m)    Physical Exam Vitals reviewed.  Constitutional:      General: She is not in acute distress.    Appearance: Normal appearance. She is well-developed.  HENT:     Head: Normocephalic and atraumatic.  Cardiovascular:     Rate and Rhythm: Normal rate.  Pulmonary:     Effort: Pulmonary effort is normal.  Neurological:     Mental Status: She is alert and oriented to person, place, and time.  Psychiatric:        Mood and Affect: Mood normal.        Assessment & Plan:  HEAVEN ENRIQUES is a 72 y.o. female . Chronic pain syndrome Continues with care at neurosurgery and now under new pain management provider, with follow-up visit planned.  Symptoms have improved after recent injection.  Denies any needs for me at this time.  Follow-up in the next few months for her chronic medication review and labs.  No orders of the defined types were placed in this encounter.  Patient Instructions  Glad you are established with pain management.  They should be able to provide medication for you moving forward but please let me know if there are any questions or concerns.  Follow-up with me in January as planned, but happy to see you sooner if needed.  Take care.     Signed,   Meredith Staggers, MD [facility], [facility] 01/04/23 7:05 PM

## 2023-01-04 NOTE — Patient Instructions (Signed)
Glad you are established with pain management.  They should be able to provide medication for you moving forward but please let me know if there are any questions or concerns.  Follow-up with me in January as planned, but happy to see you sooner if needed.  Take care.

## 2023-01-06 DIAGNOSIS — M5416 Radiculopathy, lumbar region: Secondary | ICD-10-CM | POA: Diagnosis not present

## 2023-01-06 DIAGNOSIS — F1120 Opioid dependence, uncomplicated: Secondary | ICD-10-CM | POA: Diagnosis not present

## 2023-01-06 DIAGNOSIS — G8929 Other chronic pain: Secondary | ICD-10-CM | POA: Diagnosis not present

## 2023-01-06 DIAGNOSIS — E559 Vitamin D deficiency, unspecified: Secondary | ICD-10-CM | POA: Diagnosis not present

## 2023-01-06 DIAGNOSIS — M51369 Other intervertebral disc degeneration, lumbar region without mention of lumbar back pain or lower extremity pain: Secondary | ICD-10-CM | POA: Diagnosis not present

## 2023-01-06 DIAGNOSIS — M5134 Other intervertebral disc degeneration, thoracic region: Secondary | ICD-10-CM | POA: Diagnosis not present

## 2023-01-06 DIAGNOSIS — Z79899 Other long term (current) drug therapy: Secondary | ICD-10-CM | POA: Diagnosis not present

## 2023-01-06 DIAGNOSIS — M549 Dorsalgia, unspecified: Secondary | ICD-10-CM | POA: Diagnosis not present

## 2023-01-06 DIAGNOSIS — Z114 Encounter for screening for human immunodeficiency virus [HIV]: Secondary | ICD-10-CM | POA: Diagnosis not present

## 2023-01-06 DIAGNOSIS — M129 Arthropathy, unspecified: Secondary | ICD-10-CM | POA: Diagnosis not present

## 2023-01-06 DIAGNOSIS — Z1159 Encounter for screening for other viral diseases: Secondary | ICD-10-CM | POA: Diagnosis not present

## 2023-01-06 DIAGNOSIS — Z6826 Body mass index (BMI) 26.0-26.9, adult: Secondary | ICD-10-CM | POA: Diagnosis not present

## 2023-01-23 ENCOUNTER — Other Ambulatory Visit: Payer: Self-pay | Admitting: Family Medicine

## 2023-01-23 DIAGNOSIS — E785 Hyperlipidemia, unspecified: Secondary | ICD-10-CM

## 2023-01-29 ENCOUNTER — Encounter: Payer: Self-pay | Admitting: Emergency Medicine

## 2023-02-05 DIAGNOSIS — M5134 Other intervertebral disc degeneration, thoracic region: Secondary | ICD-10-CM | POA: Diagnosis not present

## 2023-02-05 DIAGNOSIS — F1120 Opioid dependence, uncomplicated: Secondary | ICD-10-CM | POA: Diagnosis not present

## 2023-02-05 DIAGNOSIS — M51369 Other intervertebral disc degeneration, lumbar region without mention of lumbar back pain or lower extremity pain: Secondary | ICD-10-CM | POA: Diagnosis not present

## 2023-02-05 DIAGNOSIS — Z6826 Body mass index (BMI) 26.0-26.9, adult: Secondary | ICD-10-CM | POA: Diagnosis not present

## 2023-02-05 DIAGNOSIS — Z79899 Other long term (current) drug therapy: Secondary | ICD-10-CM | POA: Diagnosis not present

## 2023-02-05 DIAGNOSIS — M5416 Radiculopathy, lumbar region: Secondary | ICD-10-CM | POA: Diagnosis not present

## 2023-02-05 DIAGNOSIS — G8929 Other chronic pain: Secondary | ICD-10-CM | POA: Diagnosis not present

## 2023-02-05 DIAGNOSIS — E559 Vitamin D deficiency, unspecified: Secondary | ICD-10-CM | POA: Diagnosis not present

## 2023-02-09 DIAGNOSIS — Z79899 Other long term (current) drug therapy: Secondary | ICD-10-CM | POA: Diagnosis not present

## 2023-03-01 ENCOUNTER — Telehealth: Payer: Self-pay

## 2023-03-01 NOTE — Telephone Encounter (Signed)
Received a fax informing Dr. Neva Seat and staff that patient was denied due to out of network. Placed form in provider box.

## 2023-03-02 NOTE — Telephone Encounter (Signed)
Noted.  Will forward to referrals to see if they can arrange for evaluation elsewhere.

## 2023-03-08 DIAGNOSIS — M5416 Radiculopathy, lumbar region: Secondary | ICD-10-CM | POA: Diagnosis not present

## 2023-03-08 DIAGNOSIS — E559 Vitamin D deficiency, unspecified: Secondary | ICD-10-CM | POA: Diagnosis not present

## 2023-03-08 DIAGNOSIS — Z6826 Body mass index (BMI) 26.0-26.9, adult: Secondary | ICD-10-CM | POA: Diagnosis not present

## 2023-03-08 DIAGNOSIS — G8929 Other chronic pain: Secondary | ICD-10-CM | POA: Diagnosis not present

## 2023-03-08 DIAGNOSIS — M5134 Other intervertebral disc degeneration, thoracic region: Secondary | ICD-10-CM | POA: Diagnosis not present

## 2023-03-08 DIAGNOSIS — M51369 Other intervertebral disc degeneration, lumbar region without mention of lumbar back pain or lower extremity pain: Secondary | ICD-10-CM | POA: Diagnosis not present

## 2023-03-08 DIAGNOSIS — Z79899 Other long term (current) drug therapy: Secondary | ICD-10-CM | POA: Diagnosis not present

## 2023-03-08 DIAGNOSIS — F1120 Opioid dependence, uncomplicated: Secondary | ICD-10-CM | POA: Diagnosis not present

## 2023-03-08 DIAGNOSIS — Z0000 Encounter for general adult medical examination without abnormal findings: Secondary | ICD-10-CM | POA: Diagnosis not present

## 2023-03-12 ENCOUNTER — Encounter: Payer: Self-pay | Admitting: Family Medicine

## 2023-03-12 ENCOUNTER — Ambulatory Visit: Payer: Medicare HMO | Admitting: Family Medicine

## 2023-03-12 VITALS — BP 130/78 | HR 60 | Temp 98.3°F | Ht 65.0 in | Wt 162.0 lb

## 2023-03-12 DIAGNOSIS — E785 Hyperlipidemia, unspecified: Secondary | ICD-10-CM | POA: Diagnosis not present

## 2023-03-12 DIAGNOSIS — R7303 Prediabetes: Secondary | ICD-10-CM | POA: Diagnosis not present

## 2023-03-12 DIAGNOSIS — Z23 Encounter for immunization: Secondary | ICD-10-CM | POA: Diagnosis not present

## 2023-03-12 DIAGNOSIS — M5414 Radiculopathy, thoracic region: Secondary | ICD-10-CM | POA: Insufficient documentation

## 2023-03-12 DIAGNOSIS — I10 Essential (primary) hypertension: Secondary | ICD-10-CM | POA: Diagnosis not present

## 2023-03-12 LAB — COMPREHENSIVE METABOLIC PANEL
ALT: 25 U/L (ref 0–35)
AST: 32 U/L (ref 0–37)
Albumin: 4.3 g/dL (ref 3.5–5.2)
Alkaline Phosphatase: 73 U/L (ref 39–117)
BUN: 16 mg/dL (ref 6–23)
CO2: 31 meq/L (ref 19–32)
Calcium: 9.9 mg/dL (ref 8.4–10.5)
Chloride: 104 meq/L (ref 96–112)
Creatinine, Ser: 0.65 mg/dL (ref 0.40–1.20)
GFR: 88.37 mL/min (ref >60.00)
Glucose, Bld: 88 mg/dL (ref 70–99)
Potassium: 3.8 meq/L (ref 3.5–5.1)
Sodium: 142 meq/L (ref 135–145)
Total Bilirubin: 0.6 mg/dL (ref 0.2–1.2)
Total Protein: 7.1 g/dL (ref 6.0–8.3)

## 2023-03-12 LAB — LIPID PANEL
Cholesterol: 132 mg/dL (ref 0–200)
HDL: 56.7 mg/dL (ref >39.00)
LDL Cholesterol: 63 mg/dL (ref 0–99)
NonHDL: 75.04
Total CHOL/HDL Ratio: 2
Triglycerides: 59 mg/dL (ref 0.0–149.0)
VLDL: 11.8 mg/dL (ref 0.0–40.0)

## 2023-03-12 LAB — HEMOGLOBIN A1C: Hgb A1c MFr Bld: 6.2 % (ref 4.6–6.5)

## 2023-03-12 MED ORDER — SIMVASTATIN 40 MG PO TABS: ORAL_TABLET | ORAL | 1 refills | Status: DC

## 2023-03-12 MED ORDER — HYDROCHLOROTHIAZIDE 25 MG PO TABS: ORAL_TABLET | ORAL | 1 refills | Status: DC

## 2023-03-12 NOTE — Patient Instructions (Signed)
No medication changes today.  If any concerns on labs I will let you know.  Flu vaccine was given today, but the COVID-vaccine is available at your pharmacy.  Continue follow-up with [facility] medical regarding the pain medication and gabapentin.  Let me know if you change your mind about the lung cancer screening and we can reorder that testing.  Follow-up in 6 months, physical at that time.  Let me know if there are questions in the meantime and take care.

## 2023-03-12 NOTE — Progress Notes (Signed)
Subjective:  Patient ID: [name], female    DOB: 10/30/1950  Age: 73 y.o. MRN: [id number]  CC:  Chief Complaint  Patient presents with   Medical Management of Chronic Issues    Pt is here for 6 month med mgnt Pt reports she has no concerns Pt reports she is fasting    HPI [name] presents for    Chronic pain syndrome: Discussed November 2024.  History of postlaminectomy syndrome, lumbar radiculopathy treated with gabapentin, hydrocodone.  At that time unable to be seen by same provider, new pain management referral placed.  She has received injections from neurosurgery to help with symptoms previously.  Paperwork received recently, not covered on initial referral, but recently referred to [facility] medical but does participate with her insurance. She has been seen by [facility] - appt in next week. Managing gabapentin and hydrocodone. No new se's or falls.   Hypertension: Hydrochlorothiazide 25 mg daily. Home readings: controlled, unknown exact readings.  No med side effects.  BP Readings from Last 3 Encounters:  03/12/23 130/78  01/04/23 138/72  09/07/22 124/66   Lab Results  Component Value Date   CREATININE 0.63 09/07/2022   Hyperlipidemia: Simvastatin 40 mg daily without any side effects or myalgias. Trying to watch diet.  Lab Results  Component Value Date   CHOL 122 09/07/2022   HDL 48.00 09/07/2022   LDLCALC 62 09/07/2022   TRIG 60.0 09/07/2022   CHOLHDL 3 09/07/2022   Lab Results  Component Value Date   ALT 28 09/07/2022   AST 33 09/07/2022   ALKPHOS 67 09/07/2022   BILITOT 0.5 09/07/2022    Prediabetes: Diet/exercise approach.  Weight similar to previous readings. Some sugar beverages at times. Fast food - minimal.  Lab Results  Component Value Date   HGBA1C 6.2 09/07/2022   Wt Readings from Last 3 Encounters:  03/12/23 162 lb (73.5 kg)  01/04/23 161 lb 12.8 oz (73.4 kg)  09/07/22 163 lb 12.8 oz (74.3 kg)   Health  maintenance Repeat referral to lung cancer screening last July, former smoker, 1 pack/day for approximately 45 years quitting in 2017. Was called, but she declined. Declines lung cancer screening. No hemoptysis.   Flu vaccine today.  Immunization History  Administered Date(s) Administered   Fluad Quad(high Dose 65+) 12/17/2021   Influenza Split 11/10/2011, 10/25/2014   Influenza,inj,Quad PF,6+ Mos 12/06/2012, 11/10/2014, 10/15/2016   Influenza-Unspecified 10/10/2013, 11/05/2015, 10/19/2017, 10/07/2018   Moderna Sars-Covid-2 Vaccination 03/25/2019, 04/22/2019   PFIZER(Purple Top)SARS-COV-2 Vaccination 12/05/2019   Pneumococcal Conjugate-13 01/18/2015, 10/19/2017   Pneumococcal Polysaccharide-23 10/24/2018   Pneumococcal-Unspecified 12/15/2004   Tdap 09/02/2006, 11/05/2015   Zoster Recombinant(Shingrix) 08/06/2016, 10/17/2016   Zoster, Live 11/25/2012     History Patient Active Problem List   Diagnosis Date Noted   Thoracic radiculopathy 03/12/2023   Pre-diabetes 01/30/2020   Trochanteric bursitis, left hip 07/02/2016   Lumbar post-laminectomy syndrome 04/08/2016   Essential hypertension 03/16/2016   Hyperlipidemia 03/16/2016   Spinal stenosis of lumbar region 06/07/2014   Degeneration of lumbosacral intervertebral disc 11/23/2013   Acquired scoliosis 08/04/2013   Displacement of lumbar intervertebral disc without myelopathy 04/13/2013   SBO (small bowel obstruction) (HCC) 01/30/2013   Degenerative arthritis of left hip 01/24/2013   Status post THR (total hip replacement) 01/24/2013   Past Medical History:  Diagnosis Date   Arthritis 08/04/2021   left shoulder   Chronic bronchitis (HCC)    "when I get a cold I normally get bronchitis" (01/30/2013)  Hepatitis C    treated and states negative now on 08/04/21   Hyperlipidemia    Hypertension    Past Surgical History:  Procedure Laterality Date   ABDOMINAL HYSTERECTOMY  10/10/1988   BACK SURGERY     x5    CHOLECYSTECTOMY  10/10/1988   COLONOSCOPY  06/2015   JOINT REPLACEMENT Left 01/24/2013   hip   LUMBAR SPINE SURGERY  2010; 2011   "tightened up some screws" (01/30/2013)   POSTERIOR LUMBAR FUSION  2006 X 2; 2008   TOTAL HIP ARTHROPLASTY Left 01/24/2013   Procedure: LEFT TOTAL HIP ARTHROPLASTY ANTERIOR APPROACH;  Surgeon: Kathryne Hitch, MD;  Location: [facility] OR;  Service: Orthopedics;  Laterality: Left;   TUBAL LIGATION  10/10/1968   Allergies  Allergen Reactions   No Known Allergies    Prior to Admission medications   Medication Sig Start Date End Date Taking? Authorizing Provider  aspirin 81 MG tablet Take 81 mg by mouth daily.   Yes [provider]  gabapentin (NEURONTIN) 100 MG capsule Take 100 mg by mouth daily. 03/05/20  Yes [provider]  hydrochlorothiazide (HYDRODIURIL) 25 MG tablet TAKE 1 TABLET BY MOUTH DAILY 09/07/22  Yes Shade Flood, MD  HYDROcodone-acetaminophen (NORCO) 10-325 MG tablet Take 1 tablet by mouth every 8 (eight) hours as needed (pain).    Yes [provider]  methocarbamol (ROBAXIN) 500 MG tablet Take 0.5-1 tablets (250-500 mg total) by mouth every 8 (eight) hours as needed for muscle spasms. 04/15/22  Yes Benjiman Core, MD  Misc. Devices [facility]) MISC 1 Device by Does not apply route as needed. Needed for low back pain, history of lumbar surgery, rollator insufficient for walking due to bursitis and back pain. 08/27/16  Yes Shade Flood, MD  Multiple Vitamins-Minerals (MULTIVITAMIN WOMEN 50+ PO) Take 1 tablet by mouth daily.   Yes [provider]  simvastatin (ZOCOR) 40 MG tablet TAKE 1 TABLET(40 MG) BY MOUTH DAILY 01/25/23  Yes Shade Flood, MD  VALIUM 5 MG tablet 1-2 PO 30-45 minutes prior to procedure 10/21/22  Yes [provider]  ZTLIDO 1.8 % [facility] Apply 1 patch topically daily. 07/14/21  Yes [provider]   Social History   Socioeconomic History   Marital status: Widowed     Spouse name: Not on file   Number of children: Not on file   Years of education: Not on file   Highest education level: GED or equivalent  Occupational History   Occupation: retired  Tobacco Use   Smoking status: Former    Current packs/day: 0.00    Average packs/day: 1 pack/day for 45.0 years (45.0 ttl pk-yrs)    Types: Cigarettes    Start date: 05/25/1970    Quit date: 05/25/2015    Years since quitting: 7.8   Smokeless tobacco: Never  Vaping Use   Vaping status: Never Used  Substance and Sexual Activity   Alcohol use: No    Alcohol/week: 0.0 standard drinks of alcohol    Comment: 01/30/2013 "used to drink beer; none since ~ 1989"   Drug use: Never   Sexual activity: Yes    Birth control/protection: Post-menopausal  Other Topics Concern   Not on file  Social History Narrative   Not on file   Social Drivers of Health   Financial Resource Strain: Medium Risk (03/11/2023)   Overall Financial Resource Strain (CARDIA)    Difficulty of Paying Living Expenses: Somewhat hard  Food Insecurity: Food Insecurity Present (03/11/2023)  Hunger Vital Sign    Worried About Running Out of Food in the Last Year: Sometimes true    Ran Out of Food in the Last Year: Sometimes true  Transportation Needs: No Transportation Needs (01/04/2023)   PRAPARE - Administrator, Civil Service (Medical): No    Lack of Transportation (Non-Medical): No  Physical Activity: Insufficiently Active (03/11/2023)   Exercise Vital Sign    Days of Exercise per Week: 2 days    Minutes of Exercise per Session: 10 min  Stress: No Stress Concern Present (03/11/2023)   [employer] of Occupational Health - Occupational Stress Questionnaire    Feeling of Stress : Not at all  Social Connections: Moderately Integrated (03/11/2023)   Social Connection and Isolation Panel [NHANES]    Frequency of Communication with Friends and Family: More than three times a week    Frequency of Social Gatherings with  Friends and Family: Three times a week    Attends Religious Services: More than 4 times per year    Active Member of Clubs or Organizations: Yes    Attends Banker Meetings: More than 4 times per year    Marital Status: Widowed  Intimate Partner Violence: Not At Risk (12/03/2022)   Humiliation, Afraid, Rape, and Kick questionnaire    Fear of Current or Ex-Partner: No    Emotionally Abused: No    Physically Abused: No    Sexually Abused: No    Review of Systems  Constitutional:  Negative for fatigue and unexpected weight change.  Respiratory:  Negative for chest tightness and shortness of breath.   Cardiovascular:  Negative for chest pain, palpitations and leg swelling.  Gastrointestinal:  Negative for abdominal pain and blood in stool.  Neurological:  Negative for dizziness, syncope, light-headedness and headaches.     Objective:   Vitals:   03/12/23 0756  BP: 130/78  Pulse: 60  Temp: 98.3 F (36.8 C)  SpO2: 95%  Weight: 162 lb (73.5 kg)  Height: 5\' 5"  (1.651 m)     Physical Exam Vitals reviewed.  Constitutional:      Appearance: Normal appearance. She is well-developed.  HENT:     Head: Normocephalic and atraumatic.  Eyes:     Conjunctiva/sclera: Conjunctivae normal.     Pupils: Pupils are equal, round, and reactive to light.  Neck:     Vascular: No carotid bruit.  Cardiovascular:     Rate and Rhythm: Normal rate and regular rhythm.     Heart sounds: Normal heart sounds.  Pulmonary:     Effort: Pulmonary effort is normal.     Breath sounds: Normal breath sounds.  Abdominal:     Palpations: Abdomen is soft. There is no pulsatile mass.     Tenderness: There is no abdominal tenderness.  Musculoskeletal:     Right lower leg: No edema.     Left lower leg: No edema.  Skin:    General: Skin is warm and dry.  Neurological:     Mental Status: She is alert and oriented to person, place, and time.  Psychiatric:        Mood and Affect: Mood normal.         Behavior: Behavior normal.     Assessment & Plan:  [name] is a 73 y.o. female . Prediabetes - Plan: Hemoglobin A1c  -Minimize sugar containing beverages, caution with diet, check A1c, adjust plan accordingly.  No new meds for now.  Essential hypertension - Plan: hydrochlorothiazide (  HYDRODIURIL) 25 MG tablet, Comprehensive metabolic panel  -Stable and reports home readings stable as well.  No new side effects with meds as above, check labs and adjust plan accordingly, continue HCTZ same dose for now.  Hyperlipidemia, unspecified hyperlipidemia type - Plan: simvastatin (ZOCOR) 40 MG tablet, Comprehensive metabolic panel, Lipid panel  -Tolerating simvastatin, check labs and adjust plan accordingly.  Continue same dose  Needs flu shot - Plan: Flu Vaccine Trivalent High Dose (Fluad)  Health maintenance Flu vaccine given today, COVID booster recommended at pharmacy, lung cancer screening recommended, discussed, but she declines at this time.  Meds ordered this encounter  Medications   hydrochlorothiazide (HYDRODIURIL) 25 MG tablet    Sig: TAKE 1 TABLET BY MOUTH DAILY    Dispense:  90 tablet    Refill:  1   simvastatin (ZOCOR) 40 MG tablet    Sig: TAKE 1 TABLET(40 MG) BY MOUTH DAILY    Dispense:  90 tablet    Refill:  1   Patient Instructions  No medication changes today.  If any concerns on labs I will let you know.  Flu vaccine was given today, but the COVID-vaccine is available at your pharmacy.  Continue follow-up with [facility] medical regarding the pain medication and gabapentin.  Let me know if you change your mind about the lung cancer screening and we can reorder that testing.  Follow-up in 6 months, physical at that time.  Let me know if there are questions in the meantime and take care.     Signed,   Meredith Staggers, MD [facility], [facility] 03/12/23 8:32 AM

## 2023-03-14 ENCOUNTER — Encounter: Payer: Self-pay | Admitting: Family Medicine

## 2023-03-23 DIAGNOSIS — M25512 Pain in left shoulder: Secondary | ICD-10-CM | POA: Diagnosis not present

## 2023-03-23 DIAGNOSIS — M51369 Other intervertebral disc degeneration, lumbar region without mention of lumbar back pain or lower extremity pain: Secondary | ICD-10-CM | POA: Diagnosis not present

## 2023-03-23 DIAGNOSIS — E559 Vitamin D deficiency, unspecified: Secondary | ICD-10-CM | POA: Diagnosis not present

## 2023-03-23 DIAGNOSIS — I10 Essential (primary) hypertension: Secondary | ICD-10-CM | POA: Diagnosis not present

## 2023-03-23 DIAGNOSIS — M5134 Other intervertebral disc degeneration, thoracic region: Secondary | ICD-10-CM | POA: Diagnosis not present

## 2023-03-23 DIAGNOSIS — Z79899 Other long term (current) drug therapy: Secondary | ICD-10-CM | POA: Diagnosis not present

## 2023-03-23 DIAGNOSIS — Z6827 Body mass index (BMI) 27.0-27.9, adult: Secondary | ICD-10-CM | POA: Diagnosis not present

## 2023-03-24 DIAGNOSIS — M5414 Radiculopathy, thoracic region: Secondary | ICD-10-CM | POA: Diagnosis not present

## 2023-03-30 ENCOUNTER — Encounter: Payer: Self-pay | Admitting: Family Medicine

## 2023-04-21 DIAGNOSIS — M5414 Radiculopathy, thoracic region: Secondary | ICD-10-CM | POA: Diagnosis not present

## 2023-05-04 DIAGNOSIS — M51369 Other intervertebral disc degeneration, lumbar region without mention of lumbar back pain or lower extremity pain: Secondary | ICD-10-CM | POA: Diagnosis not present

## 2023-05-04 DIAGNOSIS — Z79899 Other long term (current) drug therapy: Secondary | ICD-10-CM | POA: Diagnosis not present

## 2023-05-04 DIAGNOSIS — I10 Essential (primary) hypertension: Secondary | ICD-10-CM | POA: Diagnosis not present

## 2023-05-04 DIAGNOSIS — M5134 Other intervertebral disc degeneration, thoracic region: Secondary | ICD-10-CM | POA: Diagnosis not present

## 2023-05-06 DIAGNOSIS — Z79899 Other long term (current) drug therapy: Secondary | ICD-10-CM | POA: Diagnosis not present

## 2023-05-28 ENCOUNTER — Other Ambulatory Visit: Payer: Self-pay | Admitting: Family Medicine

## 2023-05-28 NOTE — Telephone Encounter (Signed)
 Copied from CRM [id number]. Topic: Clinical - Medication Refill >> May 28, 2023  1:38 PM Jinnie BROCKS wrote: Most Recent Primary Care Visit:  Provider: LEVORA PURCHASE R  Department: [facility]  Visit Type: OFFICE VISIT  Date: 03/12/2023  Medication: gabapentin  (NEURONTIN ) 100 MG capsule   Has the patient contacted their pharmacy? Yes (Agent: If no, request that the patient contact the pharmacy for the refill. If patient does not wish to contact the pharmacy document the reason why and proceed with request.) (Agent: If yes, when and what did the pharmacy advise?)  Is this the correct pharmacy for this prescription? Yes If no, delete pharmacy and type the correct one.  This is the patient's preferred pharmacy:  [facility] [address] [place] - [address] [place] [address] Phone: [phone] Fax: [phone]   Has the prescription been filled recently? No  Is the patient out of the medication? Yes  Has the patient been seen for an appointment in the last year OR does the patient have an upcoming appointment? Yes  Can we respond through MyChart? Yes  Agent: Please be advised that Rx refills may take up to 3 business days. We ask that you follow-up with your pharmacy.

## 2023-06-03 DIAGNOSIS — M5134 Other intervertebral disc degeneration, thoracic region: Secondary | ICD-10-CM | POA: Diagnosis not present

## 2023-06-03 DIAGNOSIS — I10 Essential (primary) hypertension: Secondary | ICD-10-CM | POA: Diagnosis not present

## 2023-06-03 DIAGNOSIS — M51369 Other intervertebral disc degeneration, lumbar region without mention of lumbar back pain or lower extremity pain: Secondary | ICD-10-CM | POA: Diagnosis not present

## 2023-06-03 DIAGNOSIS — E559 Vitamin D deficiency, unspecified: Secondary | ICD-10-CM | POA: Diagnosis not present

## 2023-06-03 DIAGNOSIS — Z79899 Other long term (current) drug therapy: Secondary | ICD-10-CM | POA: Diagnosis not present

## 2023-06-03 DIAGNOSIS — M25512 Pain in left shoulder: Secondary | ICD-10-CM | POA: Diagnosis not present

## 2023-06-03 DIAGNOSIS — Z6827 Body mass index (BMI) 27.0-27.9, adult: Secondary | ICD-10-CM | POA: Diagnosis not present

## 2023-06-03 DIAGNOSIS — Z6826 Body mass index (BMI) 26.0-26.9, adult: Secondary | ICD-10-CM | POA: Diagnosis not present

## 2023-07-02 DIAGNOSIS — Z79899 Other long term (current) drug therapy: Secondary | ICD-10-CM | POA: Diagnosis not present

## 2023-07-02 DIAGNOSIS — M5134 Other intervertebral disc degeneration, thoracic region: Secondary | ICD-10-CM | POA: Diagnosis not present

## 2023-07-02 DIAGNOSIS — Z6827 Body mass index (BMI) 27.0-27.9, adult: Secondary | ICD-10-CM | POA: Diagnosis not present

## 2023-07-02 DIAGNOSIS — I10 Essential (primary) hypertension: Secondary | ICD-10-CM | POA: Diagnosis not present

## 2023-07-02 DIAGNOSIS — E559 Vitamin D deficiency, unspecified: Secondary | ICD-10-CM | POA: Diagnosis not present

## 2023-07-02 DIAGNOSIS — R059 Cough, unspecified: Secondary | ICD-10-CM | POA: Diagnosis not present

## 2023-07-02 DIAGNOSIS — U071 COVID-19: Secondary | ICD-10-CM | POA: Diagnosis not present

## 2023-07-02 DIAGNOSIS — M51369 Other intervertebral disc degeneration, lumbar region without mention of lumbar back pain or lower extremity pain: Secondary | ICD-10-CM | POA: Diagnosis not present

## 2023-07-02 DIAGNOSIS — M25512 Pain in left shoulder: Secondary | ICD-10-CM | POA: Diagnosis not present

## 2023-07-19 ENCOUNTER — Other Ambulatory Visit: Payer: Self-pay | Admitting: Family Medicine

## 2023-07-19 DIAGNOSIS — E785 Hyperlipidemia, unspecified: Secondary | ICD-10-CM

## 2023-07-26 DIAGNOSIS — M542 Cervicalgia: Secondary | ICD-10-CM | POA: Diagnosis not present

## 2023-07-26 DIAGNOSIS — M546 Pain in thoracic spine: Secondary | ICD-10-CM | POA: Diagnosis not present

## 2023-07-26 DIAGNOSIS — M5416 Radiculopathy, lumbar region: Secondary | ICD-10-CM | POA: Diagnosis not present

## 2023-07-26 DIAGNOSIS — M25512 Pain in left shoulder: Secondary | ICD-10-CM | POA: Diagnosis not present

## 2023-07-27 ENCOUNTER — Other Ambulatory Visit: Payer: Self-pay | Admitting: Family Medicine

## 2023-07-27 DIAGNOSIS — I10 Essential (primary) hypertension: Secondary | ICD-10-CM

## 2023-07-27 MED ORDER — HYDROCHLOROTHIAZIDE 25 MG PO TABS: ORAL_TABLET | ORAL | 1 refills | Status: DC

## 2023-07-27 MED ORDER — GABAPENTIN 100 MG PO CAPS: 100.0000 mg | ORAL_CAPSULE | Freq: Every day | ORAL | 1 refills | Status: AC

## 2023-07-27 NOTE — Telephone Encounter (Unsigned)
 Copied from CRM [id number]. Topic: Clinical - Medication Refill >> Jul 27, 2023  4:08 PM Pam Bode wrote: Medication: aspirin  81 MG tablet gabapentin  (NEURONTIN ) 100 MG capsule  hydrochlorothiazide  (HYDRODIURIL ) 25 MG tablet   Has the patient contacted their pharmacy? Yes (Agent: If no, request that the patient contact the pharmacy for the refill. If patient does not wish to contact the pharmacy document the reason why and proceed with request.) (Agent: If yes, when and what did the pharmacy advise?)  This is the patient's preferred pharmacy:   [facility] - [place], [place] - [address] [place] [address] Phone: [phone] Fax: [phone]   Is this the correct pharmacy for this prescription? Yes If no, delete pharmacy and type the correct one.   Has the prescription been filled recently? Yes  Is the patient out of the medication? Yes  Has the patient been seen for an appointment in the last year OR does the patient have an upcoming appointment? Yes  Can we respond through MyChart? Yes  Agent: Please be advised that Rx refills may take up to 3 business days. We ask that you follow-up with your pharmacy.

## 2023-08-12 DIAGNOSIS — M5416 Radiculopathy, lumbar region: Secondary | ICD-10-CM | POA: Diagnosis not present

## 2023-09-24 ENCOUNTER — Ambulatory Visit (INDEPENDENT_AMBULATORY_CARE_PROVIDER_SITE_OTHER): Payer: Medicare HMO | Admitting: Family Medicine

## 2023-09-24 VITALS — BP 120/78 | HR 63 | Ht 64.0 in | Wt 162.0 lb

## 2023-09-24 DIAGNOSIS — I10 Essential (primary) hypertension: Secondary | ICD-10-CM

## 2023-09-24 DIAGNOSIS — Z0000 Encounter for general adult medical examination without abnormal findings: Secondary | ICD-10-CM | POA: Diagnosis not present

## 2023-09-24 DIAGNOSIS — E785 Hyperlipidemia, unspecified: Secondary | ICD-10-CM

## 2023-09-24 DIAGNOSIS — R7303 Prediabetes: Secondary | ICD-10-CM

## 2023-09-24 DIAGNOSIS — M8580 Other specified disorders of bone density and structure, unspecified site: Secondary | ICD-10-CM

## 2023-09-24 DIAGNOSIS — G894 Chronic pain syndrome: Secondary | ICD-10-CM

## 2023-09-24 LAB — LIPID PANEL
Cholesterol: 127 mg/dL (ref 0–200)
HDL: 57 mg/dL (ref >39.00)
LDL Cholesterol: 59 mg/dL (ref 0–99)
NonHDL: 69.77
Total CHOL/HDL Ratio: 2
Triglycerides: 55 mg/dL (ref 0.0–149.0)
VLDL: 11 mg/dL (ref 0.0–40.0)

## 2023-09-24 LAB — COMPREHENSIVE METABOLIC PANEL WITH GFR
ALT: 23 U/L (ref 0–35)
AST: 30 U/L (ref 0–37)
Albumin: 4.4 g/dL (ref 3.5–5.2)
Alkaline Phosphatase: 74 U/L (ref 39–117)
BUN: 15 mg/dL (ref 6–23)
CO2: 31 meq/L (ref 19–32)
Calcium: 9.8 mg/dL (ref 8.4–10.5)
Chloride: 103 meq/L (ref 96–112)
Creatinine, Ser: 0.6 mg/dL (ref 0.40–1.20)
GFR: 89.75 mL/min (ref >60.00)
Glucose, Bld: 103 mg/dL — ABNORMAL HIGH (ref 70–99)
Potassium: 3.6 meq/L (ref 3.5–5.1)
Sodium: 141 meq/L (ref 135–145)
Total Bilirubin: 0.5 mg/dL (ref 0.2–1.2)
Total Protein: 7.6 g/dL (ref 6.0–8.3)

## 2023-09-24 LAB — HEMOGLOBIN A1C: Hgb A1c MFr Bld: 6.3 % (ref 4.6–6.5)

## 2023-09-24 MED ORDER — OYSTER SHELL CALCIUM/D3 500-5 MG-MCG PO TABS: 1.0000 | ORAL_TABLET | Freq: Two times a day (BID) | ORAL | 6 refills | Status: AC

## 2023-09-24 NOTE — Patient Instructions (Addendum)
 Based on our discussion today, I think it would be reasonable to stop aspirin  based on your age and current reason for use.  Let me know if you have questions regarding this plan.  I did place an order for the calcium  and vitamin D supplement, but that is usually over-the-counter.  You can ask the pharmacist for a less expensive option if that is not covered by insurance.  We are using this to help protect your bones as we thought thin bones on your bone density test last year which should be repeated next year.  No other medication changes at this time.  I will check labs today and let you know if there are any concerns.    Preventive Care 58 Years and Older, Female Preventive care refers to lifestyle choices and visits with your health care provider that can promote health and wellness. Preventive care visits are also called wellness exams. What can I expect for my preventive care visit? Counseling Your health care provider may ask you questions about your: Medical history, including: Past medical problems. Family medical history. Pregnancy and menstrual history. History of falls. Current health, including: Memory and ability to understand (cognition). Emotional well-being. Home life and relationship well-being. Sexual activity and sexual health. Lifestyle, including: Alcohol , nicotine  or tobacco, and drug use. Access to firearms. Diet, exercise, and sleep habits. Work and work Astronomer. Sunscreen use. Safety issues such as seatbelt and bike helmet use. Physical exam Your health care provider will check your: Height and weight. These may be used to calculate your BMI (body mass index). BMI is a measurement that tells if you are at a healthy weight. Waist circumference. This measures the distance around your waistline. This measurement also tells if you are at a healthy weight and may help predict your risk of certain diseases, such as type 2 diabetes and high blood pressure. Heart  rate and blood pressure. Body temperature. Skin for abnormal spots. What immunizations do I need?  Vaccines are usually given at various ages, according to a schedule. Your health care provider will recommend vaccines for you based on your age, medical history, and lifestyle or other factors, such as travel or where you work. What tests do I need? Screening Your health care provider may recommend screening tests for certain conditions. This may include: Lipid and cholesterol levels. Hepatitis C test. Hepatitis B test. HIV (human immunodeficiency virus) test. STI (sexually transmitted infection) testing, if you are at risk. Lung cancer screening. Colorectal cancer screening. Diabetes screening. This is done by checking your blood sugar (glucose) after you have not eaten for a while (fasting). Mammogram. Talk with your health care provider about how often you should have regular mammograms. BRCA-related cancer screening. This may be done if you have a family history of breast, ovarian, tubal, or peritoneal cancers. Bone density scan. This is done to screen for osteoporosis. Talk with your health care provider about your test results, treatment options, and if necessary, the need for more tests. Follow these instructions at home: Eating and drinking  Eat a diet that includes fresh fruits and vegetables, whole grains, lean protein, and low-fat dairy products. Limit your intake of foods with high amounts of sugar, saturated fats, and salt. Take vitamin and mineral supplements as recommended by your health care provider. Do not drink alcohol  if your health care provider tells you not to drink. If you drink alcohol : Limit how much you have to 0-1 drink a day. Know how much alcohol  is in your  drink. In the [place] one drink equals one 12 oz bottle of beer (355 mL), one 5 oz glass of wine (148 mL), or one 1 oz glass of hard liquor (44 mL). Lifestyle Brush your teeth every morning and night with  fluoride toothpaste. Floss one time each day. Exercise for at least 30 minutes 5 or more days each week. Do not use any products that contain nicotine  or tobacco. These products include cigarettes, chewing tobacco, and vaping devices, such as e-cigarettes. If you need help quitting, ask your health care provider. Do not use drugs. If you are sexually active, practice safe sex. Use a condom or other form of protection in order to prevent STIs. Take aspirin  only as told by your health care provider. Make sure that you understand how much to take and what form to take. Work with your health care provider to find out whether it is safe and beneficial for you to take aspirin  daily. Ask your health care provider if you need to take a cholesterol-lowering medicine (statin). Find healthy ways to manage stress, such as: Meditation, yoga, or listening to music. Journaling. Talking to a trusted person. Spending time with friends and family. Minimize exposure to UV radiation to reduce your risk of skin cancer. Safety Always wear your seat belt while driving or riding in a vehicle. Do not drive: If you have been drinking alcohol . Do not ride with someone who has been drinking. When you are tired or distracted. While texting. If you have been using any mind-altering substances or drugs. Wear a helmet and other protective equipment during sports activities. If you have firearms in your house, make sure you follow all gun safety procedures. What's next? Visit your health care provider once a year for an annual wellness visit. Ask your health care provider how often you should have your eyes and teeth checked. Stay up to date on all vaccines. This information is not intended to replace advice given to you by your health care provider. Make sure you discuss any questions you have with your health care provider. Document Revised: 07/24/2020 Document Reviewed: 07/24/2020 Elsevier Patient Education  2024  [employer].

## 2023-09-24 NOTE — Progress Notes (Signed)
 Subjective:  Patient ID: [name], female    DOB: 09-27-1950  Age: 73 y.o. MRN: [id number]  CC:  Chief Complaint  Patient presents with   Annual Exam    No concerns patient states but is wondering if she can have a written prescription for asprin.     HPI [name] presents for Annual Exam No health changes or concerns.   PCP, me Pain medicine, Dr. Darlis,  thoracic and lumbar radiculopathy postlaminectomy syndrome, lumbar radiculopathy treated with gabapentin  and hydrocodone  by pain mgt at [facility]. Dr. Darlis performs injections. Had bloodwork at [facility].  Ortho, Dr. Addie, left shoulder pain, radiculopathy of cervical and lumbar region.   Prediabetes: With diet and exercise approach.  Weight stable from January.  We have discussed avoidance of sugar containing beverages and minimal fast food in the past. Still trying to avoid - less soda and fast food.  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 Filing in office. Walking.  Lab Results  Component Value Date   HGBA1C 6.2 03/12/2023   Wt Readings from Last 3 Encounters:  09/24/23 162 lb (73.5 kg)  03/12/23 162 lb (73.5 kg)  01/04/23 161 lb 12.8 oz (73.4 kg)   Hypertension: Hydrochlorothiazide  25 mg daily. No new side effects.  Home readings: none recent.  BP Readings from Last 3 Encounters:  09/24/23 120/78  03/12/23 130/78  01/04/23 138/72   Lab Results  Component Value Date   CREATININE 0.65 03/12/2023   Hyperlipidemia: Simvastatin  40 mg daily without new myalgias or side effects. Taking aspirin  81 mg every day. No new bruising'/bleeding. Plans to stop taking ASA if not needed.  No hx of cva, or MI/known CAD. No hx of PUD/GI bleed.  Aortic atherosclerosis on prior CXR. - moderate ascvd risk - 8.3% 14yr.  Based on discussion of risk vs benefit and info form Upto Date - she will stop ASA at this time for primary prevention..  The 10-year ASCVD risk score (Arnett  DK, et al., 2019) is: 8.3%   Values used to calculate the score:     Age: 35 years     Clincally relevant sex: Female     Is Non-Hispanic African American: Yes     Diabetic: No     Tobacco smoker: No     Systolic Blood Pressure: 120 mmHg     Is BP treated: Yes     HDL Cholesterol: 56.7 mg/dL     Total Cholesterol: 132 mg/dL   Lab Results  Component Value Date   CHOL 132 03/12/2023   HDL 56.70 03/12/2023   LDLCALC 63 03/12/2023   TRIG 59.0 03/12/2023   CHOLHDL 2 03/12/2023   Lab Results  Component Value Date   ALT 25 03/12/2023   AST 32 03/12/2023   ALKPHOS 73 03/12/2023   BILITOT 0.6 03/12/2023         09/24/2023    9:05 AM 03/12/2023    7:55 AM 12/03/2022    8:43 AM 09/07/2022    8:26 AM 03/09/2022    8:38 AM  Depression screen PHQ 2/9  Decreased Interest 0 0 0 0 0  Down, Depressed, Hopeless 0 0 0 0 0  PHQ - 2 Score 0 0 0 0 0  Altered sleeping 0 0 0 0 0  Tired, decreased energy 0 0 0 0 0  Change in appetite 0 0 0 0 0  Feeling bad or failure about yourself  0 0  0 0 0  Trouble concentrating 0 0 0 0 0  Moving slowly or fidgety/restless 0 0 0 0 0  Suicidal thoughts 0 0 0 0 0  PHQ-9 Score 0 0 0 0 0  Difficult doing work/chores Not difficult at all Not difficult at all Not difficult at all  Not difficult at all    Health Maintenance  Topic Date Due   Lung Cancer Screening  10/27/2014   INFLUENZA VACCINE  09/10/2023   Medicare Annual Wellness (AWV)  03/07/2024   MAMMOGRAM  11/26/2024   DTaP/Tdap/Td (3 - Td or Tdap) 11/04/2025   Colonoscopy  08/27/2026   Pneumococcal Vaccine: 50+ Years  Completed   DEXA SCAN  Completed   Hepatitis C Screening  Completed   Zoster Vaccines- Shingrix  Completed   HPV VACCINES  Aged Out   Meningococcal B Vaccine  Aged Out   COVID-19 Vaccine  Discontinued  Colonoscopy 2023.  Diverticulosis, mild melanosis, no polyps, repeat colonoscopy was not recommended for surveillance. Mammogram October 2024, no evidence of malignancy Bone  density May 2024.  Osteopenia, slightly worse compared to 2018.  Calcium  and vitamin D recommended with 2-year repeat. Not taking supplement.  With her smoking history, 1 pack/day for 45 years quitting in 2017, referred to lung cancer screening previously.  Was called but she declined and declines lung cancer screening at this time.  Denies hemoptysis or new cough.  Immunization History  Administered Date(s) Administered   Fluad Quad(high Dose 65+) 12/17/2021   Fluad Trivalent(High Dose 65+) 03/12/2023   Influenza Split 11/10/2011, 10/25/2014   Influenza,inj,Quad PF,6+ Mos 12/06/2012, 11/10/2014, 10/15/2016   Influenza-Unspecified 10/10/2013, 11/05/2015, 10/19/2017, 10/07/2018   Moderna Sars-Covid-2 Vaccination 03/25/2019, 04/22/2019   PFIZER(Purple Top)SARS-COV-2 Vaccination 12/05/2019   Pneumococcal Conjugate-13 01/18/2015, 10/19/2017   Pneumococcal Polysaccharide-23 10/24/2018   Pneumococcal-Unspecified 12/15/2004   Tdap 09/02/2006, 11/05/2015   Zoster Recombinant(Shingrix) 08/06/2016, 10/17/2016   Zoster, Live 11/25/2012  Flu and covid vaccines in fall recommended - plans at pharmacy.   No results found. Wears glasses -planned appt soon.   Dental: has dentist - as needed appts, all dentures.   Alcohol :none  Tobacco: none  Exercise: as above.   History Patient Active Problem List   Diagnosis Date Noted   Thoracic radiculopathy 03/12/2023   Pre-diabetes 01/30/2020   Trochanteric bursitis, left hip 07/02/2016   Lumbar post-laminectomy syndrome 04/08/2016   Essential hypertension 03/16/2016   Hyperlipidemia 03/16/2016   Spinal stenosis of lumbar region 06/07/2014   Degeneration of lumbosacral intervertebral disc 11/23/2013   Acquired scoliosis 08/04/2013   Displacement of lumbar intervertebral disc without myelopathy 04/13/2013   SBO (small bowel obstruction) (HCC) 01/30/2013   Degenerative arthritis of left hip 01/24/2013   Status post THR (total hip replacement)  01/24/2013   Past Medical History:  Diagnosis Date   Arthritis 08/04/2021   left shoulder   Chronic bronchitis (HCC)    when I get a cold I normally get bronchitis (01/30/2013)   Hepatitis C    treated and states negative now on 08/04/21   Hyperlipidemia    Hypertension    Past Surgical History:  Procedure Laterality Date   ABDOMINAL HYSTERECTOMY  10/10/1988   BACK SURGERY     x5   CHOLECYSTECTOMY  10/10/1988   COLONOSCOPY  06/2015   JOINT REPLACEMENT Left 01/24/2013   hip   LUMBAR SPINE SURGERY  2010; 2011   tightened up some screws (01/30/2013)   POSTERIOR LUMBAR FUSION  2006 X 2; 2008   TOTAL HIP ARTHROPLASTY  Left 01/24/2013   Procedure: LEFT TOTAL HIP ARTHROPLASTY ANTERIOR APPROACH;  Surgeon: Lonni CINDERELLA Poli, MD;  Location: [facility];  Service: Orthopedics;  Laterality: Left;   TUBAL LIGATION  10/10/1968   Allergies  Allergen Reactions   No Known Allergies    Prior to Admission medications   Medication Sig Start Date End Date Taking? Authorizing Provider  aspirin  81 MG tablet Take 81 mg by mouth daily.   Yes [provider]  gabapentin  (NEURONTIN ) 100 MG capsule Take 1 capsule (100 mg total) by mouth daily. 07/27/23  Yes Webb, Padonda B, FNP  hydrochlorothiazide  (HYDRODIURIL ) 25 MG tablet TAKE 1 TABLET BY MOUTH DAILY 07/27/23  Yes Webb, Padonda B, FNP  HYDROcodone -acetaminophen  (NORCO) 10-325 MG tablet Take 1 tablet by mouth every 8 (eight) hours as needed (pain).    Yes [provider]  methocarbamol  (ROBAXIN ) 500 MG tablet Take 0.5-1 tablets (250-500 mg total) by mouth every 8 (eight) hours as needed for muscle spasms. 04/15/22  Yes Patsey Lot, MD  Misc. Devices [facility]) MISC 1 Device by Does not apply route as needed. Needed for low back pain, history of lumbar surgery, rollator insufficient for walking due to bursitis and back pain. 08/27/16  Yes Levora Reyes SAUNDERS, MD  Multiple Vitamins-Minerals (MULTIVITAMIN WOMEN 50+ PO) Take 1 tablet  by mouth daily.   Yes [provider]  simvastatin  (ZOCOR ) 40 MG tablet TAKE 1 TABLET EVERY DAY 07/19/23  Yes Levora Reyes SAUNDERS, MD  VALIUM  5 MG tablet 1-2 PO 30-45 minutes prior to procedure 10/21/22  Yes [provider]  ZTLIDO  1.8 % PTCH Apply 1 patch topically daily. 07/14/21  Yes [provider]   Social History   Socioeconomic History   Marital status: Widowed    Spouse name: Not on file   Number of children: Not on file   Years of education: Not on file   Highest education level: GED or equivalent  Occupational History   Occupation: retired  Tobacco Use   Smoking status: Former    Current packs/day: 0.00    Average packs/day: 1 pack/day for 45.0 years (45.0 ttl pk-yrs)    Types: Cigarettes    Start date: 05/25/1970    Quit date: 05/25/2015    Years since quitting: 8.3   Smokeless tobacco: Never  Vaping Use   Vaping status: Never Used  Substance and Sexual Activity   Alcohol  use: No    Alcohol /week: 0.0 standard drinks of alcohol     Comment: 01/30/2013 used to drink beer; none since ~ 1989   Drug use: Never   Sexual activity: Yes    Birth control/protection: Post-menopausal  Other Topics Concern   Not on file  Social History Narrative   Not on file   Social Drivers of Health   Financial Resource Strain: Medium Risk (03/11/2023)   Overall Financial Resource Strain (CARDIA)    Difficulty of Paying Living Expenses: Somewhat hard  Food Insecurity: Food Insecurity Present (03/11/2023)   Hunger Vital Sign    Worried About Running Out of Food in the Last Year: Sometimes true    Ran Out of Food in the Last Year: Sometimes true  Transportation Needs: No Transportation Needs (01/04/2023)   PRAPARE - Administrator, Civil Service (Medical): No    Lack of Transportation (Non-Medical): No  Physical Activity: Insufficiently Active (03/11/2023)   Exercise Vital Sign    Days of Exercise per Week: 2 days    Minutes of Exercise per Session: 10  min  Stress: No Stress Concern Present (03/11/2023)   [employer] of Occupational Health - Occupational Stress Questionnaire    Feeling of Stress : Not at all  Social Connections: Moderately Integrated (03/11/2023)   Social Connection and Isolation Panel    Frequency of Communication with Friends and Family: More than three times a week    Frequency of Social Gatherings with Friends and Family: Three times a week    Attends Religious Services: More than 4 times per year    Active Member of Clubs or Organizations: Yes    Attends Banker Meetings: More than 4 times per year    Marital Status: Widowed  Intimate Partner Violence: Not At Risk (12/03/2022)   Humiliation, Afraid, Rape, and Kick questionnaire    Fear of Current or Ex-Partner: No    Emotionally Abused: No    Physically Abused: No    Sexually Abused: No    Review of Systems 13 point review of systems per patient health survey noted.  Negative other than as indicated above or in HPI.   Objective:   Vitals:   09/24/23 0900  BP: 120/78  Pulse: 63  SpO2: 99%  Weight: 162 lb (73.5 kg)  Height: 5' 4 (1.626 m)     Physical Exam Constitutional:      Appearance: She is well-developed.  HENT:     Head: Normocephalic and atraumatic.     Right Ear: External ear normal.     Left Ear: External ear normal.  Eyes:     Conjunctiva/sclera: Conjunctivae normal.     Pupils: Pupils are equal, round, and reactive to light.  Neck:     Thyroid : No thyromegaly.  Cardiovascular:     Rate and Rhythm: Normal rate and regular rhythm.     Heart sounds: Normal heart sounds. No murmur heard. Pulmonary:     Effort: Pulmonary effort is normal. No respiratory distress.     Breath sounds: Normal breath sounds. No wheezing.  Abdominal:     General: Bowel sounds are normal.     Palpations: Abdomen is soft.     Tenderness: There is no abdominal tenderness.  Musculoskeletal:        General: No tenderness. Normal range of  motion.     Cervical back: Normal range of motion and neck supple.  Lymphadenopathy:     Cervical: No cervical adenopathy.  Skin:    General: Skin is warm and dry.     Findings: No rash.  Neurological:     Mental Status: She is alert and oriented to person, place, and time.  Psychiatric:        Behavior: Behavior normal.        Thought Content: Thought content normal.        Assessment & Plan:  [name] is a 72 y.o. female . Annual physical exam  - -anticipatory guidance as below in AVS, screening labs above. Health maintenance items as above in HPI discussed/recommended as applicable.   Essential hypertension - Plan: Comprehensive metabolic panel with GFR  Stable, check labs and adjust plan accordingly.  We discussed the use of aspirin  including potential risks versus benefits and based on this discussion and her age she has chosen to stop aspirin  which I think would be reasonable.  Prediabetes - Plan: Hemoglobin A1c - Continue watch diet, exercise, check A1c and adjust plan accordingly  Hyperlipidemia, unspecified hyperlipidemia type - Plan: Comprehensive metabolic panel with GFR, Lipid panel  Tolerating simvastatin , continue same, labs as  above and adjust plan accordingly  Chronic pain syndrome Followed by specialist as above, no changes.  Stable  Osteopenia, unspecified location - Plan: calcium -vitamin D (OSCAL WITH D) 500-5 MG-MCG tablet Calcium , vitamin D discussed with osteopenia previously as well as follow-up and bone density testing interval.  Meds ordered this encounter  Medications   calcium -vitamin D (OSCAL WITH D) 500-5 MG-MCG tablet    Sig: Take 1 tablet by mouth 2 (two) times daily.    Dispense:  60 tablet    Refill:  6   Patient Instructions  Based on our discussion today, I think it would be reasonable to stop aspirin  based on your age and current reason for use.  Let me know if you have questions regarding this plan.  I did place an order for  the calcium  and vitamin D supplement, but that is usually over-the-counter.  You can ask the pharmacist for a less expensive option if that is not covered by insurance.  We are using this to help protect your bones as we thought thin bones on your bone density test last year which should be repeated next year.  No other medication changes at this time.  I will check labs today and let you know if there are any concerns.    Preventive Care 57 Years and Older, Female Preventive care refers to lifestyle choices and visits with your health care provider that can promote health and wellness. Preventive care visits are also called wellness exams. What can I expect for my preventive care visit? Counseling Your health care provider may ask you questions about your: Medical history, including: Past medical problems. Family medical history. Pregnancy and menstrual history. History of falls. Current health, including: Memory and ability to understand (cognition). Emotional well-being. Home life and relationship well-being. Sexual activity and sexual health. Lifestyle, including: Alcohol , nicotine  or tobacco, and drug use. Access to firearms. Diet, exercise, and sleep habits. Work and work Astronomer. Sunscreen use. Safety issues such as seatbelt and bike helmet use. Physical exam Your health care provider will check your: Height and weight. These may be used to calculate your BMI (body mass index). BMI is a measurement that tells if you are at a healthy weight. Waist circumference. This measures the distance around your waistline. This measurement also tells if you are at a healthy weight and may help predict your risk of certain diseases, such as type 2 diabetes and high blood pressure. Heart rate and blood pressure. Body temperature. Skin for abnormal spots. What immunizations do I need?  Vaccines are usually given at various ages, according to a schedule. Your health care provider will  recommend vaccines for you based on your age, medical history, and lifestyle or other factors, such as travel or where you work. What tests do I need? Screening Your health care provider may recommend screening tests for certain conditions. This may include: Lipid and cholesterol levels. Hepatitis C test. Hepatitis B test. HIV (human immunodeficiency virus) test. STI (sexually transmitted infection) testing, if you are at risk. Lung cancer screening. Colorectal cancer screening. Diabetes screening. This is done by checking your blood sugar (glucose) after you have not eaten for a while (fasting). Mammogram. Talk with your health care provider about how often you should have regular mammograms. BRCA-related cancer screening. This may be done if you have a family history of breast, ovarian, tubal, or peritoneal cancers. Bone density scan. This is done to screen for osteoporosis. Talk with your health care provider about your test results,  treatment options, and if necessary, the need for more tests. Follow these instructions at home: Eating and drinking  Eat a diet that includes fresh fruits and vegetables, whole grains, lean protein, and low-fat dairy products. Limit your intake of foods with high amounts of sugar, saturated fats, and salt. Take vitamin and mineral supplements as recommended by your health care provider. Do not drink alcohol  if your health care provider tells you not to drink. If you drink alcohol : Limit how much you have to 0-1 drink a day. Know how much alcohol  is in your drink. In the [place] one drink equals one 12 oz bottle of beer (355 mL), one 5 oz glass of wine (148 mL), or one 1 oz glass of hard liquor (44 mL). Lifestyle Brush your teeth every morning and night with fluoride toothpaste. Floss one time each day. Exercise for at least 30 minutes 5 or more days each week. Do not use any products that contain nicotine  or tobacco. These products include cigarettes,  chewing tobacco, and vaping devices, such as e-cigarettes. If you need help quitting, ask your health care provider. Do not use drugs. If you are sexually active, practice safe sex. Use a condom or other form of protection in order to prevent STIs. Take aspirin  only as told by your health care provider. Make sure that you understand how much to take and what form to take. Work with your health care provider to find out whether it is safe and beneficial for you to take aspirin  daily. Ask your health care provider if you need to take a cholesterol-lowering medicine (statin). Find healthy ways to manage stress, such as: Meditation, yoga, or listening to music. Journaling. Talking to a trusted person. Spending time with friends and family. Minimize exposure to UV radiation to reduce your risk of skin cancer. Safety Always wear your seat belt while driving or riding in a vehicle. Do not drive: If you have been drinking alcohol . Do not ride with someone who has been drinking. When you are tired or distracted. While texting. If you have been using any mind-altering substances or drugs. Wear a helmet and other protective equipment during sports activities. If you have firearms in your house, make sure you follow all gun safety procedures. What's next? Visit your health care provider once a year for an annual wellness visit. Ask your health care provider how often you should have your eyes and teeth checked. Stay up to date on all vaccines. This information is not intended to replace advice given to you by your health care provider. Make sure you discuss any questions you have with your health care provider. Document Revised: 07/24/2020 Document Reviewed: 07/24/2020 Elsevier Patient Education  2024 Elsevier Inc.       Signed,   Reyes Pines, MD  Primary Care, [facility] 09/24/23 10:00 AM

## 2023-09-26 ENCOUNTER — Encounter: Payer: Self-pay | Admitting: Family Medicine

## 2023-09-26 ENCOUNTER — Ambulatory Visit: Payer: Self-pay | Admitting: Family Medicine

## 2023-10-13 ENCOUNTER — Encounter (HOSPITAL_COMMUNITY): Payer: Self-pay

## 2023-10-13 ENCOUNTER — Emergency Department (HOSPITAL_COMMUNITY)
Admission: EM | Admit: 2023-10-13 | Discharge: 2023-10-13 | Disposition: A | Discharge status: Home / Self Care | Attending: Emergency Medicine | Admitting: Emergency Medicine

## 2023-10-13 DIAGNOSIS — M545 Low back pain, unspecified: Secondary | ICD-10-CM | POA: Diagnosis present

## 2023-10-13 DIAGNOSIS — Y92410 Unspecified street and highway as the place of occurrence of the external cause: Secondary | ICD-10-CM | POA: Insufficient documentation

## 2023-10-13 NOTE — Discharge Instructions (Signed)
 Return for any problem.  ?
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
## 2023-10-14 ENCOUNTER — Other Ambulatory Visit: Payer: Self-pay

## 2023-10-14 ENCOUNTER — Encounter (HOSPITAL_COMMUNITY): Payer: Self-pay

## 2023-10-14 ENCOUNTER — Emergency Department (HOSPITAL_COMMUNITY)

## 2023-10-14 ENCOUNTER — Emergency Department (HOSPITAL_COMMUNITY)
Admission: EM | Admit: 2023-10-14 | Discharge: 2023-10-14 | Disposition: A | Discharge status: Home / Self Care | Attending: Emergency Medicine | Admitting: Emergency Medicine

## 2023-10-14 DIAGNOSIS — Z79899 Other long term (current) drug therapy: Secondary | ICD-10-CM | POA: Insufficient documentation

## 2023-10-14 DIAGNOSIS — Y92410 Unspecified street and highway as the place of occurrence of the external cause: Secondary | ICD-10-CM | POA: Insufficient documentation

## 2023-10-14 DIAGNOSIS — M545 Low back pain, unspecified: Secondary | ICD-10-CM | POA: Diagnosis present

## 2023-10-14 DIAGNOSIS — I10 Essential (primary) hypertension: Secondary | ICD-10-CM | POA: Insufficient documentation

## 2023-10-14 MED ORDER — LIDOCAINE 5 % EX PTCH
2.0000 | MEDICATED_PATCH | CUTANEOUS | Status: DC
  Administered 2023-10-14: 2 via TRANSDERMAL
  Filled 2023-10-14 (×2): qty 2

## 2023-10-14 MED ORDER — LIDOCAINE 5 % EX PTCH: 1.0000 | MEDICATED_PATCH | CUTANEOUS | 0 refills | Status: AC
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
## 2023-12-08 ENCOUNTER — Ambulatory Visit: Payer: Medicare HMO

## 2023-12-08 VITALS — Ht 64.0 in | Wt 162.0 lb

## 2023-12-08 DIAGNOSIS — Z0000 Encounter for general adult medical examination without abnormal findings: Secondary | ICD-10-CM | POA: Diagnosis not present

## 2023-12-08 NOTE — Patient Instructions (Signed)
 [name],  Thank you for taking the time for your Medicare Wellness Visit. I appreciate your continued commitment to your health goals. Please review the care plan we discussed, and feel free to reach out if I can assist you further.  Medicare recommends these wellness visits once per year to help you and your care team stay ahead of potential health issues. These visits are designed to focus on prevention, allowing your provider to concentrate on managing your acute and chronic conditions during your regular appointments.  Please note that Annual Wellness Visits do not include a physical exam. Some assessments may be limited, especially if the visit was conducted virtually. If needed, we may recommend a separate in-person follow-up with your provider.  Ongoing Care Seeing your primary care provider every 3 to 6 months helps us  monitor your health and provide consistent, personalized care. Last office visit on 09/24/2023.  Keep up the good work.  Remember to call office to pick up form to up date the handicap sticker.  Referrals If a referral was made during today's visit and you haven't received any updates within two weeks, please contact the referred provider directly to check on the status.  Recommended Screenings:  Health Maintenance  Topic Date Due   Screening for Lung Cancer  10/27/2014   Flu Shot  09/10/2023   Breast Cancer Screening  11/26/2024   Medicare Annual Wellness Visit  12/07/2024   DTaP/Tdap/Td vaccine (3 - Td or Tdap) 11/04/2025   Colon Cancer Screening  08/27/2026   Pneumococcal Vaccine for age over 61  Completed   DEXA scan (bone density measurement)  Completed   Hepatitis C Screening  Completed   Zoster (Shingles) Vaccine  Completed   Meningitis B Vaccine  Aged Out   COVID-19 Vaccine  Discontinued       12/08/2023   10:12 AM  Advanced Directives  Does Patient Have a Medical Advance Directive? No   Advance Care Planning is important because it: Ensures you  receive medical care that aligns with your values, goals, and preferences. Provides guidance to your family and loved ones, reducing the emotional burden of decision-making during critical moments.  Vision: Annual vision screenings are recommended for early detection of glaucoma, cataracts, and diabetic retinopathy. These exams can also reveal signs of chronic conditions such as diabetes and high blood pressure.  Dental: Annual dental screenings help detect early signs of oral cancer, gum disease, and other conditions linked to overall health, including heart disease and diabetes.  Please see the attached documents for additional preventive care recommendations.

## 2023-12-08 NOTE — Progress Notes (Signed)
 Subjective:   [name] is a 73 y.o. who presents for a Medicare Wellness preventive visit.  As a reminder, Annual Wellness Visits don't include a physical exam, and some assessments may be limited, especially if this visit is performed virtually. We may recommend an in-person follow-up visit with your provider if needed.  Visit Complete: Virtual I connected with  [name] on 12/08/23 by a audio enabled telemedicine application and verified that I am speaking with the correct person using two identifiers.  Patient Location: Home  Provider Location: Home Office  I discussed the limitations of evaluation and management by telemedicine. The patient expressed understanding and agreed to proceed.  Vital Signs: Because this visit was a virtual/telehealth visit, some criteria may be missing or patient reported. Any vitals not documented were not able to be obtained and vitals that have been documented are patient reported.  VideoDeclined- This patient declined Librarian, academic. Therefore the visit was completed with audio only.  Persons Participating in Visit: Patient.  AWV Questionnaire: No: Patient Medicare AWV questionnaire was not completed prior to this visit.  Cardiac Risk Factors include: advanced age (>39men, >89 women);hypertension;dyslipidemia     Objective:    Today's Vitals   12/08/23 1005  Weight: 162 lb (73.5 kg)  Height: 5' 4 (1.626 m)   Body mass index is 27.81 kg/m.     12/08/2023   10:12 AM 12/03/2022    8:41 AM 04/15/2022    8:18 AM 11/19/2021    8:25 AM 07/01/2020   11:17 AM 06/20/2019    1:08 PM 05/16/2018    9:04 AM  Advanced Directives  Does Patient Have a Medical Advance Directive? No No No No No No No  Does patient want to make changes to medical advance directive?      No - Patient declined   Would patient like information on creating a medical advance directive?  No - Patient declined  No - Patient declined No  - Patient declined No - Patient declined No - Patient declined      Data saved with a previous flowsheet row definition    Current Medications (verified) Outpatient Encounter Medications as of 12/08/2023  Medication Sig   Calcium  Carbonate-Vitamin D (OYSTER SHELL CALCIUM /VITAMIN D) 250-3.125 MG-MCG TABS    calcium -vitamin D (OSCAL WITH D) 500-5 MG-MCG tablet Take 1 tablet by mouth 2 (two) times daily.   gabapentin  (NEURONTIN ) 100 MG capsule Take 1 capsule (100 mg total) by mouth daily.   hydrochlorothiazide  (HYDRODIURIL ) 25 MG tablet TAKE 1 TABLET BY MOUTH DAILY   HYDROcodone -acetaminophen  (NORCO/VICODIN) 5-325 MG tablet    lidocaine  (LIDODERM ) 5 % Place 1 patch onto the skin daily. Remove & Discard patch within 12 hours or as directed by MD   methocarbamol  (ROBAXIN ) 500 MG tablet Take 0.5-1 tablets (250-500 mg total) by mouth every 8 (eight) hours as needed for muscle spasms.   Misc. Devices [facility]) MISC 1 Device by Does not apply route as needed. Needed for low back pain, history of lumbar surgery, rollator insufficient for walking due to bursitis and back pain.   Multiple Vitamins-Minerals (MULTIVITAMIN WOMEN 50+ PO) Take 1 tablet by mouth daily.   simvastatin  (ZOCOR ) 40 MG tablet TAKE 1 TABLET EVERY DAY   VALIUM  5 MG tablet 1-2 PO 30-45 minutes prior to procedure (Patient taking differently: Take 5 mg by mouth as needed. Take only when has her injection every 3 months.)   HYDROcodone -acetaminophen  (NORCO) 10-325 MG tablet Take 1  tablet by mouth every 8 (eight) hours as needed (pain).    No facility-administered encounter medications on file as of 12/08/2023.    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   Chronic bronchitis (HCC)    when I get a cold I normally get bronchitis (01/30/2013)   Hepatitis C    treated and states negative now on 08/04/21   Hyperlipidemia    Hypertension     Past Surgical History:  Procedure Laterality Date   ABDOMINAL HYSTERECTOMY  10/10/1988   BACK SURGERY     x5   CHOLECYSTECTOMY  10/10/1988   COLONOSCOPY  06/2015   JOINT REPLACEMENT Left 01/24/2013   hip   LUMBAR SPINE SURGERY  2010; 2011   tightened up some screws (01/30/2013)   POSTERIOR LUMBAR FUSION  2006 X 2; 2008   TOTAL HIP ARTHROPLASTY Left 01/24/2013   Procedure: LEFT TOTAL HIP ARTHROPLASTY ANTERIOR APPROACH;  Surgeon: Lonni CINDERELLA Poli, MD;  Location: [facility] OR;  Service: Orthopedics;  Laterality: Left;   TUBAL LIGATION  10/10/1968   Family History  Problem Relation Age of Onset   Esophageal cancer Mother    Breast cancer Sister    Prostate cancer Brother    Colon cancer Neg Hx    Rectal cancer Neg Hx    Stomach cancer Neg Hx    Colon polyps Neg Hx    Social History   Socioeconomic History   Marital status: Widowed    Spouse name: Not on file   Number of children: 2   Years of education: Not on file   Highest education level: GED or equivalent  Occupational History   Occupation: retired  Tobacco Use   Smoking status: Former    Current packs/day: 0.00    Average packs/day: 1 pack/day for 45.0 years (45.0 ttl pk-yrs)    Types: Cigarettes    Start date: 05/25/1970    Quit date: 05/25/2015    Years since quitting: 8.5   Smokeless tobacco: Never  Vaping Use   Vaping status: Never Used  Substance and Sexual Activity   Alcohol  use: No    Alcohol /week: 0.0 standard drinks of alcohol     Comment: 01/30/2013 used to drink beer; none since ~ 1989   Drug use: Never   Sexual activity: Yes    Birth control/protection: Post-menopausal  Other Topics Concern   Not on file  Social History Narrative   Great nephew lives with patient/2025   Social Drivers of Health   Financial Resource Strain: Low Risk  (12/08/2023)   Overall Financial Resource Strain (CARDIA)    Difficulty of Paying Living Expenses: Not very hard  Food Insecurity: No Food Insecurity  (12/08/2023)   Hunger Vital Sign    Worried About Running Out of Food in the Last Year: Never true    Ran Out of Food in the Last Year: Never true  Transportation Needs: No Transportation Needs (12/08/2023)   PRAPARE - Administrator, Civil Service (Medical): No    Lack of Transportation (Non-Medical): No  Physical Activity: Inactive (12/08/2023)   Exercise Vital Sign    Days of Exercise per Week: 0 days    Minutes of Exercise per Session: 0 min  Stress: No Stress Concern Present (12/08/2023)   [employer] of Occupational Health - Occupational Stress Questionnaire    Feeling of Stress: Not at all  Social Connections: Moderately Integrated (12/08/2023)  Social Connection and Isolation Panel    Frequency of Communication with Friends and Family: More than three times a week    Frequency of Social Gatherings with Friends and Family: More than three times a week    Attends Religious Services: More than 4 times per year    Active Member of [employer] or Organizations: Yes    Attends Banker Meetings: More than 4 times per year    Marital Status: Widowed    Tobacco Counseling Counseling given: Not Answered    Clinical Intake:  Pre-visit preparation completed: Yes  Pain : No/denies pain     BMI - recorded: 27.81 Nutritional Status: BMI 25 -29 Overweight Nutritional Risks: None Diabetes: No  Lab Results  Component Value Date   HGBA1C 6.3 09/24/2023   HGBA1C 6.2 03/12/2023   HGBA1C 6.2 09/07/2022     How often do you need to have someone help you when you read instructions, pamphlets, or other written materials from your doctor or pharmacy?: 1 - Never  Interpreter Needed?: No  Information entered by :: Harden Bramer, RMA   Activities of Daily Living     12/08/2023   10:07 AM  In your present state of health, do you have any difficulty performing the following activities:  Hearing? 0  Vision? 0  Difficulty concentrating or making  decisions? 0  Walking or climbing stairs? 0  Dressing or bathing? 0  Doing errands, shopping? 0  Preparing Food and eating ? N  Using the Toilet? N  In the past six months, have you accidently leaked urine? N  Do you have problems with loss of bowel control? N  Managing your Medications? N  Managing your Finances? N  Housekeeping or managing your Housekeeping? N    Patient Care Team: Levora Reyes SAUNDERS, MD as PCP - General (Family Medicine) Paul Barrio, OD as Referring Physician (Optometry) Tonette Ferebee Desai, Sarah, PA-C as Physician Assistant (Neurosurgery) Imaging, [facility] (Diagnostic Radiology) Darlis Deatrice RAMAN, MD as Consulting Physician (Pain Medicine)  I have updated your Care Teams any recent Medical Services you may have received from other providers in the past year.     Assessment:   This is a routine wellness examination for Merrisa.  Hearing/Vision screen Hearing Screening - Comments:: Denies hearing difficulties   Vision Screening - Comments:: Wears eyeglasses/[facility]/per pt-up to date   Goals Addressed   None    Depression Screen     12/08/2023   10:19 AM 09/24/2023    9:05 AM 03/12/2023    7:55 AM 12/03/2022    8:43 AM 09/07/2022    8:26 AM 03/09/2022    8:38 AM 12/17/2021    2:08 PM  PHQ 2/9 Scores  PHQ - 2 Score 0 0 0 0 0 0 0  PHQ- 9 Score 0 0 0 0 0 0 0    Fall Risk     12/08/2023   10:14 AM 09/24/2023    9:05 AM 03/12/2023    7:55 AM 12/03/2022    8:39 AM 09/07/2022    8:25 AM  Fall Risk   Falls in the past year? 0 0 0 0 0  Number falls in past yr: 0 0 0 0 0  Injury with Fall? 0 0 0 0 0  Risk for fall due to :  No Fall Risks   No Fall Risks  Follow up Falls evaluation completed;Falls prevention discussed Falls evaluation completed  Falls evaluation completed;Education provided;Falls  prevention discussed Falls evaluation completed    MEDICARE RISK AT HOME:  Medicare Risk at Home Any stairs  in or around the home?: Yes (oustide in front) If so, are there any without handrails?: No Home free of loose throw rugs in walkways, pet beds, electrical cords, etc?: Yes Adequate lighting in your home to reduce risk of falls?: Yes Life alert?: No Use of a cane, walker or w/c?: Yes (uses a cane, walker and w/c) Grab bars in the bathroom?: Yes Shower chair or bench in shower?: Yes Elevated toilet seat or a handicapped toilet?: Yes  TIMED UP AND GO:  Was the test performed?  No  Cognitive Function: 6CIT completed        12/08/2023   10:15 AM 12/03/2022    8:45 AM 11/19/2021    8:26 AM 09/01/2021    9:26 AM 06/20/2019    1:08 PM  6CIT Screen  What Year? 0 points 0 points 0 points 0 points 0 points  What month? 0 points 0 points 0 points 0 points 0 points  What time? 0 points 0 points 0 points 0 points 0 points  Count back from 20 0 points 0 points 0 points 0 points 0 points  Months in reverse 2 points 4 points 0 points 0 points 2 points  Repeat phrase 0 points 0 points 0 points 0 points 2 points  Total Score 2 points 4 points 0 points 0 points 4 points    Immunizations Immunization History  Administered Date(s) Administered   Fluad Quad(high Dose 65+) 12/17/2021   Fluad Trivalent(High Dose 65+) 03/12/2023   Influenza Split 11/10/2011, 10/25/2014   Influenza,inj,Quad PF,6+ Mos 12/06/2012, 11/10/2014, 10/15/2016   Influenza-Unspecified 10/10/2013, 11/05/2015, 10/19/2017, 10/07/2018   Moderna Sars-Covid-2 Vaccination 03/25/2019, 04/22/2019   PFIZER(Purple Top)SARS-COV-2 Vaccination 12/05/2019   Pneumococcal Conjugate-13 01/18/2015, 10/19/2017   Pneumococcal Polysaccharide-23 10/24/2018   Pneumococcal-Unspecified 12/15/2004   Tdap 09/02/2006, 11/05/2015   Zoster Recombinant(Shingrix) 08/06/2016, 10/17/2016   Zoster, Live 11/25/2012    Screening Tests Health Maintenance  Topic Date Due   Lung Cancer Screening  10/27/2014   Influenza Vaccine  09/10/2023   Mammogram   11/26/2024   Medicare Annual Wellness (AWV)  12/07/2024   DTaP/Tdap/Td (3 - Td or Tdap) 11/04/2025   Colonoscopy  08/27/2026   Pneumococcal Vaccine: 50+ Years  Completed   DEXA SCAN  Completed   Hepatitis C Screening  Completed   Zoster Vaccines- Shingrix  Completed   Meningococcal B Vaccine  Aged Out   COVID-19 Vaccine  Discontinued    Health Maintenance Items Addressed: See Nurse Notes at the end of this note  Additional Screening:  Vision Screening: Recommended annual ophthalmology exams for early detection of glaucoma and other disorders of the eye. Is the patient up to date with their annual eye exam?  Yes  Who is the provider or what is the name of the office in which the patient attends annual eye exams? [facility]/Per pt-up to date.  Dental Screening: Recommended annual dental exams for proper oral hygiene  Community Resource Referral / Chronic Care Management: CRR required this visit?  No   CCM required this visit?  No   Plan:    I have personally reviewed and noted the following in the patient's chart:   Medical and social history Use of alcohol , tobacco or illicit drugs  Current medications and supplements including opioid prescriptions. Patient is currently taking opioid prescriptions. Information provided to patient regarding non-opioid alternatives. Patient advised to discuss  non-opioid treatment plan with their provider. Functional ability and status Nutritional status Physical activity Advanced directives List of other physicians Hospitalizations, surgeries, and ER visits in previous 12 months Vitals Screenings to include cognitive, depression, and falls Referrals and appointments  In addition, I have reviewed and discussed with patient certain preventive protocols, quality metrics, and best practice recommendations. A written personalized care plan for preventive services as well as general preventive health recommendations were provided  to patient.   Karessa Onorato L Annastyn Silvey, CMA   12/08/2023   After Visit Summary: (MyChart) Due to this being a telephonic visit, the after visit summary with patients personalized plan was offered to patient via MyChart   Notes: Patient is requesting a signed handicap sticker form.  Patient stated that she would come to the office to pick it up soon.  Please advise.  She had no other concerns to address today.

## 2023-12-10 ENCOUNTER — Telehealth: Payer: Self-pay | Admitting: Family Medicine

## 2023-12-10 NOTE — Telephone Encounter (Signed)
 See notes from wellness exam, requested handicap placard form.  Chronic back pain, postlaminectomy syndrome, lumbar radiculopathy, handicap placard completed, ready for pickup.

## 2023-12-10 NOTE — Telephone Encounter (Signed)
 Filled out form  Called patient to let her know it was ready. She would like it mailed  I have sent that out via mail  Scanned copy to patients chart

## 2023-12-29 ENCOUNTER — Other Ambulatory Visit: Payer: Self-pay | Admitting: Family

## 2023-12-29 DIAGNOSIS — I10 Essential (primary) hypertension: Secondary | ICD-10-CM

## 2024-01-04 ENCOUNTER — Other Ambulatory Visit: Payer: Self-pay | Admitting: Family Medicine

## 2024-01-04 ENCOUNTER — Telehealth: Payer: Self-pay | Admitting: Family Medicine

## 2024-01-04 DIAGNOSIS — I10 Essential (primary) hypertension: Secondary | ICD-10-CM

## 2024-01-04 NOTE — Telephone Encounter (Signed)
 Pt is update on pneumonia 10/23/2024 Pt is asking for PCP advice on RSV vaccine.  Pt has been called and notified Pt states she will wait to see what Dr.Greene would like her to do

## 2024-01-04 NOTE — Telephone Encounter (Signed)
 Immunization History  Administered Date(s) Administered   Fluad Quad(high Dose 65+) 12/17/2021   Fluad Trivalent(High Dose 65+) 03/12/2023   Influenza Split 11/10/2011, 10/25/2014   Influenza,inj,Quad PF,6+ Mos 12/06/2012, 11/10/2014, 10/15/2016   Influenza-Unspecified 10/10/2013, 11/05/2015, 10/19/2017, 10/07/2018   Moderna Sars-Covid-2 Vaccination 03/25/2019, 04/22/2019   PFIZER(Purple Top)SARS-COV-2 Vaccination 12/05/2019   Pneumococcal Conjugate-13 01/18/2015, 10/19/2017   Pneumococcal Polysaccharide-23 10/24/2018   Pneumococcal-Unspecified 12/15/2004   Tdap 09/02/2006, 11/05/2015   Zoster Recombinant(Shingrix) 08/06/2016, 10/17/2016   Zoster, Live 11/25/2012    She certainly can get the RSV vaccine at her pharmacy.  Also the last pneumonia vaccine she received was in September 2020 from what I can see.  If she would like to have an updated pneumonia vaccine that is fine as well.  Thanks

## 2024-01-04 NOTE — Telephone Encounter (Signed)
 Copied from CRM [id number]. Topic: Clinical - Medical Advice >> Jan 04, 2024 12:29 PM Alfonso ORN wrote: Reason for CRM: pt was told by her pharmacy that she is due for her pnemonia and respiratory shot and attempted to schedule. Pt wants to clarify if she should get either shot because she can't remember getting them previously. Please contact pt to confirm

## 2024-01-04 NOTE — Telephone Encounter (Signed)
 Copied from CRM #[id number]. Topic: Clinical - Medication Refill >> Jan 04, 2024  3:08 PM Drema MATSU wrote: Medication: hydrochlorothiazide  (HYDRODIURIL ) 25 MG tablet   Has the patient contacted their pharmacy? Yes (Agent: If no, request that the patient contact the pharmacy for the refill. If patient does not wish to contact the pharmacy document the reason why and proceed with request.)needs a new prescription (Agent: If yes, when and what did the pharmacy advise?)  This is the patient's preferred pharmacy:   [facility] - [place], [place] - [address] [place] [address] Phone: [phone] Fax: [phone]   Is this the correct pharmacy for this prescription? Yes If no, delete pharmacy and type the correct one.   Has the prescription been filled recently? Yes  Is the patient out of the medication? No  Has the patient been seen for an appointment in the last year OR does the patient have an upcoming appointment? Yes  Can we respond through MyChart? Yes  Agent: Please be advised that Rx refills may take up to 3 business days. We ask that you follow-up with your pharmacy.

## 2024-01-05 NOTE — Telephone Encounter (Signed)
Relayed message below and patient verbalized understanding.

## 2024-02-11 ENCOUNTER — Other Ambulatory Visit: Payer: Self-pay | Admitting: Family Medicine

## 2024-02-11 DIAGNOSIS — Z1231 Encounter for screening mammogram for malignant neoplasm of breast: Secondary | ICD-10-CM

## 2024-03-01 ENCOUNTER — Other Ambulatory Visit: Payer: Self-pay | Admitting: Family Medicine

## 2024-03-01 ENCOUNTER — Ambulatory Visit
Admission: RE | Admit: 2024-03-01 | Discharge: 2024-03-01 | Disposition: A | Source: Ambulatory Visit | Attending: Family Medicine | Admitting: Family Medicine

## 2024-03-01 DIAGNOSIS — Z1231 Encounter for screening mammogram for malignant neoplasm of breast: Secondary | ICD-10-CM

## 2024-03-01 DIAGNOSIS — I10 Essential (primary) hypertension: Secondary | ICD-10-CM

## 2024-03-27 ENCOUNTER — Ambulatory Visit: Admitting: Family Medicine

## 2024-12-14 ENCOUNTER — Ambulatory Visit
